# Patient Record
Sex: Female | Born: 1980 | Race: Black or African American | Hispanic: No | Marital: Single | Smoking: Never smoker
Health system: Southern US, Community
[De-identification: ages and names within clinical notes are randomized; demographics above are authoritative.]

## PROBLEM LIST (undated history)

## (undated) DIAGNOSIS — I82409 Acute embolism and thrombosis of unspecified deep veins of unspecified lower extremity: Secondary | ICD-10-CM

## (undated) DIAGNOSIS — E1110 Type 2 diabetes mellitus with ketoacidosis without coma: Secondary | ICD-10-CM

## (undated) DIAGNOSIS — N7092 Oophoritis, unspecified: Secondary | ICD-10-CM

## (undated) DIAGNOSIS — F99 Mental disorder, not otherwise specified: Secondary | ICD-10-CM

## (undated) DIAGNOSIS — K7689 Other specified diseases of liver: Secondary | ICD-10-CM

## (undated) DIAGNOSIS — IMO0002 Reserved for concepts with insufficient information to code with codable children: Secondary | ICD-10-CM

## (undated) DIAGNOSIS — N10 Acute tubulo-interstitial nephritis: Secondary | ICD-10-CM

## (undated) DIAGNOSIS — K5289 Other specified noninfective gastroenteritis and colitis: Secondary | ICD-10-CM

## (undated) DIAGNOSIS — I10 Essential (primary) hypertension: Secondary | ICD-10-CM

## (undated) DIAGNOSIS — N390 Urinary tract infection, site not specified: Secondary | ICD-10-CM

## (undated) DIAGNOSIS — J869 Pyothorax without fistula: Secondary | ICD-10-CM

## (undated) DIAGNOSIS — N739 Female pelvic inflammatory disease, unspecified: Secondary | ICD-10-CM

## (undated) HISTORY — PX: CHOLECYSTECTOMY: SHX55

---

## 2008-11-14 HISTORY — PX: OTHER SURGICAL HISTORY: SHX169

## 2009-03-01 ENCOUNTER — Inpatient Hospital Stay (HOSPITAL_COMMUNITY): Admission: EM | Admit: 2009-03-01 | Discharge: 2009-03-10 | Payer: Self-pay | Admitting: Emergency Medicine

## 2009-03-01 DIAGNOSIS — K7689 Other specified diseases of liver: Secondary | ICD-10-CM | POA: Insufficient documentation

## 2009-03-01 DIAGNOSIS — D509 Iron deficiency anemia, unspecified: Secondary | ICD-10-CM

## 2009-03-01 DIAGNOSIS — N739 Female pelvic inflammatory disease, unspecified: Secondary | ICD-10-CM

## 2009-03-01 DIAGNOSIS — E1165 Type 2 diabetes mellitus with hyperglycemia: Secondary | ICD-10-CM

## 2009-03-01 HISTORY — DX: Other specified diseases of liver: K76.89

## 2009-03-01 HISTORY — DX: Female pelvic inflammatory disease, unspecified: N73.9

## 2009-03-03 ENCOUNTER — Ambulatory Visit: Payer: Self-pay | Admitting: Vascular Surgery

## 2009-03-04 ENCOUNTER — Encounter (INDEPENDENT_AMBULATORY_CARE_PROVIDER_SITE_OTHER): Payer: Self-pay | Admitting: Internal Medicine

## 2009-03-16 DIAGNOSIS — J869 Pyothorax without fistula: Secondary | ICD-10-CM

## 2009-03-16 HISTORY — DX: Pyothorax without fistula: J86.9

## 2009-03-17 ENCOUNTER — Inpatient Hospital Stay (HOSPITAL_COMMUNITY): Admission: EM | Admit: 2009-03-17 | Discharge: 2009-03-18 | Payer: Self-pay | Admitting: Emergency Medicine

## 2009-03-18 ENCOUNTER — Encounter (INDEPENDENT_AMBULATORY_CARE_PROVIDER_SITE_OTHER): Payer: Self-pay | Admitting: Internal Medicine

## 2009-03-18 ENCOUNTER — Ambulatory Visit: Payer: Self-pay | Admitting: Infectious Diseases

## 2009-03-30 ENCOUNTER — Encounter (INDEPENDENT_AMBULATORY_CARE_PROVIDER_SITE_OTHER): Payer: Self-pay | Admitting: Internal Medicine

## 2009-03-30 ENCOUNTER — Telehealth (INDEPENDENT_AMBULATORY_CARE_PROVIDER_SITE_OTHER): Payer: Self-pay | Admitting: Internal Medicine

## 2009-03-30 ENCOUNTER — Ambulatory Visit: Payer: Self-pay | Admitting: Nurse Practitioner

## 2009-03-30 LAB — CONVERTED CEMR LAB
INR: 1.4 (ref 0.0–1.5)
Prothrombin Time: 17.4 s — ABNORMAL HIGH (ref 11.6–15.2)

## 2009-04-02 ENCOUNTER — Ambulatory Visit: Payer: Self-pay | Admitting: Internal Medicine

## 2009-04-02 ENCOUNTER — Ambulatory Visit: Payer: Self-pay | Admitting: *Deleted

## 2009-04-02 DIAGNOSIS — I82409 Acute embolism and thrombosis of unspecified deep veins of unspecified lower extremity: Secondary | ICD-10-CM

## 2009-04-02 HISTORY — DX: Acute embolism and thrombosis of unspecified deep veins of unspecified lower extremity: I82.409

## 2009-04-02 LAB — CONVERTED CEMR LAB: Blood Glucose, Fingerstick: 234

## 2009-04-06 ENCOUNTER — Ambulatory Visit: Payer: Self-pay | Admitting: Internal Medicine

## 2009-04-14 HISTORY — PX: OVARIAN CYST SURGERY: SHX726

## 2009-04-14 HISTORY — PX: SMALL INTESTINE SURGERY: SHX150

## 2009-04-16 ENCOUNTER — Encounter (INDEPENDENT_AMBULATORY_CARE_PROVIDER_SITE_OTHER): Payer: Self-pay | Admitting: Internal Medicine

## 2009-04-16 LAB — CONVERTED CEMR LAB
Eosinophils Absolute: 0.2 10*3/uL (ref 0.0–0.7)
Eosinophils Relative: 2 % (ref 0–5)
HCT: 31.4 % — ABNORMAL LOW (ref 36.0–46.0)
Lymphocytes Relative: 20 % (ref 12–46)
MCHC: 30.3 g/dL (ref 30.0–36.0)
MCV: 87 fL (ref 78.0–100.0)
Monocytes Relative: 8 % (ref 3–12)
Neutro Abs: 5.9 10*3/uL (ref 1.7–7.7)
Neutrophils Relative %: 70 % (ref 43–77)
RDW: 16.5 % — ABNORMAL HIGH (ref 11.5–15.5)

## 2009-04-30 ENCOUNTER — Encounter (INDEPENDENT_AMBULATORY_CARE_PROVIDER_SITE_OTHER): Payer: Self-pay | Admitting: Internal Medicine

## 2009-05-11 ENCOUNTER — Telehealth (INDEPENDENT_AMBULATORY_CARE_PROVIDER_SITE_OTHER): Payer: Self-pay | Admitting: Internal Medicine

## 2009-05-12 ENCOUNTER — Emergency Department (HOSPITAL_COMMUNITY): Admission: EM | Admit: 2009-05-12 | Discharge: 2009-05-12 | Payer: Self-pay | Admitting: Emergency Medicine

## 2009-05-12 ENCOUNTER — Encounter (INDEPENDENT_AMBULATORY_CARE_PROVIDER_SITE_OTHER): Payer: Self-pay | Admitting: Internal Medicine

## 2009-05-22 ENCOUNTER — Emergency Department (HOSPITAL_COMMUNITY): Admission: EM | Admit: 2009-05-22 | Discharge: 2009-05-23 | Payer: Self-pay | Admitting: Emergency Medicine

## 2009-06-01 ENCOUNTER — Encounter (INDEPENDENT_AMBULATORY_CARE_PROVIDER_SITE_OTHER): Payer: Self-pay | Admitting: Internal Medicine

## 2009-06-16 ENCOUNTER — Encounter (INDEPENDENT_AMBULATORY_CARE_PROVIDER_SITE_OTHER): Payer: Self-pay | Admitting: Internal Medicine

## 2009-06-16 ENCOUNTER — Telehealth (INDEPENDENT_AMBULATORY_CARE_PROVIDER_SITE_OTHER): Payer: Self-pay | Admitting: Internal Medicine

## 2009-06-18 ENCOUNTER — Encounter (INDEPENDENT_AMBULATORY_CARE_PROVIDER_SITE_OTHER): Payer: Self-pay | Admitting: Internal Medicine

## 2009-06-19 ENCOUNTER — Encounter (INDEPENDENT_AMBULATORY_CARE_PROVIDER_SITE_OTHER): Payer: Self-pay | Admitting: Internal Medicine

## 2009-06-26 ENCOUNTER — Encounter (INDEPENDENT_AMBULATORY_CARE_PROVIDER_SITE_OTHER): Payer: Self-pay | Admitting: Internal Medicine

## 2009-06-29 ENCOUNTER — Telehealth (INDEPENDENT_AMBULATORY_CARE_PROVIDER_SITE_OTHER): Payer: Self-pay | Admitting: Internal Medicine

## 2009-07-03 ENCOUNTER — Encounter (INDEPENDENT_AMBULATORY_CARE_PROVIDER_SITE_OTHER): Payer: Self-pay | Admitting: Internal Medicine

## 2009-07-04 LAB — CONVERTED CEMR LAB
ALT: 13 units/L (ref 0–35)
AST: 10 units/L (ref 0–37)
AST: 13 units/L (ref 0–37)
AST: 11 units/L (ref 0–37)
Albumin: 4.1 g/dL (ref 3.5–5.2)
Alkaline Phosphatase: 85 units/L (ref 39–117)
BUN: 5 mg/dL — ABNORMAL LOW (ref 6–23)
Basophils Absolute: 0 10*3/uL (ref 0.0–0.1)
Basophils Absolute: 0 10*3/uL (ref 0.0–0.1)
Basophils Relative: 0 % (ref 0–1)
Bilirubin, Direct: 0.1 mg/dL (ref 0.0–0.3)
Calcium: 9.4 mg/dL (ref 8.4–10.5)
Chloride: 100 meq/L (ref 96–112)
Chloride: 102 meq/L (ref 96–112)
Creatinine, Ser: 0.61 mg/dL (ref 0.40–1.20)
Creatinine, Ser: 0.6 mg/dL (ref 0.40–1.20)
Eosinophils Relative: 1 % (ref 0–5)
Eosinophils Relative: 1 % (ref 0–5)
Eosinophils Relative: 1 % (ref 0–5)
Glucose, Bld: 184 mg/dL — ABNORMAL HIGH (ref 70–99)
Hemoglobin: 10.7 g/dL — ABNORMAL LOW (ref 12.0–15.0)
Hemoglobin: 10.7 g/dL — ABNORMAL LOW (ref 12.0–15.0)
Hemoglobin: 11.4 g/dL — ABNORMAL LOW (ref 12.0–15.0)
Indirect Bilirubin: 0.2 mg/dL (ref 0.0–0.9)
Lymphocytes Relative: 20 % (ref 12–46)
Lymphs Abs: 1.7 10*3/uL (ref 0.7–4.0)
MCHC: 30.7 g/dL (ref 30.0–36.0)
MCHC: 31.4 g/dL (ref 30.0–36.0)
MCHC: 32.6 g/dL (ref 30.0–36.0)
MCV: 85.3 fL (ref 78.0–100.0)
MCV: 85.5 fL (ref 78.0–100.0)
MCV: 83.9 fL (ref 78.0–100.0)
Monocytes Absolute: 0.5 10*3/uL (ref 0.1–1.0)
Monocytes Absolute: 0.6 10*3/uL (ref 0.1–1.0)
Monocytes Relative: 6 % (ref 3–12)
Monocytes Relative: 7 % (ref 3–12)
Neutro Abs: 6.6 10*3/uL (ref 1.7–7.7)
Neutro Abs: 5.8 10*3/uL (ref 1.7–7.7)
Neutrophils Relative %: 71 % (ref 43–77)
Neutrophils Relative %: 73 % (ref 43–77)
Platelets: 438 10*3/uL — ABNORMAL HIGH (ref 150–400)
RBC: 3.99 M/uL (ref 3.87–5.11)
RBC: 4.17 M/uL (ref 3.87–5.11)
RDW: 17.4 % — ABNORMAL HIGH (ref 11.5–15.5)
RDW: 16.6 % — ABNORMAL HIGH (ref 11.5–15.5)
Sodium: 139 meq/L (ref 135–145)
Total Bilirubin: 0.5 mg/dL (ref 0.3–1.2)
Total Bilirubin: 0.3 mg/dL (ref 0.3–1.2)
Total CK: 11 units/L (ref 7–177)
Total CK: 17 units/L (ref 7–177)
Total Protein: 8 g/dL (ref 6.0–8.3)
WBC: 7.8 10*3/uL (ref 4.0–10.5)
WBC: 9 10*3/uL (ref 4.0–10.5)
WBC: 8.6 10*3/uL (ref 4.0–10.5)

## 2009-07-16 ENCOUNTER — Telehealth (INDEPENDENT_AMBULATORY_CARE_PROVIDER_SITE_OTHER): Payer: Self-pay | Admitting: Internal Medicine

## 2009-07-17 ENCOUNTER — Encounter (INDEPENDENT_AMBULATORY_CARE_PROVIDER_SITE_OTHER): Payer: Self-pay | Admitting: Internal Medicine

## 2009-07-17 LAB — CONVERTED CEMR LAB
AST: 12 units/L (ref 0–37)
Alkaline Phosphatase: 92 units/L (ref 39–117)
BUN: 9 mg/dL (ref 6–23)
Basophils Absolute: 0 10*3/uL (ref 0.0–0.1)
Basophils Relative: 0 % (ref 0–1)
Bilirubin, Direct: 0.1 mg/dL (ref 0.0–0.3)
CO2: 22 meq/L (ref 19–32)
Chloride: 101 meq/L (ref 96–112)
Eosinophils Absolute: 0.1 10*3/uL (ref 0.0–0.7)
Hemoglobin: 11.5 g/dL — ABNORMAL LOW (ref 12.0–15.0)
MCHC: 31.3 g/dL (ref 30.0–36.0)
MCV: 87 fL (ref 78.0–100.0)
Monocytes Absolute: 0.5 10*3/uL (ref 0.1–1.0)
Platelets: 510 10*3/uL — ABNORMAL HIGH (ref 150–400)
Sodium: 139 meq/L (ref 135–145)
Total CK: 30 units/L (ref 7–177)
WBC: 7.1 10*3/uL (ref 4.0–10.5)

## 2009-07-21 ENCOUNTER — Encounter (INDEPENDENT_AMBULATORY_CARE_PROVIDER_SITE_OTHER): Payer: Self-pay | Admitting: Internal Medicine

## 2009-07-28 ENCOUNTER — Encounter (INDEPENDENT_AMBULATORY_CARE_PROVIDER_SITE_OTHER): Payer: Self-pay | Admitting: Internal Medicine

## 2009-07-31 ENCOUNTER — Encounter (INDEPENDENT_AMBULATORY_CARE_PROVIDER_SITE_OTHER): Payer: Self-pay | Admitting: Internal Medicine

## 2009-08-08 ENCOUNTER — Encounter (INDEPENDENT_AMBULATORY_CARE_PROVIDER_SITE_OTHER): Payer: Self-pay | Admitting: Internal Medicine

## 2009-08-09 ENCOUNTER — Encounter (INDEPENDENT_AMBULATORY_CARE_PROVIDER_SITE_OTHER): Payer: Self-pay | Admitting: Internal Medicine

## 2009-08-09 DIAGNOSIS — IMO0002 Reserved for concepts with insufficient information to code with codable children: Secondary | ICD-10-CM

## 2009-08-09 HISTORY — DX: Reserved for concepts with insufficient information to code with codable children: IMO0002

## 2009-08-10 ENCOUNTER — Encounter (INDEPENDENT_AMBULATORY_CARE_PROVIDER_SITE_OTHER): Payer: Self-pay | Admitting: Internal Medicine

## 2009-10-02 ENCOUNTER — Ambulatory Visit: Payer: Self-pay | Admitting: Internal Medicine

## 2009-10-02 DIAGNOSIS — K5289 Other specified noninfective gastroenteritis and colitis: Secondary | ICD-10-CM

## 2009-10-02 HISTORY — DX: Other specified noninfective gastroenteritis and colitis: K52.89

## 2009-10-07 LAB — CONVERTED CEMR LAB
Albumin: 4.7 g/dL (ref 3.5–5.2)
Alkaline Phosphatase: 91 units/L (ref 39–117)
BUN: 14 mg/dL (ref 6–23)
Basophils Absolute: 0 10*3/uL (ref 0.0–0.1)
Basophils Relative: 0 % (ref 0–1)
Creatinine, Ser: 0.82 mg/dL (ref 0.40–1.20)
Eosinophils Absolute: 0 10*3/uL (ref 0.0–0.7)
Eosinophils Relative: 1 % (ref 0–5)
Glucose, Bld: 100 mg/dL — ABNORMAL HIGH (ref 70–99)
HCT: 38.2 % (ref 36.0–46.0)
Hemoglobin: 12.6 g/dL (ref 12.0–15.0)
Lymphocytes Relative: 26 % (ref 12–46)
MCHC: 33 g/dL (ref 30.0–36.0)
MCV: 86.2 fL (ref 78.0–100.0)
Monocytes Absolute: 0.5 10*3/uL (ref 0.1–1.0)
Platelets: 310 10*3/uL (ref 150–400)
Potassium: 4.9 meq/L (ref 3.5–5.3)
RDW: 14.1 % (ref 11.5–15.5)

## 2009-11-12 ENCOUNTER — Ambulatory Visit: Payer: Self-pay | Admitting: Internal Medicine

## 2009-11-14 DIAGNOSIS — N739 Female pelvic inflammatory disease, unspecified: Secondary | ICD-10-CM

## 2009-11-14 HISTORY — DX: Female pelvic inflammatory disease, unspecified: N73.9

## 2009-11-16 LAB — CONVERTED CEMR LAB
C-Peptide: 4.19 ng/mL — ABNORMAL HIGH (ref 0.80–3.90)
Hgb A1c MFr Bld: 7.5 % — ABNORMAL HIGH (ref 4.6–6.1)

## 2009-11-29 ENCOUNTER — Encounter (INDEPENDENT_AMBULATORY_CARE_PROVIDER_SITE_OTHER): Payer: Self-pay | Admitting: Internal Medicine

## 2009-12-01 ENCOUNTER — Encounter (INDEPENDENT_AMBULATORY_CARE_PROVIDER_SITE_OTHER): Payer: Self-pay | Admitting: Internal Medicine

## 2009-12-11 ENCOUNTER — Inpatient Hospital Stay (HOSPITAL_COMMUNITY): Admission: EM | Admit: 2009-12-11 | Discharge: 2009-12-12 | Payer: Self-pay | Admitting: Emergency Medicine

## 2009-12-14 ENCOUNTER — Encounter (INDEPENDENT_AMBULATORY_CARE_PROVIDER_SITE_OTHER): Payer: Self-pay | Admitting: Internal Medicine

## 2009-12-15 ENCOUNTER — Encounter (INDEPENDENT_AMBULATORY_CARE_PROVIDER_SITE_OTHER): Payer: Self-pay | Admitting: Internal Medicine

## 2009-12-24 ENCOUNTER — Telehealth (INDEPENDENT_AMBULATORY_CARE_PROVIDER_SITE_OTHER): Payer: Self-pay | Admitting: Internal Medicine

## 2010-02-16 ENCOUNTER — Inpatient Hospital Stay (HOSPITAL_COMMUNITY): Admission: EM | Admit: 2010-02-16 | Discharge: 2010-02-22 | Payer: Self-pay | Admitting: Emergency Medicine

## 2010-02-16 ENCOUNTER — Encounter (INDEPENDENT_AMBULATORY_CARE_PROVIDER_SITE_OTHER): Payer: Self-pay | Admitting: Internal Medicine

## 2010-02-16 DIAGNOSIS — N10 Acute tubulo-interstitial nephritis: Secondary | ICD-10-CM

## 2010-02-16 HISTORY — DX: Acute pyelonephritis: N10

## 2010-08-24 IMAGING — CR DG CHEST 2V
3 series · 3 of 3 positions shown · non-contrast
Comparison: None

CLINICAL DATA: Evaluate for pulmonary embolism

CHEST - 2 VIEW

[w chest lat * (1 of 2)]
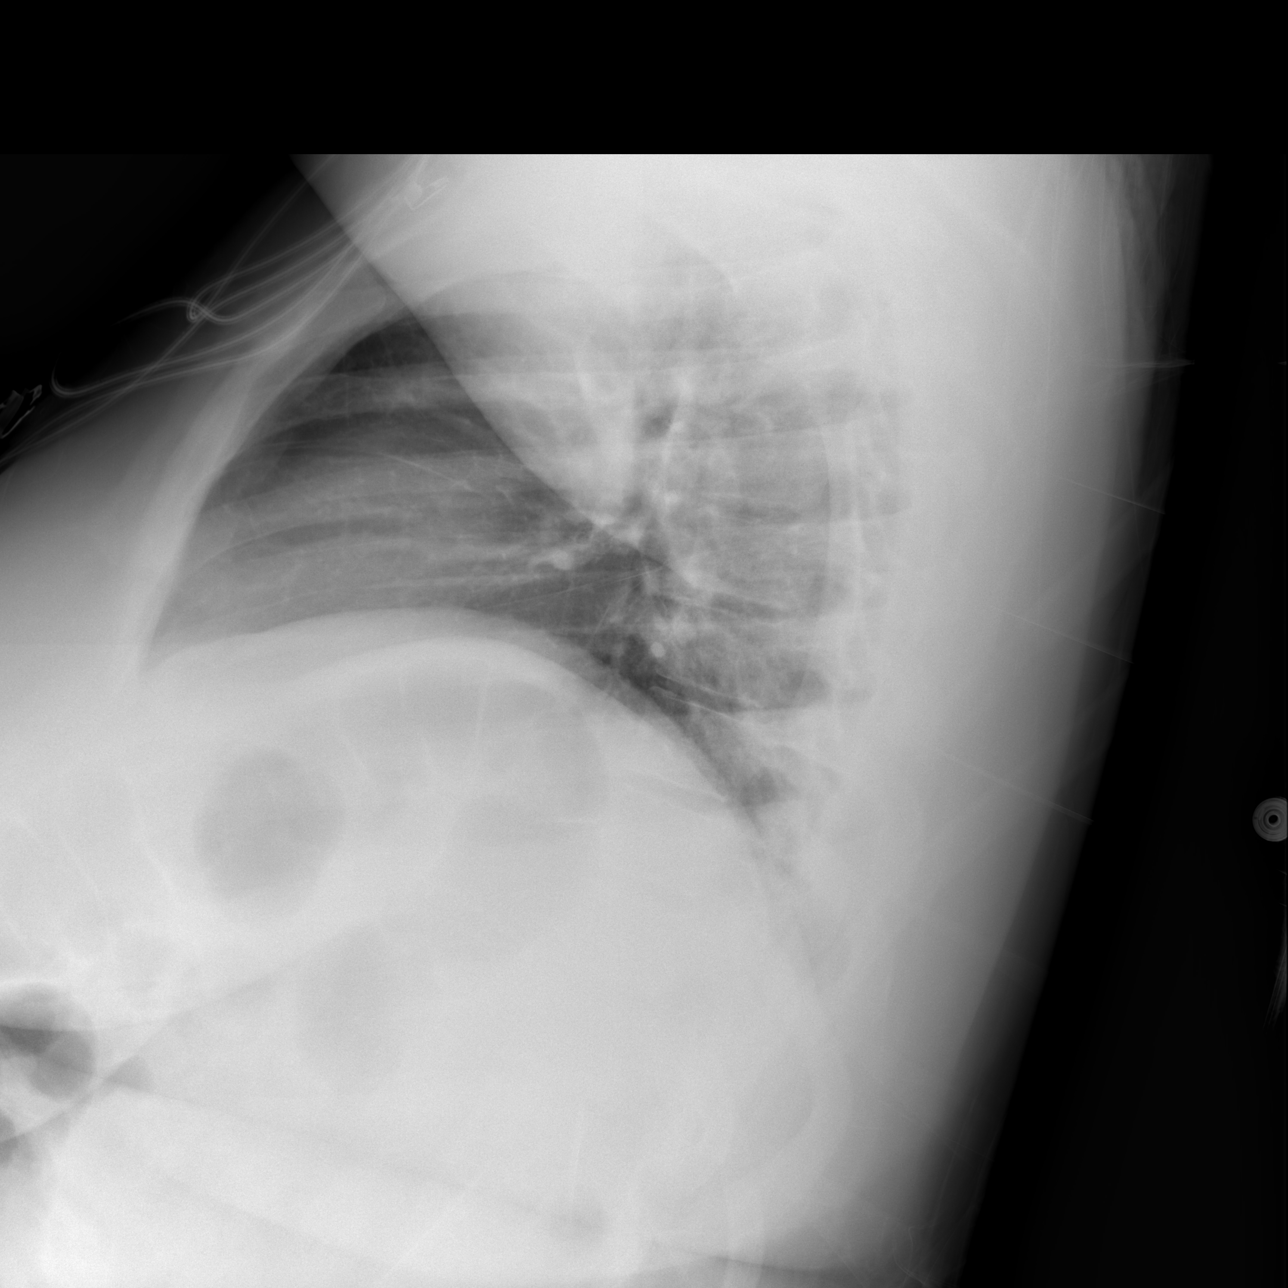

[w chest lat * (2 of 2)]
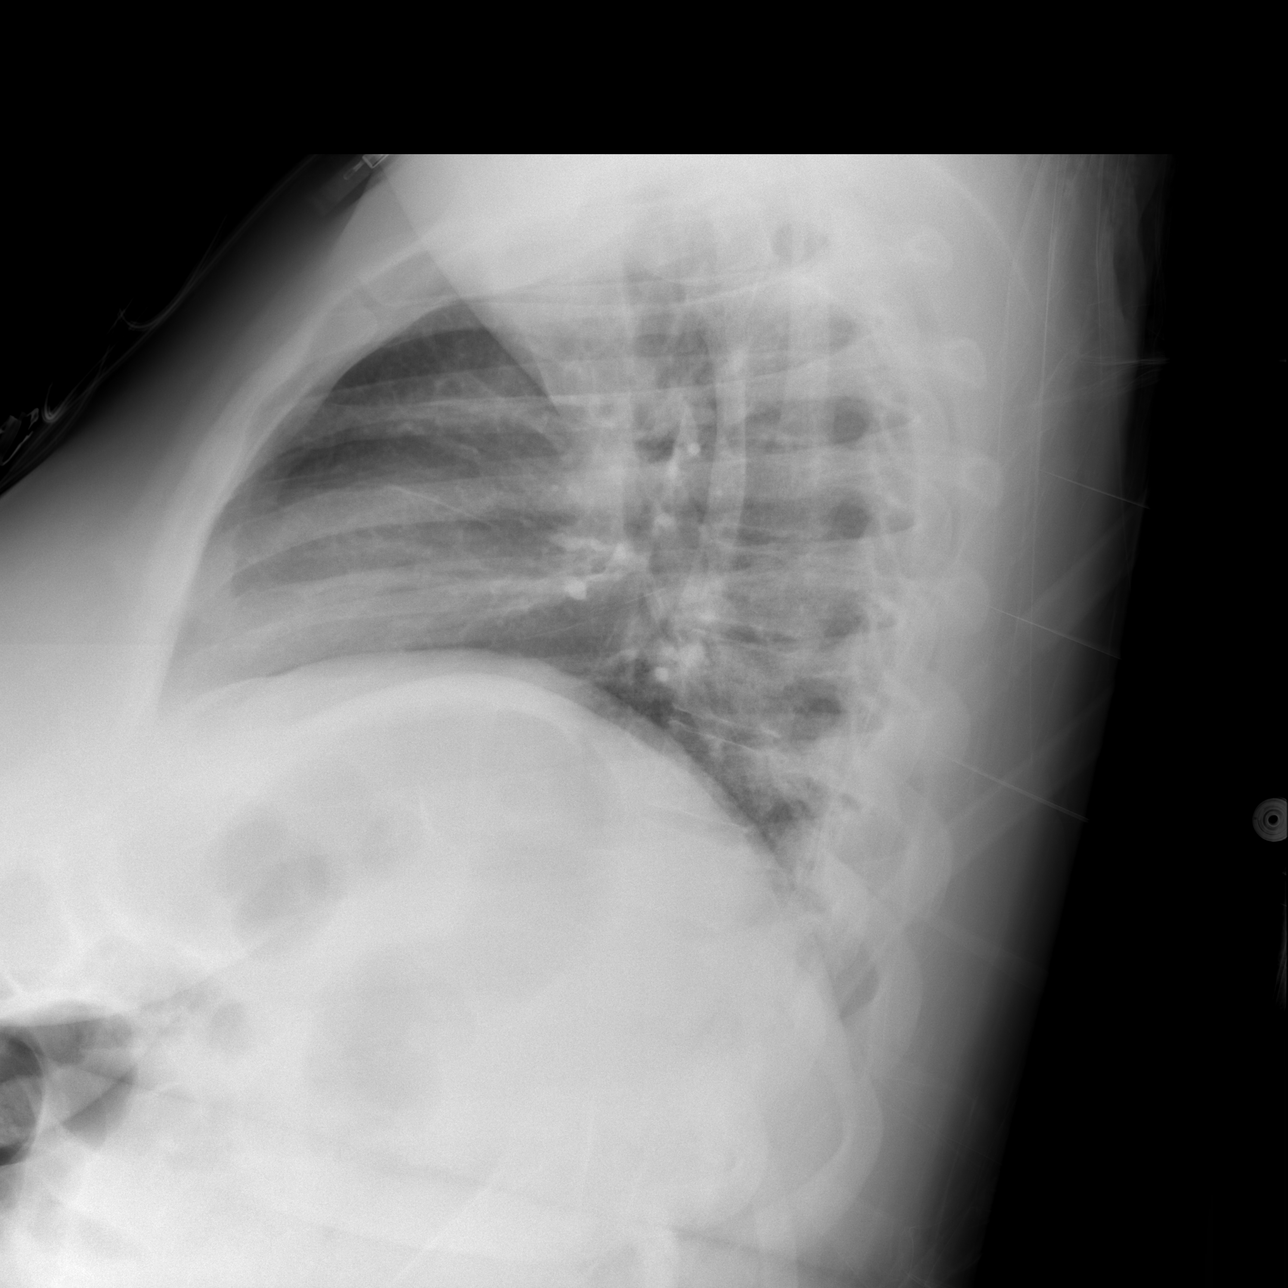

[view not recorded]
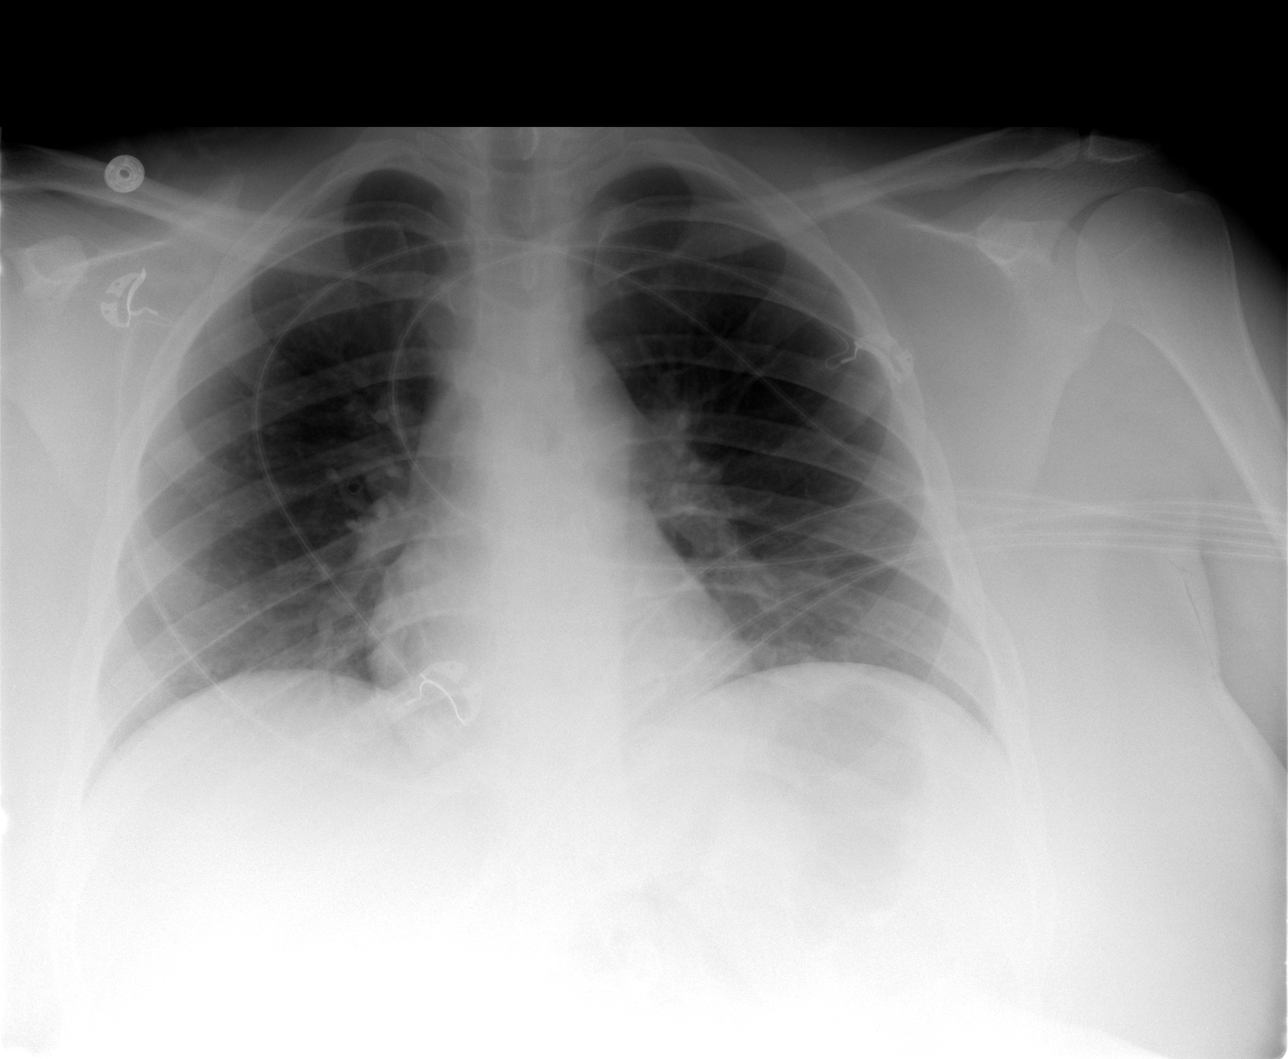

[3 of 3 positions shown; findings below may reference images not displayed]

FINDINGS: Heart size is normal.

There is no pleural effusion or pulmonary edema.

There is no airspace densities identified.
IMPRESSION: 1.  No acute cardiopulmonary abnormalities.

## 2010-08-27 IMAGING — XA IR [PERSON_NAME]/[PERSON_NAME]
1 series · 14 of 23 positions shown · non-contrast
Comparison: none

INDICATION: 27-year-old female with a right lower extremity DVT.
The patient has a pelvic mass and the DVT may be secondary to
compression on the right iliac veins.  The patient is anemic and
refuses blood transfusions.  The patient is no longer a candidate
for anticoagulation.

[Series 1000: run · 0.23mm/px · 14 of 23 slices shown]
[im 1/23]
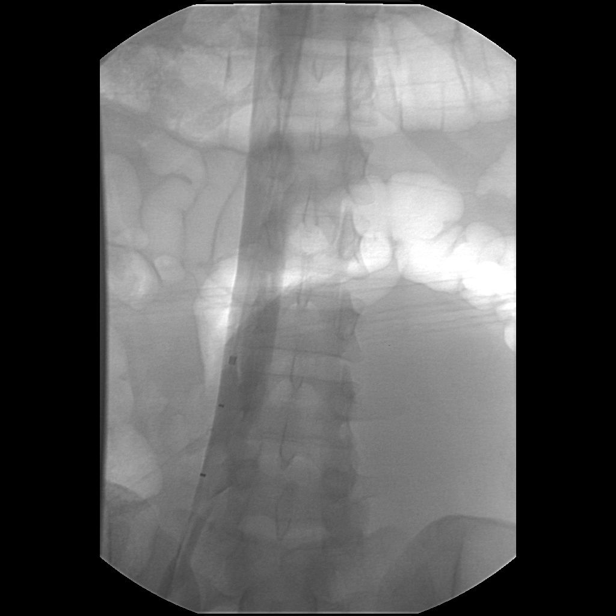
[im 3/23]
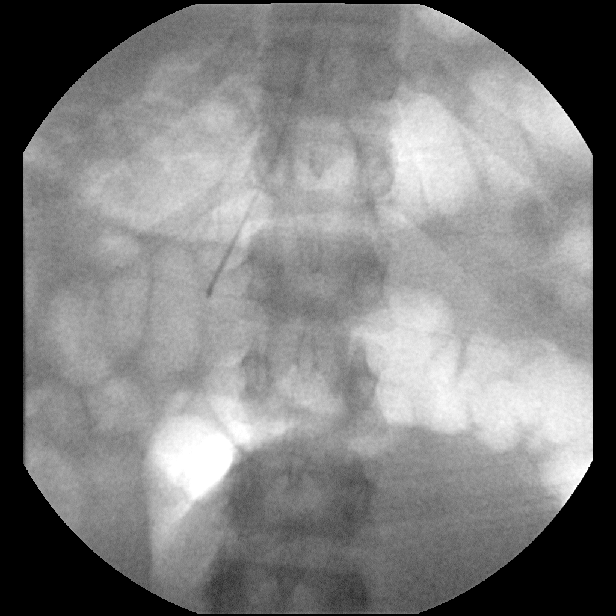
[im 5/23]
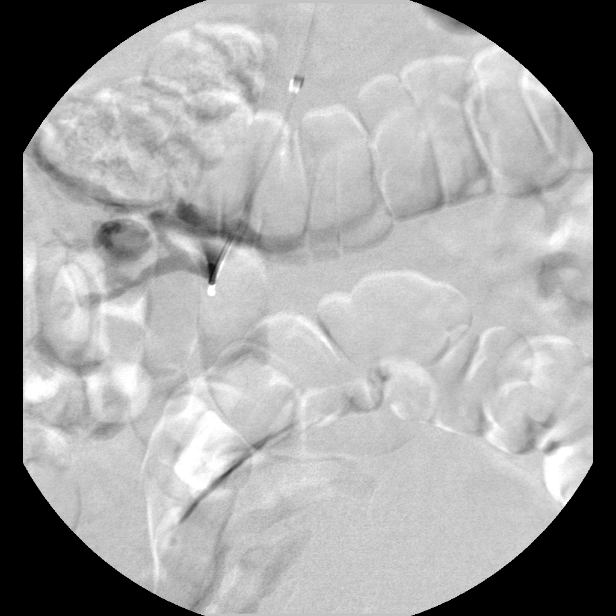
[im 6/23]
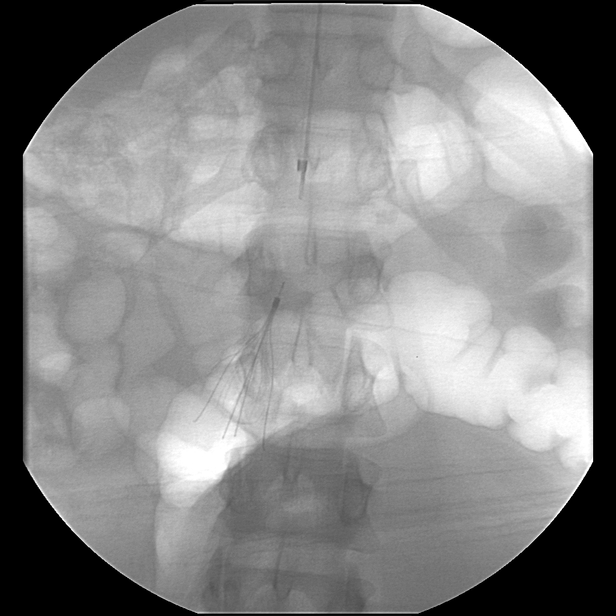
[im 8/23]
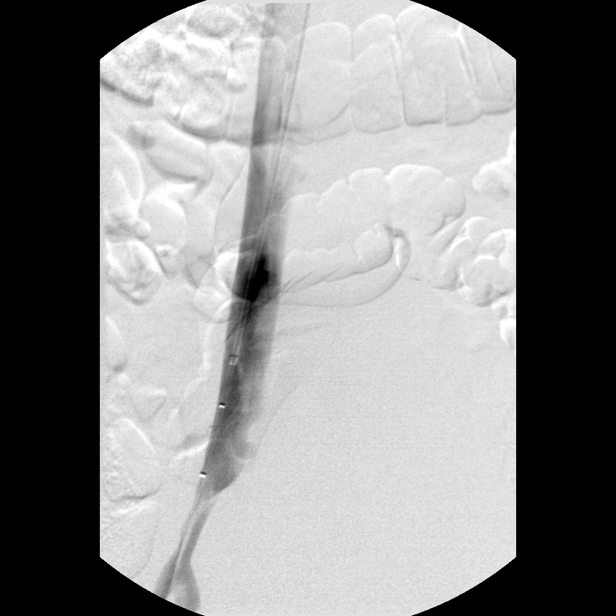
[im 10/23]
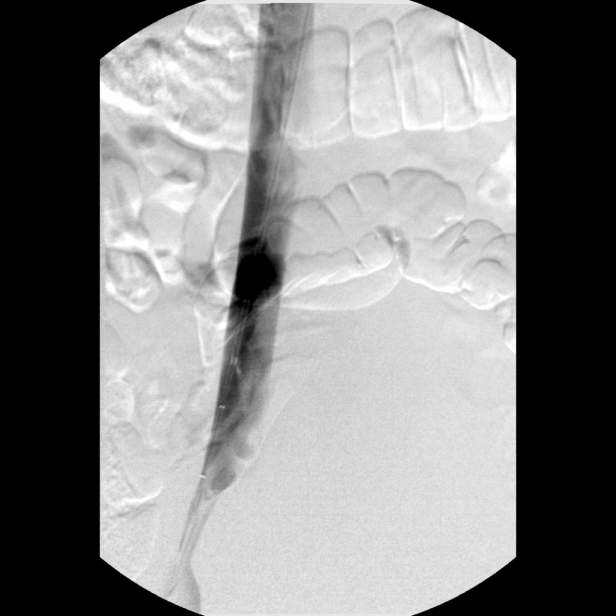
[im 11/23]
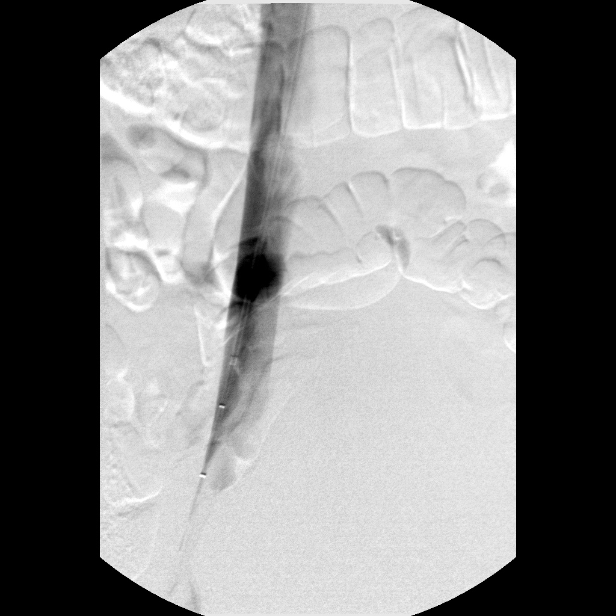
[im 13/23]
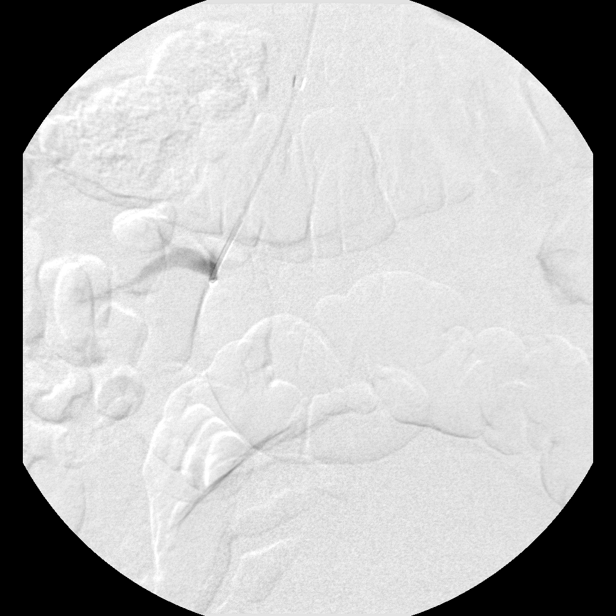
[im 14/23]
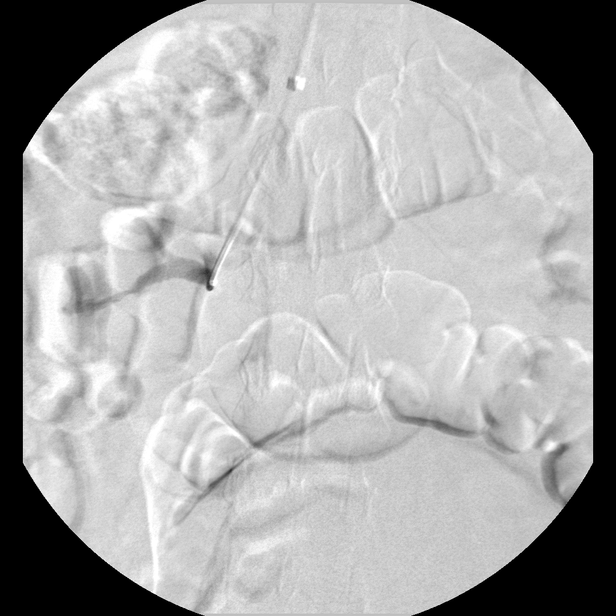
[im 16/23]
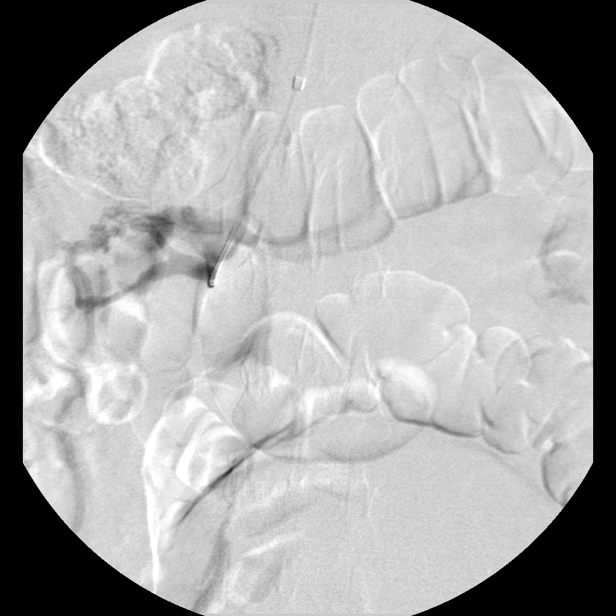
[im 18/23]
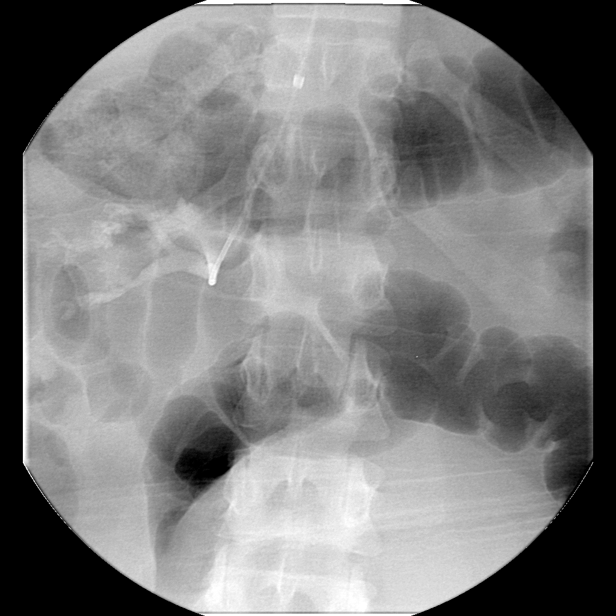
[im 19/23]
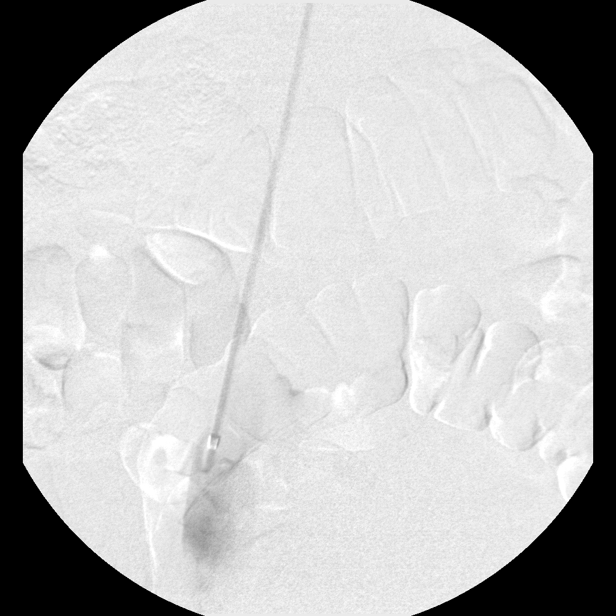
[im 21/23]
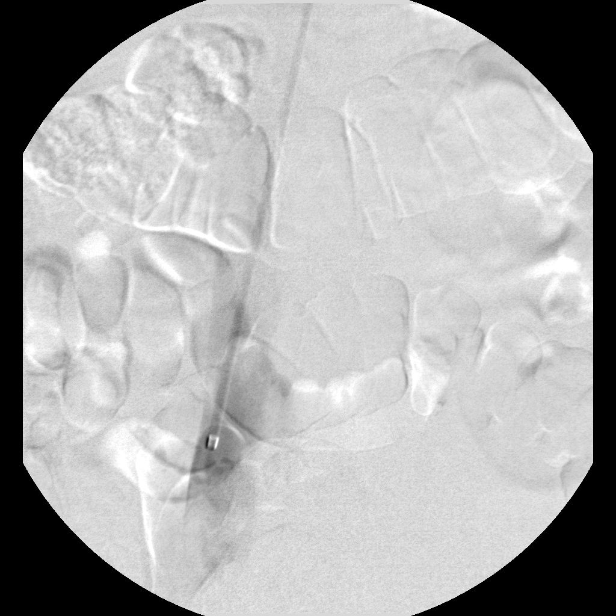
[im 23/23]
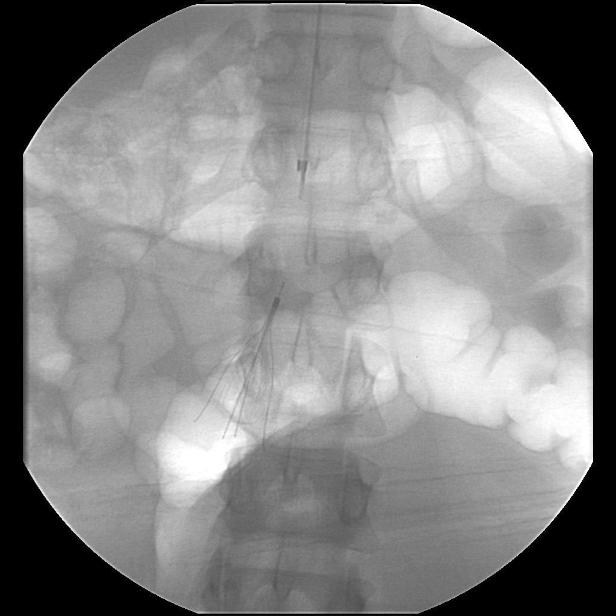

[14 of 23 positions shown; findings below may reference images not displayed]

PROCEDURE(S): IVC FILTER PLACEMENT; IVC VENOGRAM; ULTRASOUND FOR
VASCULAR ACCESS

Medications: Versed 4 mg, Fentanyl 200 mcg

Sedation time: 30 minutes

Fluoroscopy time: 6.6 minutes

Contrast:  50 ml

Procedure:Informed consent was obtained for an IVC venogram and
filter placement.  Ultrasound demonstrated a patent right internal
jugular vein.  Ultrasound images were obtained for documentation.
The  was prepped and draped in a sterile fashion.  The skin was
anesthetized with 1% lidocaine.  A 21 gauge needle was directed
into the vein with ultrasound guidance and a micropuncture dilator
set was placed.  A wire was advanced into the IVC.  The filter
sheath was advanced over the wire into the IVC.  An IVC venogram
was performed.  Fluoroscopic images were obtained for
documentation.  The renal veins were selected with Iranzi Pascasie
catheter and a right renal vein angiogram was performed to confirm
location.  A Celect filter was deployed below the lowest renal
vein. The vascular sheath was removed with manual compression.
FINDINGS: IVC is patent.  Renal veins were difficult to evaluate and
were selected with Iranzi Pascasie catheter.  The right renal vein was
injected with contrast.  IVC measured 23 mm below the renal veins.
This Celect filter was deployed below the renal veins.  The filter
was slightly tilted towards the left side.  The degree of tilt
measures 10-15%.
IMPRESSION: Successful placement of a retrievable IVC filter.

## 2010-11-02 ENCOUNTER — Emergency Department (HOSPITAL_COMMUNITY)
Admission: EM | Admit: 2010-11-02 | Discharge: 2010-11-02 | Payer: Self-pay | Source: Home / Self Care | Admitting: Emergency Medicine

## 2010-12-03 ENCOUNTER — Ambulatory Visit
Admission: RE | Admit: 2010-12-03 | Discharge: 2010-12-03 | Payer: Self-pay | Source: Home / Self Care | Attending: Internal Medicine | Admitting: Internal Medicine

## 2010-12-03 DIAGNOSIS — I10 Essential (primary) hypertension: Secondary | ICD-10-CM

## 2010-12-03 DIAGNOSIS — N390 Urinary tract infection, site not specified: Secondary | ICD-10-CM

## 2010-12-03 HISTORY — DX: Urinary tract infection, site not specified: N39.0

## 2010-12-03 HISTORY — DX: Essential (primary) hypertension: I10

## 2010-12-03 LAB — CONVERTED CEMR LAB
Blood Glucose, Fingerstick: 263
Glucose, Urine, Semiquant: NEGATIVE
Urobilinogen, UA: 0.2
pH: 5

## 2010-12-04 ENCOUNTER — Encounter (INDEPENDENT_AMBULATORY_CARE_PROVIDER_SITE_OTHER): Payer: Self-pay | Admitting: Internal Medicine

## 2010-12-06 ENCOUNTER — Encounter (INDEPENDENT_AMBULATORY_CARE_PROVIDER_SITE_OTHER): Payer: Self-pay | Admitting: Internal Medicine

## 2010-12-14 NOTE — Progress Notes (Signed)
Summary: Recurrence of pelvic abscesses--hospitatlized  Phone Note From Other Clinic   Summary of Call: Pt. 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 Initial call taken by: Mack Hook MD,  December 24, 2009 7:03 AM

## 2010-12-14 NOTE — Consult Note (Signed)
Summary: ENTEROCUTANEOUS FISTULA  ENTEROCUTANEOUS FISTULA   Imported By: Roland Earl 04/07/2010 10:29:35  _____________________________________________________________________  External Attachment:    Type:   Image     Comment:   External Document

## 2010-12-14 NOTE — Letter (Signed)
Summary: report  from [place]  report  from [place] By: Roland Earl 02/15/2010 16:49:57  _____________________________________________________________________  External Attachment:    Type:   Image     Comment:   External Document

## 2010-12-14 NOTE — Letter (Signed)
Summary: SOAP INPATIENT PROGRESS NOTE  SOAP INPATIENT PROGRESS NOTE   Imported By: Roland Earl 01/01/2010 15:35:27  _____________________________________________________________________  External Attachment:    Type:   Image     Comment:   External Document

## 2010-12-14 NOTE — Miscellaneous (Signed)
  Clinical Lists Changes  Medications: Removed medication of LOVENOX 40 MG/0.4ML SOLN (ENOXAPARIN SODIUM) inject 40 mg subcutaneously daily

## 2010-12-14 NOTE — Letter (Signed)
Summary: REPORT OF MEDICAL EXAM/[facility] [employer]  REPORT OF MEDICAL EXAM/[facility] [employer]   Imported By: Roland Earl 12/01/2009 11:31:37  _____________________________________________________________________  External Attachment:    Type:   Image     Comment:   External Document

## 2010-12-14 NOTE — Letter (Signed)
Summary: FOLLOWUP INPATIENT CONSULT NOTE  FOLLOWUP INPATIENT CONSULT NOTE   Imported By: Roland Earl 01/01/2010 15:31:15  _____________________________________________________________________  External Attachment:    Type:   Image     Comment:   External Document

## 2010-12-14 NOTE — Letter (Signed)
Summary: DEPARTMENT OF MEDICINE   DEPARTMENT OF MEDICINE   Imported By: Roland Earl 01/01/2010 15:29:28  _____________________________________________________________________  External Attachment:    Type:   Image     Comment:   External Document

## 2010-12-14 NOTE — Letter (Signed)
Summary: NUTRITION & DIABETES MANAGMENT  NUTRITION & DIABETES MANAGMENT   Imported By: Roland Earl 02/11/2010 15:32:24  _____________________________________________________________________  External Attachment:    Type:   Image     Comment:   External Document

## 2010-12-14 NOTE — Letter (Signed)
Summary: FOLLOWUP INPATIENT CONSULT NOTE  FOLLOWUP INPATIENT CONSULT NOTE   Imported By: Roland Earl 01/01/2010 15:34:23  _____________________________________________________________________  External Attachment:    Type:   Image     Comment:   External Document

## 2010-12-14 NOTE — Letter (Signed)
Summary: OBSTETRICS &GYNECOLOGY  OBSTETRICS &GYNECOLOGY   Imported By: Roland Earl 01/01/2010 15:27:59  _____________________________________________________________________  External Attachment:    Type:   Image     Comment:   External Document

## 2010-12-14 NOTE — Letter (Signed)
Summary: Discharge Summary//[facility]  Discharge Summary//[facility]   Imported By: Roland Earl 01/08/2010 12:58:47  _____________________________________________________________________  External Attachment:    Type:   Image     Comment:   External Document

## 2010-12-16 NOTE — Assessment & Plan Note (Signed)
Summary: FU FROM ER/DRAINIAGE FROM ABCESS/[facility]   Vital Signs:  Patient profile:   30 year old female Menstrual status:  regular LMP:     11/23/2010 Weight:      283.06 pounds BMI:     44.49 Temp:     98.9 degrees F oral Pulse rate:   96 / minute Pulse rhythm:   regular Resp:     14 per minute BP sitting:   120 / 92  (left arm) Cuff size:   large  Vitals Entered By: [place] (December 03, 2010 11:00 AM) CC: F/u from [facility] ER for abcess x2 months. There is occasional drainage. Pt. is going to [facility] to have a doctor look at it.  Is Patient Diabetic? Yes Pain Assessment Patient in pain? yes     Location: lower back Intensity: 5 Type: aching/jabbing Onset of pain  Constant CBG Result 263 CBG Device ID B non fasting  Does patient need assistance? Functional Status Self care Ambulation Normal LMP (date): 11/23/2010     Menstrual Status regular Enter LMP: 11/23/2010   CC:  F/u from [facility] ER for abcess x2 months. There is occasional drainage. Pt. is going to [facility] to have a doctor look at it. Marland Kitchen  History of Present Illness: 1.  Abdominal wound abscesses:  Has not been seen anywhere since ED visit on 12.20.11.  Did not get set up with home health care.  Has not been to [facility]--has an appt. next week.  She has been performing  own dressing changes.  Has had much less drainage recently.  Finished Cipro 10 day course on 11/12/10.  Took the entire course.  Anterior abdomnal abscesses were evacuated in the ED on 12.20 above.  No fevers recently.  Still with some pain.  Swelling of abdomen is much better.  Grew E. coli from both urine and abdominal wound--both sensitive to Cipro  2.  DM:  Sugars running in 200-300 range on 55 units of Lantus at bedtime.  Has actually been in 400s last 2 days.  Creatinine on 12/20 was 1.3.  Has started working on diet since new year.  Feels she knows what she needs to do.  Having some vaginal itching subsequent to abx, but no discharge.  3.  Lower back pain:  did  not go away with Cipro, though did improve mildly.  Was felt to have a UTI then.  Denies dysuria.  Is urinating frequently, but drinking a lot of water.  No hematuria.  Current Medications (verified): 1)  Famotidine 20 Mg Tabs (Famotidine) .Marland Kitchen.. 1 By Mouth Two Times A Day 2)  Lantus 100 Unit/ml Soln (Insulin Glargine) .Marland Kitchen.. 18 Units Injection At Bedtime 3)  Lisinopril 2.5 Mg Tabs (Lisinopril) .Marland Kitchen.. 1 By Mouth Once Daily 4)  Ferrous Sulfate 325 (65 Fe) Mg Tabs (Ferrous Sulfate) .Marland Kitchen.. 1 Tab By Mouth Two Times A Day 5)  Colace 100 Mg Caps (Docusate Sodium) .Marland Kitchen.. 1 Tab By Mouth Daily 6)  Oxycodone-Acetaminophen 5-325 Mg Tabs (Oxycodone-Acetaminophen) .Marland Kitchen.. 1-2 Tabs By Mouth Every 6 Hours As Needed For Chest Wall Pain 7)  Novolog 100 Unit/ml Soln (Insulin Aspart) .... <120-None; 121-150: 1 U, 151-200: 2 U, 201-250: 3 U; 251-300:  5 U; 301-350: 7 U; 351-400:  9 U 8)  Glimepiride 1 Mg Tabs (Glimepiride) .Marland Kitchen.. 1 Tab By Mouth Daily 9)  Trazodone Hcl 50 Mg Tabs (Trazodone Hcl) .... 2 Tabs By Mouth At Bedtime 10)  Promethazine Hcl 25 Mg Tabs (Promethazine Hcl) .Marland Kitchen.. 1 Tab By  Mouth Daily As Needed Nausea 11)  Metformin Hcl 500 Mg Tabs (Metformin Hcl) .Marland Kitchen.. 1 Tab By Mouth Two Times A Day With Meals 12)  Glucagen Hypokit 1 Mg Solr (Glucagon Hcl (Rdna)) .... Use As Directed For Low Blood Sugar and Unable To Eat or Drink  Allergies (verified): No Known Drug Allergies  Physical Exam  General:  Tearful at times. Lungs:  Normal respiratory effort, chest expands symmetrically. Lungs are clear to auscultation, no crackles or wheezes. Heart:  Normal rate and regular rhythm. S1 and S2 normal without gallop, murmur, click, rub or other extra sounds. Abdomen:  Bowel sounds positive,abdomen soft and non-tender without masses, organomegaly or hernias noted.  2 mm opening in infrumbilical area with little drainage.  NT over entire abdomen Mild left flank tenderness.   Impression & Recommendations:  Problem # 1:  DIABETES  MELLITUS, TYPE II, UNCONTROLLED (ICD-250.02) Stop Glimeperide. Start Glucotrol Pt. only taking Metformin once daily--to take two times a day  Would like to see if can get her on all oral meds as feel she would be more compliant--makes own insulin when checked previously. Long discussion regarding reasons for noncompliance today--pt. feels she is finally ready to address her health issues Her updated medication list for this problem includes:    Lantus 100 Unit/ml Soln (Insulin glargine) .Marland KitchenMarland KitchenMarland KitchenMarland Kitchen 55 units subcutaneously at bedtime    Lisinopril 2.5 Mg Tabs (Lisinopril) .Marland Kitchen... 1 by mouth once daily    Novolog 100 Unit/ml Soln (Insulin aspart) .Marland Kitchen... <120-none; 121-150: 1 u, 151-200: 2 u, 201-250: 3 u; 251-300:  5 u; 301-350: 7 u; 351-400:  9 u    Glucotrol Xl 10 Mg Xr24h-tab (Glipizide) .Marland Kitchen... 1 tab by mouth daily with breakfast.    Metformin Hcl 500 Mg Tabs (Metformin hcl) .Marland Kitchen... 1 tab by mouth two times a day with meals    Glucagen Hypokit 1 Mg Solr (Glucagon hcl (rdna)) ..... Use as directed for low blood sugar and unable to eat or drink  Orders: Hemoglobin A1C (83036)  Problem # 2:  OTHER POSTOPERATIVE INFECTION NEC--WOUND ABSCESS (ICD-998.59) Discussed need to control sugars to bring her infections to a close. As per [facility] gyn onc  Problem # 3:  UTI (ICD-599.0) UA looks better, but still with LE and with left flank tenderness.--check culture  Problem # 4:  Preventive Health Care (ICD-V70.0) Flu and Tdap today  Problem # 5:  ESSENTIAL HYPERTENSION (ICD-401.9) Restart Lisinopril Her updated medication list for this problem includes:    Lisinopril 2.5 Mg Tabs (Lisinopril) .Marland Kitchen... 1 by mouth once daily  Complete Medication List: 1)  Lantus 100 Unit/ml Soln (Insulin glargine) .... 55 units subcutaneously at bedtime 2)  Lisinopril 2.5 Mg Tabs (Lisinopril) .Marland Kitchen.. 1 by mouth once daily 3)  Ferrous Sulfate 325 (65 Fe) Mg Tabs (Ferrous sulfate) .Marland Kitchen.. 1 tab by mouth daily 4)  Novolog 100 Unit/ml Soln  (Insulin aspart) .... <120-none; 121-150: 1 u, 151-200: 2 u, 201-250: 3 u; 251-300:  5 u; 301-350: 7 u; 351-400:  9 u 5)  Glucotrol Xl 10 Mg Xr24h-tab (Glipizide) .Marland Kitchen.. 1 tab by mouth daily with breakfast. 6)  Trazodone Hcl 50 Mg Tabs (Trazodone hcl) .... 2 tabs by mouth at bedtime 7)  Metformin Hcl 500 Mg Tabs (Metformin hcl) .Marland Kitchen.. 1 tab by mouth two times a day with meals 8)  Glucagen Hypokit 1 Mg Solr (Glucagon hcl (rdna)) .... Use as directed for low blood sugar and unable to eat or drink  Other Orders: Capillary Blood Glucose/CBG [id number])  Flu Vaccine 7yrs + [id number]) Admin 1st Vaccine [id number]) UA Dipstick w/o Micro (automated)  (81003) T-Culture, Urine [id number]) Tdap => 33yrs IM [id number]) Admin of Any Addtl Vaccine [id number])  Patient Instructions: 1)  Nurse visit in 2 weeks for bp check and BMET--restarting Lisinopril 2)  Bring in sugars with follow up visit. 3)  Keep visit in February Prescriptions: TRAZODONE HCL 50 MG TABS (TRAZODONE HCL) 2 tabs by mouth at bedtime  #60 x 6   Entered and Authorized by:   Mack Hook MD   Signed by:   Mack Hook MD on 12/03/2010   Method used:   Electronically to        [facility]. [phone]* (retail)       [place], [place]  [address]       Ph: [phone]       Fax: [phone]   RxID:   [id number] FERROUS SULFATE 325 (65 FE) MG TABS (FERROUS SULFATE) 1 tab by mouth daily  #30 x 6   Entered and Authorized by:   Mack Hook MD   Signed by:   Mack Hook MD on 12/03/2010   Method used:   Electronically to        [facility]. [phone]* (retail)       [place], [place]  [address]       Ph: [phone]       Fax: [phone]   [id number] LISINOPRIL 2.5 MG TABS (LISINOPRIL) 1 by mouth once daily  #30 x 6   Entered and Authorized by:   Mack Hook MD   Signed by:   Mack Hook MD on 12/03/2010   Method used:    Electronically to        [facility]. [phone]* (retail)       [place], [place]  [address]       Ph: [phone]       Fax: [phone]   RxID:   [id number] LANTUS 100 UNIT/ML SOLN (INSULIN GLARGINE) 55 units subcutaneously at bedtime  #1 month x 6   Entered and Authorized by:   Mack Hook MD   Signed by:   Mack Hook MD on 12/03/2010   Method used:   Electronically to        [facility]. [phone]* (retail)       [place], [place]  [address]       Ph: [phone]       Fax: [phone]   RxID:   [id number] GLUCOTROL XL 10 MG XR24H-TAB (GLIPIZIDE) 1 tab by mouth daily with breakfast.  #30 x 6   Entered and Authorized by:   Mack Hook MD   Signed by:   Mack Hook MD on 12/03/2010   Method used:   Electronically to        [facility]. [phone]* (retail)       [place], [place]  [address]       Ph: [phone]       Fax: [phone]   RxID:   [id number] METFORMIN HCL 500 MG TABS (METFORMIN HCL) 1 tab by mouth two times a day with meals  #60 x 6   Entered and Authorized by:   Benjamine Mola  Jaiyon Wander MD   Signed by:   Mack Hook MD on 12/03/2010   Method used:   Electronically to        [facility]. [phone]* (retail)       [address] [place], [place]  [address]       Ph: [phone]       Fax: [phone]   RxID:   [id number]    Orders Added: 1)  Capillary Blood Glucose/CBG [82948] 2)  Flu Vaccine 51yrs + [90658] 3)  Admin 1st Vaccine [90471] 4)  UA Dipstick w/o Micro (automated)  [81003] 5)  Hemoglobin A1C [83036] 6)  T-Culture, Urine [id number] 7)  Tdap => 32yrs IM [id number] 8)  Admin of Any Addtl Vaccine [90472] 9)  Est. Patient Level IV [id number]   Immunizations Administered:  Influenza Vaccine # 1:    Vaccine Type: Fluvax 3+    Site: left deltoid    Mfr: GlaxoSmithKline    Dose: 0.5  ml    Route: IM    Given by: [place] Shannon    Exp. Date: 05/14/2011    Lot #: [id number]    VIS given: 06/08/10 version given December 03, 2010.  Tetanus Vaccine:    Vaccine Type: Tdap    Site: right deltoid    Mfr: Curran    Dose: 0.5 ml    Route: IM    Given by: [place] Shannon    Exp. Date: 03/23/2013    Lot #: [id number]    VIS given: 10/01/08 version given December 03, 2010.  Flu Vaccine Consent Questions:    Do you have a history of severe allergic reactions to this vaccine? no    Any prior history of allergic reactions to egg and/or gelatin? no    Do you have a sensitivity to the preservative Thimersol? no    Do you have a past history of Guillan-Barre Syndrome? no    Do you currently have an acute febrile illness? no    Have you ever had a severe reaction to latex? no    Vaccine information given and explained to patient? yes    Are you currently pregnant? no   Immunizations Administered:  Influenza Vaccine # 1:    Vaccine Type: Fluvax 3+    Site: left deltoid    Mfr: GlaxoSmithKline    Dose: 0.5 ml    Route: IM    Given by: [place] Shannon    Exp. Date: 05/14/2011    Lot #: [id number]    VIS given: 06/08/10 version given December 03, 2010.  Tetanus Vaccine:    Vaccine Type: Tdap    Site: right deltoid    Mfr: [place]    Dose: 0.5 ml    Route: IM    Given by: [place] Shannon    Exp. Date: 03/23/2013    Lot #: [id number]    VIS given: 10/01/08 version given December 03, 2010.  Laboratory Results   Urine Tests  Date/Time Received: December 03, 2010 11:59 AM   Routine Urinalysis   Color: lt. yellow Appearance: Clear Glucose: negative   (Normal Range: Negative) Bilirubin: negative   (Normal Range: Negative) Ketone: trace (5)   (Normal Range: Negative) Spec. Gravity: 1.020   (Normal Range: 1.003-1.035) Blood: negative   (Normal Range: Negative) pH: 5.0   (Normal Range: 5.0-8.0) Protein: trace   (Normal Range: Negative) Urobilinogen: 0.2   (Normal  Range: 0-1) Nitrite: negative   (Normal Range: Negative) Leukocyte  Esterace: small   (Normal Range: Negative)     Blood Tests   Date/Time Received: December 03, 2010 11:58 AM   HGBA1C: 10.7%   (Normal Range: Non-Diabetic - 3-6%   Control Diabetic - 6-8%) CBG Random:: 263mg /dL

## 2010-12-16 NOTE — Letter (Signed)
Summary: Discharge Summary/[facility]  Discharge Summary/[facility]   Imported By: Roland Earl 12/01/2010 12:31:07  _____________________________________________________________________  External Attachment:    Type:   Image     Comment:   External Document

## 2010-12-16 NOTE — Letter (Signed)
Summary: *HSN Results Follow up  [facility]  [address] [place], [place] [address]   Phone: [phone]  Fax: [phone]      12/06/2010   [address] [place], [place]  [address]   Dear  Ms. Kylin Waggoner,                            ____S.Drinkard,FNP   ____D. Gore,FNP       ____B. McPherson,MD   ____V. Rankins,MD    ___X_E. Fabion Gatson,MD    ____N. Hassell Done, FNP  ____D. Jobe Igo, MD    ____K. Tomma Lightning, MD    ____Other     This letter is to inform you that your recent test(s):  _______Pap Smear    ___X____Lab Test     _______X-ray    __X_____ is within acceptable limits  _______ requires a medication change  _______ requires a follow-up lab visit  _______ requires a follow-up visit with your provider   Comments:  Urine culture did not show a urine infection at this time       _________________________________________________________ If you have any questions, please contact our office                     Sincerely,  Mack Hook MD [facility]

## 2011-01-24 LAB — CBC
HCT: 37.7 % (ref 36.0–46.0)
MCH: 29.8 pg (ref 26.0–34.0)
MCV: 85.9 fL (ref 78.0–100.0)
RDW: 12.8 % (ref 11.5–15.5)
WBC: 7.5 10*3/uL (ref 4.0–10.5)

## 2011-01-24 LAB — URINE CULTURE: Culture  Setup Time: 201112201934

## 2011-01-24 LAB — URINALYSIS, ROUTINE W REFLEX MICROSCOPIC
Nitrite: POSITIVE — AB
Protein, ur: NEGATIVE mg/dL
Specific Gravity, Urine: 1.017 (ref 1.005–1.030)
Urobilinogen, UA: 0.2 mg/dL (ref 0.0–1.0)

## 2011-01-24 LAB — DIFFERENTIAL
Basophils Absolute: 0 10*3/uL (ref 0.0–0.1)
Eosinophils Relative: 1 % (ref 0–5)
Lymphocytes Relative: 34 % (ref 12–46)
Lymphs Abs: 2.6 10*3/uL (ref 0.7–4.0)
Monocytes Absolute: 0.5 10*3/uL (ref 0.1–1.0)

## 2011-01-24 LAB — BASIC METABOLIC PANEL
BUN: 8 mg/dL (ref 6–23)
CO2: 21 mEq/L (ref 19–32)
Chloride: 103 mEq/L (ref 96–112)
Potassium: 4.2 mEq/L (ref 3.5–5.1)

## 2011-01-24 LAB — GLUCOSE, CAPILLARY
Glucose-Capillary: 347 mg/dL — ABNORMAL HIGH (ref 70–99)
Glucose-Capillary: 74 mg/dL (ref 70–99)

## 2011-01-24 LAB — WOUND CULTURE

## 2011-01-24 LAB — URINE MICROSCOPIC-ADD ON

## 2011-01-30 LAB — GLUCOSE, CAPILLARY
Glucose-Capillary: 105 mg/dL — ABNORMAL HIGH (ref 70–99)
Glucose-Capillary: 111 mg/dL — ABNORMAL HIGH (ref 70–99)
Glucose-Capillary: 112 mg/dL — ABNORMAL HIGH (ref 70–99)
Glucose-Capillary: 130 mg/dL — ABNORMAL HIGH (ref 70–99)
Glucose-Capillary: 132 mg/dL — ABNORMAL HIGH (ref 70–99)

## 2011-01-30 LAB — COMPREHENSIVE METABOLIC PANEL
Albumin: 4.1 g/dL (ref 3.5–5.2)
Alkaline Phosphatase: 87 U/L (ref 39–117)
BUN: 11 mg/dL (ref 6–23)
Chloride: 102 mEq/L (ref 96–112)
Glucose, Bld: 206 mg/dL — ABNORMAL HIGH (ref 70–99)
Potassium: 5.5 mEq/L — ABNORMAL HIGH (ref 3.5–5.1)
Total Bilirubin: 1.3 mg/dL — ABNORMAL HIGH (ref 0.3–1.2)

## 2011-01-30 LAB — URINALYSIS, ROUTINE W REFLEX MICROSCOPIC
Nitrite: NEGATIVE
Specific Gravity, Urine: 1.013 (ref 1.005–1.030)
Urobilinogen, UA: 1 mg/dL (ref 0.0–1.0)

## 2011-01-30 LAB — DIFFERENTIAL
Eosinophils Relative: 1 % (ref 0–5)
Lymphs Abs: 2 10*3/uL (ref 0.7–4.0)
Monocytes Absolute: 0.5 10*3/uL (ref 0.1–1.0)
Neutro Abs: 6.3 10*3/uL (ref 1.7–7.7)

## 2011-01-30 LAB — POTASSIUM: Potassium: 4 mEq/L (ref 3.5–5.1)

## 2011-01-30 LAB — CULTURE, ROUTINE-ABSCESS

## 2011-01-30 LAB — CBC
HCT: 39.5 % (ref 36.0–46.0)
Hemoglobin: 13.3 g/dL (ref 12.0–15.0)
Platelets: 321 10*3/uL (ref 150–400)
WBC: 8.9 10*3/uL (ref 4.0–10.5)

## 2011-01-30 LAB — URINE MICROSCOPIC-ADD ON

## 2011-01-30 LAB — URINE CULTURE

## 2011-02-02 LAB — CARDIAC PANEL(CRET KIN+CKTOT+MB+TROPI)
CK, MB: 0.7 ng/mL (ref 0.3–4.0)
Relative Index: INVALID (ref 0.0–2.5)
Total CK: 7 U/L (ref 7–177)
Troponin I: 0.04 ng/mL (ref 0.00–0.06)
Troponin I: 0.2 ng/mL — ABNORMAL HIGH (ref 0.00–0.06)

## 2011-02-02 LAB — COMPREHENSIVE METABOLIC PANEL
ALT: 14 U/L (ref 0–35)
Albumin: 1.8 g/dL — ABNORMAL LOW (ref 3.5–5.2)
Alkaline Phosphatase: 107 U/L (ref 39–117)
BUN: 12 mg/dL (ref 6–23)
BUN: 19 mg/dL (ref 6–23)
BUN: 22 mg/dL (ref 6–23)
BUN: 5 mg/dL — ABNORMAL LOW (ref 6–23)
CO2: 23 mEq/L (ref 19–32)
Calcium: 8.4 mg/dL (ref 8.4–10.5)
Calcium: 8.4 mg/dL (ref 8.4–10.5)
Calcium: 8.4 mg/dL (ref 8.4–10.5)
Chloride: 105 mEq/L (ref 96–112)
Chloride: 107 mEq/L (ref 96–112)
Creatinine, Ser: 1.35 mg/dL — ABNORMAL HIGH (ref 0.4–1.2)
Creatinine, Ser: 1.78 mg/dL — ABNORMAL HIGH (ref 0.4–1.2)
Creatinine, Ser: 2.55 mg/dL — ABNORMAL HIGH (ref 0.4–1.2)
GFR calc non Af Amer: 34 mL/min — ABNORMAL LOW (ref >60)
GFR calc non Af Amer: 47 mL/min — ABNORMAL LOW (ref >60)
Glucose, Bld: 214 mg/dL — ABNORMAL HIGH (ref 70–99)
Glucose, Bld: 284 mg/dL — ABNORMAL HIGH (ref 70–99)
Glucose, Bld: 387 mg/dL — ABNORMAL HIGH (ref 70–99)
Glucose, Bld: 555 mg/dL (ref 70–99)
Sodium: 134 mEq/L — ABNORMAL LOW (ref 135–145)
Total Bilirubin: 0.9 mg/dL (ref 0.3–1.2)
Total Bilirubin: 1.5 mg/dL — ABNORMAL HIGH (ref 0.3–1.2)
Total Protein: 7.1 g/dL (ref 6.0–8.3)
Total Protein: 7.4 g/dL (ref 6.0–8.3)

## 2011-02-02 LAB — GLUCOSE, CAPILLARY
Glucose-Capillary: 120 mg/dL — ABNORMAL HIGH (ref 70–99)
Glucose-Capillary: 139 mg/dL — ABNORMAL HIGH (ref 70–99)
Glucose-Capillary: 144 mg/dL — ABNORMAL HIGH (ref 70–99)
Glucose-Capillary: 152 mg/dL — ABNORMAL HIGH (ref 70–99)
Glucose-Capillary: 154 mg/dL — ABNORMAL HIGH (ref 70–99)
Glucose-Capillary: 167 mg/dL — ABNORMAL HIGH (ref 70–99)
Glucose-Capillary: 192 mg/dL — ABNORMAL HIGH (ref 70–99)
Glucose-Capillary: 235 mg/dL — ABNORMAL HIGH (ref 70–99)
Glucose-Capillary: 242 mg/dL — ABNORMAL HIGH (ref 70–99)
Glucose-Capillary: 248 mg/dL — ABNORMAL HIGH (ref 70–99)
Glucose-Capillary: 279 mg/dL — ABNORMAL HIGH (ref 70–99)
Glucose-Capillary: 360 mg/dL — ABNORMAL HIGH (ref 70–99)
Glucose-Capillary: 373 mg/dL — ABNORMAL HIGH (ref 70–99)
Glucose-Capillary: 375 mg/dL — ABNORMAL HIGH (ref 70–99)
Glucose-Capillary: 375 mg/dL — ABNORMAL HIGH (ref 70–99)
Glucose-Capillary: 435 mg/dL — ABNORMAL HIGH (ref 70–99)
Glucose-Capillary: 473 mg/dL — ABNORMAL HIGH (ref 70–99)
Glucose-Capillary: 587 mg/dL (ref 70–99)
Glucose-Capillary: 99 mg/dL (ref 70–99)

## 2011-02-02 LAB — CBC
HCT: 20.6 % — ABNORMAL LOW (ref 36.0–46.0)
HCT: 22.5 % — ABNORMAL LOW (ref 36.0–46.0)
HCT: 24.2 % — ABNORMAL LOW (ref 36.0–46.0)
HCT: 26.7 % — ABNORMAL LOW (ref 36.0–46.0)
Hemoglobin: 7.1 g/dL — ABNORMAL LOW (ref 12.0–15.0)
Hemoglobin: 7.7 g/dL — ABNORMAL LOW (ref 12.0–15.0)
Hemoglobin: 9.1 g/dL — ABNORMAL LOW (ref 12.0–15.0)
MCHC: 33.2 g/dL (ref 30.0–36.0)
MCHC: 33.6 g/dL (ref 30.0–36.0)
MCHC: 34.7 g/dL (ref 30.0–36.0)
MCV: 87.1 fL (ref 78.0–100.0)
MCV: 87.6 fL (ref 78.0–100.0)
MCV: 88.3 fL (ref 78.0–100.0)
MCV: 89.3 fL (ref 78.0–100.0)
MCV: 89.7 fL (ref 78.0–100.0)
Platelets: 281 10*3/uL (ref 150–400)
Platelets: 310 10*3/uL (ref 150–400)
Platelets: 393 10*3/uL (ref 150–400)
Platelets: 551 10*3/uL — ABNORMAL HIGH (ref 150–400)
RBC: 2.36 MIL/uL — ABNORMAL LOW (ref 3.87–5.11)
RBC: 2.63 MIL/uL — ABNORMAL LOW (ref 3.87–5.11)
RBC: 2.76 MIL/uL — ABNORMAL LOW (ref 3.87–5.11)
RDW: 13.8 % (ref 11.5–15.5)
RDW: 14.8 % (ref 11.5–15.5)
WBC: 11.4 10*3/uL — ABNORMAL HIGH (ref 4.0–10.5)
WBC: 15.3 10*3/uL — ABNORMAL HIGH (ref 4.0–10.5)
WBC: 16.6 10*3/uL — ABNORMAL HIGH (ref 4.0–10.5)
WBC: 9.9 10*3/uL (ref 4.0–10.5)
WBC: 9.9 10*3/uL (ref 4.0–10.5)

## 2011-02-02 LAB — FERRITIN: Ferritin: 776 ng/mL — ABNORMAL HIGH (ref 10–291)

## 2011-02-02 LAB — PROTIME-INR: INR: 1.48 (ref 0.00–1.49)

## 2011-02-02 LAB — DIC (DISSEMINATED INTRAVASCULAR COAGULATION)PANEL
D-Dimer, Quant: 2.87 ug/mL-FEU — ABNORMAL HIGH (ref 0.00–0.48)
INR: 1.15 (ref 0.00–1.49)
Smear Review: NONE SEEN
aPTT: 25 seconds (ref 24–37)

## 2011-02-02 LAB — APTT: aPTT: 45 seconds — ABNORMAL HIGH (ref 24–37)

## 2011-02-02 LAB — URINE CULTURE: Colony Count: >=100000

## 2011-02-02 LAB — RETICULOCYTES: RBC.: 2.84 MIL/uL — ABNORMAL LOW (ref 3.87–5.11)

## 2011-02-02 LAB — DIFFERENTIAL
Basophils Absolute: 0 10*3/uL (ref 0.0–0.1)
Basophils Relative: 0 % (ref 0–1)
Eosinophils Absolute: 0 10*3/uL (ref 0.0–0.7)
Eosinophils Absolute: 0.1 10*3/uL (ref 0.0–0.7)
Eosinophils Relative: 0 % (ref 0–5)
Eosinophils Relative: 1 % (ref 0–5)
Eosinophils Relative: 1 % (ref 0–5)
Lymphocytes Relative: 18 % (ref 12–46)
Lymphocytes Relative: 9 % — ABNORMAL LOW (ref 12–46)
Lymphs Abs: 1.1 10*3/uL (ref 0.7–4.0)
Lymphs Abs: 1.7 10*3/uL (ref 0.7–4.0)
Monocytes Absolute: 1 10*3/uL (ref 0.1–1.0)
Monocytes Relative: 6 % (ref 3–12)
Monocytes Relative: 9 % (ref 3–12)
Neutro Abs: 10.6 10*3/uL — ABNORMAL HIGH (ref 1.7–7.7)
Neutrophils Relative %: 73 % (ref 43–77)
Neutrophils Relative %: 74 % (ref 43–77)
Neutrophils Relative %: 85 % — ABNORMAL HIGH (ref 43–77)
Neutrophils Relative %: 91 % — ABNORMAL HIGH (ref 43–77)
WBC Morphology: INCREASED

## 2011-02-02 LAB — BASIC METABOLIC PANEL
BUN: 16 mg/dL (ref 6–23)
BUN: 2 mg/dL — ABNORMAL LOW (ref 6–23)
Calcium: 8 mg/dL — ABNORMAL LOW (ref 8.4–10.5)
Calcium: 8.4 mg/dL (ref 8.4–10.5)
Calcium: 9 mg/dL (ref 8.4–10.5)
Chloride: 90 mEq/L — ABNORMAL LOW (ref 96–112)
Creatinine, Ser: 1.15 mg/dL (ref 0.4–1.2)
Creatinine, Ser: 2.18 mg/dL — ABNORMAL HIGH (ref 0.4–1.2)
Creatinine, Ser: 2.53 mg/dL — ABNORMAL HIGH (ref 0.4–1.2)
GFR calc Af Amer: 27 mL/min — ABNORMAL LOW (ref >60)
GFR calc non Af Amer: 56 mL/min — ABNORMAL LOW (ref >60)
Glucose, Bld: 135 mg/dL — ABNORMAL HIGH (ref 70–99)
Potassium: 3.3 mEq/L — ABNORMAL LOW (ref 3.5–5.1)

## 2011-02-02 LAB — CK TOTAL AND CKMB (NOT AT ARMC): Relative Index: INVALID (ref 0.0–2.5)

## 2011-02-02 LAB — WOUND CULTURE

## 2011-02-02 LAB — IRON AND TIBC
Iron: <10 ug/dL — ABNORMAL LOW (ref 42–135)
UIBC: 149 ug/dL

## 2011-02-02 LAB — URINALYSIS, ROUTINE W REFLEX MICROSCOPIC
Ketones, ur: 15 mg/dL — AB
Nitrite: NEGATIVE
Protein, ur: 100 mg/dL — AB
pH: 5 (ref 5.0–8.0)

## 2011-02-02 LAB — LACTIC ACID, PLASMA: Lactic Acid, Venous: 1.5 mmol/L (ref 0.5–2.2)

## 2011-02-02 LAB — CULTURE, BLOOD (ROUTINE X 2)

## 2011-02-02 LAB — MAGNESIUM: Magnesium: 1.8 mg/dL (ref 1.5–2.5)

## 2011-02-02 LAB — CORTISOL: Cortisol, Plasma: 26.8 ug/dL

## 2011-02-02 LAB — CREATININE, URINE, RANDOM: Creatinine, Urine: 98.8 mg/dL

## 2011-02-02 LAB — PHOSPHORUS: Phosphorus: 1.2 mg/dL — ABNORMAL LOW (ref 2.3–4.6)

## 2011-02-02 LAB — TSH: TSH: 0.637 u[IU]/mL (ref 0.350–4.500)

## 2011-02-02 LAB — HEMOCCULT GUIAC POC 1CARD (OFFICE): Fecal Occult Bld: NEGATIVE

## 2011-02-02 LAB — TROPONIN I: Troponin I: 0.09 ng/mL — ABNORMAL HIGH (ref 0.00–0.06)

## 2011-02-20 LAB — COMPREHENSIVE METABOLIC PANEL
BUN: 4 mg/dL — ABNORMAL LOW (ref 6–23)
CO2: 23 mEq/L (ref 19–32)
Chloride: 99 mEq/L (ref 96–112)
Creatinine, Ser: 0.89 mg/dL (ref 0.4–1.2)
GFR calc non Af Amer: >60 mL/min (ref >60)
Total Bilirubin: 0.6 mg/dL (ref 0.3–1.2)

## 2011-02-20 LAB — CBC
HCT: 16.3 % — ABNORMAL LOW (ref 36.0–46.0)
MCV: 81.8 fL (ref 78.0–100.0)
Platelets: 846 10*3/uL — ABNORMAL HIGH (ref 150–400)
RBC: 1.99 MIL/uL — ABNORMAL LOW (ref 3.87–5.11)
WBC: 9.3 10*3/uL (ref 4.0–10.5)

## 2011-02-20 LAB — GLUCOSE, CAPILLARY: Glucose-Capillary: 123 mg/dL — ABNORMAL HIGH (ref 70–99)

## 2011-02-20 LAB — URINE CULTURE: Colony Count: NO GROWTH

## 2011-02-20 LAB — DIFFERENTIAL
Basophils Absolute: 0 10*3/uL (ref 0.0–0.1)
Lymphocytes Relative: 23 % (ref 12–46)
Neutro Abs: 6.7 10*3/uL (ref 1.7–7.7)
Neutrophils Relative %: 72 % (ref 43–77)

## 2011-02-20 LAB — URINALYSIS, ROUTINE W REFLEX MICROSCOPIC
Ketones, ur: 40 mg/dL — AB
Nitrite: NEGATIVE
Specific Gravity, Urine: 1.016 (ref 1.005–1.030)
pH: 6 (ref 5.0–8.0)

## 2011-02-21 LAB — BASIC METABOLIC PANEL
CO2: 28 mEq/L (ref 19–32)
Calcium: 8.9 mg/dL (ref 8.4–10.5)
Glucose, Bld: 150 mg/dL — ABNORMAL HIGH (ref 70–99)
Sodium: 139 mEq/L (ref 135–145)

## 2011-02-21 LAB — CULTURE, BLOOD (ROUTINE X 2): Culture: NO GROWTH

## 2011-02-21 LAB — DIFFERENTIAL
Basophils Absolute: 0 10*3/uL (ref 0.0–0.1)
Lymphocytes Relative: 10 % — ABNORMAL LOW (ref 12–46)
Lymphs Abs: 1.3 10*3/uL (ref 0.7–4.0)
Monocytes Absolute: 1.5 10*3/uL — ABNORMAL HIGH (ref 0.1–1.0)
Monocytes Relative: 11 % (ref 3–12)
Neutro Abs: 10.9 10*3/uL — ABNORMAL HIGH (ref 1.7–7.7)

## 2011-02-21 LAB — CBC
HCT: 23.6 % — ABNORMAL LOW (ref 36.0–46.0)
Hemoglobin: 7.8 g/dL — CL (ref 12.0–15.0)
MCHC: 33 g/dL (ref 30.0–36.0)
MCV: 82.3 fL (ref 78.0–100.0)
RDW: 17.7 % — ABNORMAL HIGH (ref 11.5–15.5)

## 2011-02-21 LAB — GLUCOSE, CAPILLARY

## 2011-02-22 LAB — HEMOGLOBINOPATHY EVALUATION
Hemoglobin Other: 0 % (ref 0.0–0.0)
Hgb A2 Quant: 2.8 % (ref 2.2–3.2)
Hgb A: 56.6 % — ABNORMAL LOW (ref 96.8–97.8)
Hgb S Quant: 40.6 % — ABNORMAL HIGH (ref 0.0–0.0)

## 2011-02-22 LAB — GLUCOSE, CAPILLARY
Glucose-Capillary: 181 mg/dL — ABNORMAL HIGH (ref 70–99)
Glucose-Capillary: 192 mg/dL — ABNORMAL HIGH (ref 70–99)
Glucose-Capillary: 232 mg/dL — ABNORMAL HIGH (ref 70–99)
Glucose-Capillary: 234 mg/dL — ABNORMAL HIGH (ref 70–99)

## 2011-02-22 LAB — CBC
Hemoglobin: 7.2 g/dL — CL (ref 12.0–15.0)
Hemoglobin: 7.2 g/dL — CL (ref 12.0–15.0)
MCHC: 33.6 g/dL (ref 30.0–36.0)
MCHC: 33.9 g/dL (ref 30.0–36.0)
RBC: 2.43 MIL/uL — ABNORMAL LOW (ref 3.87–5.11)
RBC: 2.46 MIL/uL — ABNORMAL LOW (ref 3.87–5.11)
RBC: 2.94 MIL/uL — ABNORMAL LOW (ref 3.87–5.11)
RDW: 16.6 % — ABNORMAL HIGH (ref 11.5–15.5)
WBC: 11.2 10*3/uL — ABNORMAL HIGH (ref 4.0–10.5)

## 2011-02-22 LAB — CULTURE, BLOOD (ROUTINE X 2)

## 2011-02-22 LAB — BASIC METABOLIC PANEL
CO2: 24 mEq/L (ref 19–32)
CO2: 25 mEq/L (ref 19–32)
Calcium: 8.3 mg/dL — ABNORMAL LOW (ref 8.4–10.5)
Calcium: 8.6 mg/dL (ref 8.4–10.5)
Calcium: 8.9 mg/dL (ref 8.4–10.5)
Creatinine, Ser: 0.89 mg/dL (ref 0.4–1.2)
GFR calc Af Amer: >60 mL/min (ref >60)
GFR calc Af Amer: >60 mL/min (ref >60)
GFR calc Af Amer: >60 mL/min (ref >60)
GFR calc non Af Amer: >60 mL/min (ref >60)
Sodium: 138 mEq/L (ref 135–145)
Sodium: 138 mEq/L (ref 135–145)

## 2011-02-22 LAB — RETICULOCYTES
RBC.: 2.48 MIL/uL — ABNORMAL LOW (ref 3.87–5.11)
Retic Count, Absolute: 64.5 10*3/uL (ref 19.0–186.0)
Retic Ct Pct: 2.6 % (ref 0.4–3.1)

## 2011-02-22 LAB — DIFFERENTIAL
Basophils Absolute: 0 10*3/uL (ref 0.0–0.1)
Basophils Relative: 0 % (ref 0–1)
Neutro Abs: 11.3 10*3/uL — ABNORMAL HIGH (ref 1.7–7.7)
Neutrophils Relative %: 82 % — ABNORMAL HIGH (ref 43–77)

## 2011-02-22 LAB — URINALYSIS, ROUTINE W REFLEX MICROSCOPIC
Hgb urine dipstick: NEGATIVE
Protein, ur: NEGATIVE mg/dL
Urobilinogen, UA: 0.2 mg/dL (ref 0.0–1.0)

## 2011-02-22 LAB — APTT: aPTT: 42 seconds — ABNORMAL HIGH (ref 24–37)

## 2011-02-22 LAB — TRANSFERRIN: Transferrin: 96 mg/dL — ABNORMAL LOW (ref 212–360)

## 2011-02-23 LAB — CBC
HCT: 21.4 % — ABNORMAL LOW (ref 36.0–46.0)
HCT: 23.5 % — ABNORMAL LOW (ref 36.0–46.0)
HCT: 24.5 % — ABNORMAL LOW (ref 36.0–46.0)
HCT: 26.6 % — ABNORMAL LOW (ref 36.0–46.0)
HCT: 31.3 % — ABNORMAL LOW (ref 36.0–46.0)
Hemoglobin: 10.7 g/dL — ABNORMAL LOW (ref 12.0–15.0)
Hemoglobin: 7.5 g/dL — CL (ref 12.0–15.0)
Hemoglobin: 8.1 g/dL — ABNORMAL LOW (ref 12.0–15.0)
Hemoglobin: 8.3 g/dL — ABNORMAL LOW (ref 12.0–15.0)
Hemoglobin: 8.6 g/dL — ABNORMAL LOW (ref 12.0–15.0)
Hemoglobin: 9.1 g/dL — ABNORMAL LOW (ref 12.0–15.0)
MCHC: 34 g/dL (ref 30.0–36.0)
MCHC: 34.5 g/dL (ref 30.0–36.0)
MCHC: 34.9 g/dL (ref 30.0–36.0)
MCHC: 35 g/dL (ref 30.0–36.0)
MCHC: 35.2 g/dL (ref 30.0–36.0)
MCV: 83.7 fL (ref 78.0–100.0)
MCV: 84.2 fL (ref 78.0–100.0)
MCV: 84.3 fL (ref 78.0–100.0)
MCV: 84.4 fL (ref 78.0–100.0)
MCV: 84.6 fL (ref 78.0–100.0)
MCV: 85.4 fL (ref 78.0–100.0)
Platelets: 215 10*3/uL (ref 150–400)
Platelets: 249 10*3/uL (ref 150–400)
Platelets: 348 10*3/uL (ref 150–400)
Platelets: 464 10*3/uL — ABNORMAL HIGH (ref 150–400)
RBC: 2.51 MIL/uL — ABNORMAL LOW (ref 3.87–5.11)
RBC: 2.55 MIL/uL — ABNORMAL LOW (ref 3.87–5.11)
RBC: 2.61 MIL/uL — ABNORMAL LOW (ref 3.87–5.11)
RBC: 2.68 MIL/uL — ABNORMAL LOW (ref 3.87–5.11)
RBC: 2.78 MIL/uL — ABNORMAL LOW (ref 3.87–5.11)
RBC: 2.82 MIL/uL — ABNORMAL LOW (ref 3.87–5.11)
RBC: 3.7 MIL/uL — ABNORMAL LOW (ref 3.87–5.11)
RDW: 14.8 % (ref 11.5–15.5)
RDW: 14.9 % (ref 11.5–15.5)
RDW: 15.1 % (ref 11.5–15.5)
RDW: 15.1 % (ref 11.5–15.5)
WBC: 10.4 10*3/uL (ref 4.0–10.5)
WBC: 11 10*3/uL — ABNORMAL HIGH (ref 4.0–10.5)
WBC: 17 10*3/uL — ABNORMAL HIGH (ref 4.0–10.5)
WBC: 22.3 K/uL — ABNORMAL HIGH (ref 4.0–10.5)
WBC: 8.8 10*3/uL (ref 4.0–10.5)
WBC: 9.8 10*3/uL (ref 4.0–10.5)

## 2011-02-23 LAB — BASIC METABOLIC PANEL
BUN: 10 mg/dL (ref 6–23)
BUN: 10 mg/dL (ref 6–23)
BUN: 2 mg/dL — ABNORMAL LOW (ref 6–23)
BUN: 4 mg/dL — ABNORMAL LOW (ref 6–23)
BUN: 8 mg/dL (ref 6–23)
BUN: 8 mg/dL (ref 6–23)
BUN: 8 mg/dL (ref 6–23)
BUN: 9 mg/dL (ref 6–23)
CO2: 18 mEq/L — ABNORMAL LOW (ref 19–32)
CO2: 20 mEq/L (ref 19–32)
CO2: 21 mEq/L (ref 19–32)
CO2: 30 mEq/L (ref 19–32)
Calcium: 8.6 mg/dL (ref 8.4–10.5)
Calcium: 9 mg/dL (ref 8.4–10.5)
Calcium: 9.2 mg/dL (ref 8.4–10.5)
Chloride: 104 mEq/L (ref 96–112)
Chloride: 106 mEq/L (ref 96–112)
Chloride: 108 mEq/L (ref 96–112)
Chloride: 108 mEq/L (ref 96–112)
Chloride: 108 mEq/L (ref 96–112)
Chloride: 109 mEq/L (ref 96–112)
Chloride: 110 mEq/L (ref 96–112)
Chloride: 110 mEq/L (ref 96–112)
Chloride: 110 mEq/L (ref 96–112)
Chloride: 110 mEq/L (ref 96–112)
Creatinine, Ser: 0.84 mg/dL (ref 0.4–1.2)
Creatinine, Ser: 0.93 mg/dL (ref 0.4–1.2)
Creatinine, Ser: 1.16 mg/dL (ref 0.4–1.2)
Creatinine, Ser: 1.23 mg/dL — ABNORMAL HIGH (ref 0.4–1.2)
Creatinine, Ser: 1.35 mg/dL — ABNORMAL HIGH (ref 0.4–1.2)
GFR calc Af Amer: >60 mL/min (ref >60)
GFR calc Af Amer: >60 mL/min (ref >60)
GFR calc Af Amer: >60 mL/min (ref >60)
GFR calc Af Amer: >60 mL/min (ref >60)
GFR calc Af Amer: >60 mL/min (ref >60)
GFR calc Af Amer: >60 mL/min (ref >60)
GFR calc non Af Amer: 46 mL/min — ABNORMAL LOW (ref >60)
GFR calc non Af Amer: 47 mL/min — ABNORMAL LOW (ref >60)
GFR calc non Af Amer: 49 mL/min — ABNORMAL LOW (ref >60)
GFR calc non Af Amer: 56 mL/min — ABNORMAL LOW (ref >60)
GFR calc non Af Amer: >60 mL/min (ref >60)
Glucose, Bld: 104 mg/dL — ABNORMAL HIGH (ref 70–99)
Glucose, Bld: 171 mg/dL — ABNORMAL HIGH (ref 70–99)
Glucose, Bld: 176 mg/dL — ABNORMAL HIGH (ref 70–99)
Glucose, Bld: 222 mg/dL — ABNORMAL HIGH (ref 70–99)
Glucose, Bld: 325 mg/dL — ABNORMAL HIGH (ref 70–99)
Glucose, Bld: 407 mg/dL — ABNORMAL HIGH (ref 70–99)
Potassium: 3 mEq/L — ABNORMAL LOW (ref 3.5–5.1)
Potassium: 3.2 mEq/L — ABNORMAL LOW (ref 3.5–5.1)
Potassium: 3.5 mEq/L (ref 3.5–5.1)
Potassium: 3.7 mEq/L (ref 3.5–5.1)
Potassium: 3.8 mEq/L (ref 3.5–5.1)
Potassium: 3.8 mEq/L (ref 3.5–5.1)
Potassium: 4.1 mEq/L (ref 3.5–5.1)
Sodium: 135 mEq/L (ref 135–145)
Sodium: 135 mEq/L (ref 135–145)
Sodium: 137 mEq/L (ref 135–145)

## 2011-02-23 LAB — GLUCOSE, CAPILLARY
Glucose-Capillary: 100 mg/dL — ABNORMAL HIGH (ref 70–99)
Glucose-Capillary: 108 mg/dL — ABNORMAL HIGH (ref 70–99)
Glucose-Capillary: 111 mg/dL — ABNORMAL HIGH (ref 70–99)
Glucose-Capillary: 112 mg/dL — ABNORMAL HIGH (ref 70–99)
Glucose-Capillary: 114 mg/dL — ABNORMAL HIGH (ref 70–99)
Glucose-Capillary: 115 mg/dL — ABNORMAL HIGH (ref 70–99)
Glucose-Capillary: 120 mg/dL — ABNORMAL HIGH (ref 70–99)
Glucose-Capillary: 124 mg/dL — ABNORMAL HIGH (ref 70–99)
Glucose-Capillary: 130 mg/dL — ABNORMAL HIGH (ref 70–99)
Glucose-Capillary: 140 mg/dL — ABNORMAL HIGH (ref 70–99)
Glucose-Capillary: 140 mg/dL — ABNORMAL HIGH (ref 70–99)
Glucose-Capillary: 147 mg/dL — ABNORMAL HIGH (ref 70–99)
Glucose-Capillary: 147 mg/dL — ABNORMAL HIGH (ref 70–99)
Glucose-Capillary: 152 mg/dL — ABNORMAL HIGH (ref 70–99)
Glucose-Capillary: 152 mg/dL — ABNORMAL HIGH (ref 70–99)
Glucose-Capillary: 155 mg/dL — ABNORMAL HIGH (ref 70–99)
Glucose-Capillary: 156 mg/dL — ABNORMAL HIGH (ref 70–99)
Glucose-Capillary: 159 mg/dL — ABNORMAL HIGH (ref 70–99)
Glucose-Capillary: 159 mg/dL — ABNORMAL HIGH (ref 70–99)
Glucose-Capillary: 160 mg/dL — ABNORMAL HIGH (ref 70–99)
Glucose-Capillary: 162 mg/dL — ABNORMAL HIGH (ref 70–99)
Glucose-Capillary: 163 mg/dL — ABNORMAL HIGH (ref 70–99)
Glucose-Capillary: 163 mg/dL — ABNORMAL HIGH (ref 70–99)
Glucose-Capillary: 163 mg/dL — ABNORMAL HIGH (ref 70–99)
Glucose-Capillary: 168 mg/dL — ABNORMAL HIGH (ref 70–99)
Glucose-Capillary: 168 mg/dL — ABNORMAL HIGH (ref 70–99)
Glucose-Capillary: 178 mg/dL — ABNORMAL HIGH (ref 70–99)
Glucose-Capillary: 179 mg/dL — ABNORMAL HIGH (ref 70–99)
Glucose-Capillary: 181 mg/dL — ABNORMAL HIGH (ref 70–99)
Glucose-Capillary: 200 mg/dL — ABNORMAL HIGH (ref 70–99)
Glucose-Capillary: 200 mg/dL — ABNORMAL HIGH (ref 70–99)
Glucose-Capillary: 204 mg/dL — ABNORMAL HIGH (ref 70–99)
Glucose-Capillary: 210 mg/dL — ABNORMAL HIGH (ref 70–99)
Glucose-Capillary: 211 mg/dL — ABNORMAL HIGH (ref 70–99)
Glucose-Capillary: 252 mg/dL — ABNORMAL HIGH (ref 70–99)
Glucose-Capillary: 260 mg/dL — ABNORMAL HIGH (ref 70–99)
Glucose-Capillary: 290 mg/dL — ABNORMAL HIGH (ref 70–99)
Glucose-Capillary: 294 mg/dL — ABNORMAL HIGH (ref 70–99)
Glucose-Capillary: 297 mg/dL — ABNORMAL HIGH (ref 70–99)
Glucose-Capillary: 298 mg/dL — ABNORMAL HIGH (ref 70–99)
Glucose-Capillary: 310 mg/dL — ABNORMAL HIGH (ref 70–99)
Glucose-Capillary: 316 mg/dL — ABNORMAL HIGH (ref 70–99)
Glucose-Capillary: 345 mg/dL — ABNORMAL HIGH (ref 70–99)
Glucose-Capillary: 345 mg/dL — ABNORMAL HIGH (ref 70–99)
Glucose-Capillary: 347 mg/dL — ABNORMAL HIGH (ref 70–99)
Glucose-Capillary: 351 mg/dL — ABNORMAL HIGH (ref 70–99)
Glucose-Capillary: 355 mg/dL — ABNORMAL HIGH (ref 70–99)
Glucose-Capillary: 357 mg/dL — ABNORMAL HIGH (ref 70–99)
Glucose-Capillary: 361 mg/dL — ABNORMAL HIGH (ref 70–99)
Glucose-Capillary: 369 mg/dL — ABNORMAL HIGH (ref 70–99)
Glucose-Capillary: 389 mg/dL — ABNORMAL HIGH (ref 70–99)
Glucose-Capillary: 395 mg/dL — ABNORMAL HIGH (ref 70–99)
Glucose-Capillary: 503 mg/dL (ref 70–99)
Glucose-Capillary: 515 mg/dL (ref 70–99)
Glucose-Capillary: 54 mg/dL — ABNORMAL LOW (ref 70–99)
Glucose-Capillary: 74 mg/dL (ref 70–99)
Glucose-Capillary: 81 mg/dL (ref 70–99)
Glucose-Capillary: 86 mg/dL (ref 70–99)
Glucose-Capillary: 90 mg/dL (ref 70–99)
Glucose-Capillary: 92 mg/dL (ref 70–99)

## 2011-02-23 LAB — BLOOD GAS, ARTERIAL
Acid-base deficit: 10.4 mmol/L — ABNORMAL HIGH (ref 0.0–2.0)
Drawn by: 308601
FIO2: 0.21 %
O2 Saturation: 95.4 %

## 2011-02-23 LAB — CARDIOLIPIN ANTIBODIES, IGG, IGM, IGA
Anticardiolipin IgG: <7 [GPL'U] — ABNORMAL LOW (ref <11)
Anticardiolipin IgM: <7 [MPL'U] — ABNORMAL LOW (ref <10)

## 2011-02-23 LAB — BASIC METABOLIC PANEL WITH GFR
CO2: 10 meq/L — ABNORMAL LOW (ref 19–32)
GFR calc Af Amer: 57 mL/min — ABNORMAL LOW (ref >60)
Glucose, Bld: 344 mg/dL — ABNORMAL HIGH (ref 70–99)
Potassium: 3.1 meq/L — ABNORMAL LOW (ref 3.5–5.1)
Sodium: 136 meq/L (ref 135–145)

## 2011-02-23 LAB — URINALYSIS, ROUTINE W REFLEX MICROSCOPIC
Bilirubin Urine: NEGATIVE
Glucose, UA: >1000 mg/dL — AB
Ketones, ur: >80 mg/dL — AB
Ketones, ur: NEGATIVE mg/dL
Nitrite: NEGATIVE
Nitrite: NEGATIVE
Protein, ur: 30 mg/dL — AB
Protein, ur: 30 mg/dL — AB
Specific Gravity, Urine: 1.019 (ref 1.005–1.030)
Urobilinogen, UA: 1 mg/dL (ref 0.0–1.0)
pH: 5.5 (ref 5.0–8.0)

## 2011-02-23 LAB — BETA-2-GLYCOPROTEIN I ABS, IGG/M/A: Beta-2 Glyco I IgG: <4 U/mL (ref <20)

## 2011-02-23 LAB — LUPUS ANTICOAGULANT PANEL
DRVVT: 72.5 secs — ABNORMAL HIGH (ref 36.1–47.0)
PTTLA 4:1 Mix: 72.5 secs — ABNORMAL HIGH (ref 36.3–48.8)
dRVVT Incubated 1:1 Mix: 46.9 secs (ref 36.1–47.0)

## 2011-02-23 LAB — BLOOD GAS, VENOUS
Acid-base deficit: 23.8 mmol/L — ABNORMAL HIGH (ref 0.0–2.0)
Bicarbonate: 6.9 meq/L — ABNORMAL LOW (ref 20.0–24.0)
FIO2: 0.21 %
O2 Saturation: 66.1 %
Patient temperature: 98.6
TCO2: 7.4 mmol/L (ref 0–100)
pCO2, Ven: 33.4 mmHg — ABNORMAL LOW (ref 45.0–50.0)
pH, Ven: 6.945 — CL (ref 7.250–7.300)
pO2, Ven: 48.5 mmHg — ABNORMAL HIGH (ref 30.0–45.0)

## 2011-02-23 LAB — COMPREHENSIVE METABOLIC PANEL
Albumin: 1.9 g/dL — ABNORMAL LOW (ref 3.5–5.2)
Alkaline Phosphatase: 131 U/L — ABNORMAL HIGH (ref 39–117)
BUN: 12 mg/dL (ref 6–23)
Creatinine, Ser: 1.68 mg/dL — ABNORMAL HIGH (ref 0.4–1.2)
Glucose, Bld: 546 mg/dL (ref 70–99)
Total Protein: 9 g/dL — ABNORMAL HIGH (ref 6.0–8.3)

## 2011-02-23 LAB — URINE CULTURE
Colony Count: NO GROWTH
Colony Count: NO GROWTH
Culture: NO GROWTH
Culture: NO GROWTH
Special Requests: NEGATIVE
Special Requests: NEGATIVE

## 2011-02-23 LAB — HEMOGLOBIN A1C: Mean Plasma Glucose: 346 mg/dL

## 2011-02-23 LAB — CULTURE, BLOOD (ROUTINE X 2)
Culture: NO GROWTH
Culture: NO GROWTH

## 2011-02-23 LAB — DIFFERENTIAL
Basophils Absolute: 0 10*3/uL (ref 0.0–0.1)
Basophils Relative: 0 % (ref 0–1)
Eosinophils Absolute: 0 K/uL (ref 0.0–0.7)
Eosinophils Relative: 0 % (ref 0–5)
Lymphocytes Relative: 5 % — ABNORMAL LOW (ref 12–46)
Lymphs Abs: 1 K/uL (ref 0.7–4.0)
Monocytes Absolute: 2.6 K/uL — ABNORMAL HIGH (ref 0.1–1.0)
Monocytes Relative: 12 % (ref 3–12)
Neutro Abs: 18.7 10*3/uL — ABNORMAL HIGH (ref 1.7–7.7)
Neutrophils Relative %: 84 % — ABNORMAL HIGH (ref 43–77)

## 2011-02-23 LAB — CULTURE, BLOOD (SINGLE)

## 2011-02-23 LAB — COMPREHENSIVE METABOLIC PANEL WITH GFR
ALT: 17 U/L (ref 0–35)
AST: 18 U/L (ref 0–37)
CO2: 9 meq/L — CL (ref 19–32)
Calcium: 9.4 mg/dL (ref 8.4–10.5)
Chloride: 94 meq/L — ABNORMAL LOW (ref 96–112)
GFR calc Af Amer: 44 mL/min — ABNORMAL LOW (ref >60)
GFR calc non Af Amer: 37 mL/min — ABNORMAL LOW (ref >60)
Potassium: 4.5 meq/L (ref 3.5–5.1)
Sodium: 128 meq/L — ABNORMAL LOW (ref 135–145)
Total Bilirubin: 3.1 mg/dL — ABNORMAL HIGH (ref 0.3–1.2)

## 2011-02-23 LAB — IRON AND TIBC
Saturation Ratios: 14 % — ABNORMAL LOW (ref 20–55)
UIBC: 130 ug/dL

## 2011-02-23 LAB — LIPID PANEL: VLDL: 41 mg/dL — ABNORMAL HIGH (ref 0–40)

## 2011-02-23 LAB — KETONES, QUALITATIVE

## 2011-02-23 LAB — FOLATE: Folate: 7.5 ng/mL

## 2011-02-23 LAB — BRAIN NATRIURETIC PEPTIDE: Pro B Natriuretic peptide (BNP): <30 pg/mL (ref 0.0–100.0)

## 2011-02-23 LAB — PROTEIN S, TOTAL: Protein S Ag, Total: 143 % — ABNORMAL HIGH (ref 70–140)

## 2011-02-23 LAB — PREGNANCY, URINE: Preg Test, Ur: NEGATIVE

## 2011-02-23 LAB — AMYLASE: Amylase: 45 U/L (ref 27–131)

## 2011-02-23 LAB — PROTEIN C, TOTAL: Protein C, Total: 59 % — ABNORMAL LOW (ref 70–140)

## 2011-02-23 LAB — URINE MICROSCOPIC-ADD ON

## 2011-02-23 LAB — APTT: aPTT: 40 seconds — ABNORMAL HIGH (ref 24–37)

## 2011-02-23 LAB — LIPASE, BLOOD: Lipase: 14 U/L (ref 11–59)

## 2011-02-23 LAB — HOMOCYSTEINE: Homocysteine: 3.1 umol/L — ABNORMAL LOW (ref 4.0–15.4)

## 2011-02-23 LAB — PROTIME-INR: Prothrombin Time: 18.9 seconds — ABNORMAL HIGH (ref 11.6–15.2)

## 2011-02-23 LAB — LACTATE DEHYDROGENASE: LDH: 127 U/L (ref 94–250)

## 2011-03-29 NOTE — Discharge Summary (Signed)
Cabrera, [name] NO.:  [id number]   MEDICAL RECORD NO.:  [id number]          PATIENT TYPE:  INP   LOCATION:  [id number]                         FACILITY[facility]  [facility]   PHYSICIAN:  Sherryl Manges, M.D.  DATE OF BIRTH:  [birth year]   DATE OF ADMISSION:  03/01/2009  DATE OF DISCHARGE:  03/10/2009                               DISCHARGE SUMMARY   ADDENDUM:   PRIMARY MEDICAL DOCTOR:  Unassigned.   DISCHARGE DIAGNOSIS:  Refer to interim summary dictated April 25, 201.   Refer to above summary, for details of admission history, procedures,  consultations and detailed clinical course.   As of March 09, 2009, the patient appeared clinically stable, was  asymptomatic apart from mild nausea likely occasioned by antibiotics,  i.e. Doxycycline and Flagyl.  She was ambulating and had shown no  evidence of urinary retention following discontinuation of Foley  catheter.  Physical examination revealed decreased swelling and  tenseness of right lower extremity.  CBGs ranged between 86-92  necessitating scaling down the patient's Lantus insulin.  Hemoglobin was  found more reasonable at 8.3.  We did contact Caswell Corwin, R.N. in  the office of GYN Oncology on March 09, 2009 as originally planned, and  fortunately, after discussion with Dr. Fermin Schwab, she was able to  arrange a follow-up appointment for this patient with Dr. Janie Morning  at [facility] on March 13, 2009 at 1:30 p.m., telephone number [phone].   DISPOSITION:  Provided no acute problems arise overnight, the patient  will be discharged on March 10, 2009.   DIET:  Carbohydrate-modified.   ACTIVITY:  As tolerated.   FOLLOW-UP INSTRUCTIONS:  The patient is to follow up with Dr. Janie Morning, [facility] on March 13, 2009 at 1:30 p.m., telephone number [phone], fax number [phone]-  [phone].  Appointment has already been scheduled and confirmed.  In  addition, arrangements have been made for follow-up at [facility] for  continued management of the patient's medical problems.  Home health RN  has been arranged, as well as outpatient diabetic clinic followup.   DISCHARGE MEDICATIONS:  1. Lantus insulin 10 units subcutaneously q.h.s.  2. Nu-Iron 150 mg p.o. b.i.d.  3. Flagyl 500 mg p.o. b.i.d. to be completed on Mar 18, 2009.  4. Doxycycline 100 mg p.o. b.i.d. to be completed on Mar 18, 2009.  5. Darvocet N-100 one p.o. p.r.n. q. 6 hourly.  A total of 28 pills      have been dispensed.  6. Sliding scale insulin coverage with NovoLog as follows:  CBG 70-120      no insulin, CBG 121-150 one unit, CBG 151-200 two units, CBG 201-      250 three units, CBG 251-300 five units, CBG 301-350 seven units,      CBG 351-400 nine units.      Sherryl Manges, M.D.  Electronically Signed     CO/MEDQ  D:  03/09/2009  T:  03/09/2009  Job:  [id number]

## 2011-03-29 NOTE — Discharge Summary (Signed)
NAMECHARMINE, [name] NO.:  [id number]   MEDICAL RECORD NO.:  [id number]          PATIENT TYPE:  INP   LOCATION:  [id number]                         FACILITY:  [facility]   PHYSICIAN:  Vernell Leep, MD     DATE OF BIRTH:  1981/09/17   DATE OF ADMISSION:  03/16/2009  DATE OF DISCHARGE:  03/18/2009                               DISCHARGE SUMMARY   PRIMARY MEDICAL [name]:  [employer].   DISCHARGE DIAGNOSES:  1. Sepsis.  2. Hospital Acquired Pneumonia.  3. Pelvic mass.  Rule out tubo-ovarian abscess.  4. Hospital-acquired pneumonia.  5. Uncontrolled type 1 diabetes mellitus.  6. Hematology:  Chronic anemia, mild leukocytosis and reactive      thrombocytosis.  7. Recent right lower extremity deep venous thrombosis, status post      inferior vena cava filter.  8. Hydroureter and hydronephrosis, right greater than left.  9. Lupus anticoagulant positive.   DISCHARGE MEDICATIONS:  1. Nu-Iron 150 mg p.o. b.i.d.  2. Lantus increased to 15 units subcutaneously at bedtime.  3. Protonix 40 mg p.o. daily.  4. Albuterol 2.5 mg per 3 mL nebulizing solution 1 dose inhaled q.6 h.  5. Senokot-S 1 tablet p.o. at bedtime.  6. Vancomycin IV per pharmacy.  7. NovoLog sliding scale insulin t.i.d. with meals as below.  For CBG less than 70, implement hypoglycemia protocol; 70-120 mg/dL no  insulin; 121-150 mg/dL 3 units; 151-200 mg/dL 4 units; 201-250 mg/dL 7  units; 251-300 mg/dL 11 units; 301-350 mg/dL 15 units; 351-400 mg/dL 20  units; greater than 400 mg/dL, call MD.  1. NovoLog at bedtime sliding scale insulin.  For CBG between 70-200 mg/dL no insulin; less than 70 mg/dL implement  hypoglycemia protocol; 201-250 mg/dL 2 units; 251-300 mg/dL 3 units; 301-  350 mg/dL 4 units; 351-400 mg/dL 5 units; greater than 400 mg/dL call  MD.  1. Normal saline IV at 100 mL per hour.  2. Zosyn 3.375 grams IV q.8 h.  3. Zofran 4 mg IV q.6 h. p.r.n. for nausea.  4. Albuterol 2.5 mg  per 3 mL nebulizing solution inhaled q.2 h. p.r.n.  5. Robitussin DM 5 mL p.o. q.4 h. p.r.n.  6. Tylenol 650 mg p.o. q.4 h. p.r.n. for mild to moderate pain and      fever.  7. Dilaudid 0.5 to 1 mg IV q.4 h. p.r.n. for severe pain.  8. Dulcolax 10 mg per rectal p.r.n.  9. Fleet enemas p.r.n.   PROCEDURES:  1. Renal ultrasound done today.  Impression:  A.  Mild to moderate right hydronephrosis.  B.  No left hydronephrosis.  C.  No parenchymal masses or visible calculi.  1. Pelvic ultrasound on Mar 17, 2009.  Impression:  Complex cystic mass      in the pelvis.  Top differential consideration is an endometrioma.      Benign or malignant cystic ovarian neoplasm cannot be completely      excluded.  Tubo-ovarian abscess is also a consideration but felt      less likely by the radiologist.  2. CT angiogram of the chest with contrast.  Impression:  A.  Suboptimal contrast bolus without definite pulmonary embolus.  Distal segmental vessels are difficult to evaluate.  B.  Right lower lobe airspace disease concerning for infection.  This is  larger than would be expected for infarction.  C.  Left pleural effusion and associated airspace disease.  The majority  of this likely reflects atelectasis.  D.  More ill-defined airspace disease in the posterior right middle  lobe.   PERTINENT LABORATORIES:  Transferrin is 96, absolute reticulocyte count  64.5.  Blood cultures from Mar 17, 2009, are no growth to date.  Basic  metabolic panel today unremarkable except for glucose of 266.  BUN was  6, creatinine 0.85, potassium 4.  CBC with hemoglobin 7.2, hematocrit  21.1, MCV 86.8, white blood cell 11.2, platelets 605.  PTT 42.  Urine  analysis negative for nitrites, leukocytes, blood, protein.   CONSULTATIONS:  1. Infectious disease, Dr. Bobby Rumpf  2. Phone consultation with GYN, Dr. Vara Guardian COURSE AND PATIENT DISPOSITION:  Please refer to the history  and physical note done on  Mar 17, 2009, for initial admission details.  Please also refer to the discharge summary from March 09, 2009, interim  discharge summary from March 08, 2009, and history and physical from  previous admission to [facility] on March 01, 2009.  In summary, Ms.  Cabrera is a very pleasant 30 year old African American female  patient who was recently admitted to the [facility].  She was  then diagnosed to have new type 1 diabetes mellitus and diabetic  ketoacidosis.  She was found to have multicystic pelvic mass complicated  with bilateral hydroureter and hydronephrosis, pelvic inflammatory  disease, presumed urinary tract infection.  Gynecology was consulted at  that point.  They recommended transfer of the patient to [facility] for possible surgery.  Her case was discussed by the [facility]  Hospitalist MD with GYN oncologist, Dr. Fermin Schwab, who apparently  did not see need for acute inpatient transfer and arranged for an early  outpatient followup for the patient.  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  With these measures,  patient improved and was discharged home on March 09, 2009, on  doxycycline and Flagyl.  She had an appointment to see Dr. Jeannie Done) on March 13, 2009.  However, patient was unable to keep  that appointment and had it rescheduled for Mar 19, 2009.   In the interim, patient started having left-sided chest pain and back  pain and presented to the emergency room.  She also complained of  dyspnea and difficulty taking deep breaths or cough.  She felt weak and  had low-grade temperatures.  Evaluation in the emergency room initially  revealed her to be afebrile but tachycardic at 116 per minute.  She had  hemoglobin of 8.6 and a white cell count of 13.8.   Her CT angiogram of  the chest was suggestive of pneumonia.  It was a suboptimal study to  rule out pulmonary embolism.  She was thereby admitted for further  evaluation and management of possible hospital-acquired pneumonia.  She  was empirically placed on doxycycline, vancomycin and Zosyn.  She was  also placed on Lantus and sliding scale insulin.  She was initially  placed on full dose Lovenox.   1. Sepsis:  The differential diagnosis of this includes possible      hospital-acquired pneumonia given recent hospitalization and      respiratory symptoms and positive CT findings.  The other      differential diagnosis obviously includes her pelvic mass which      could be a potential tubo-ovarian abscess.  Although pyonephrosis      or pyelonephritis are possibilities, her urine analysis is clear,      and her hydronephrosis is actually better than on previous      admission.  Despite being on these antibiotics, patient spiked      temperatures of 102.2 degrees F. overnight.  Infectious disease      consultation was obtained.  Dr. Bobby Rumpf kindly saw the      patient a short while ago.  He has discontinued doxycycline and      recommended CT and surgical evaluation, either by the gynecologist      or general surgeons.  I spoke with Dr. Helane Rima multiple times this      afternoon.  She indicated that we do not have the capabilities to      perform surgery in this young patient with complicated medical      history.  She recommended transfer of this patient to the [facility] Gynecology Service.  Her office has called [facility] and arranged for them to get in touch with me.  I spoke with      Dr. Skeet Latch a short while ago.  Her case was discussed in detail.      She has accepted the patient in transfer on Dr. Andrew Au'      service.  She indicated that patient will probably not have      emergent surgery but might consider needle aspiration of her       possible tubo-ovarian abscess and continued antibiotic therapy.      Once her acute infection and inflammation have settled, then she      may be for potential surgery.  I also advised that      multidisciplinary involvement from infectious disease, urology,      hematology and internal medicine may be warranted for her problems      of hydronephrosis, hydroureter and type 1 diabetes mellitus.  All      of this was discussed in detail with patient, and patient was      advised about her transfer to [facility].  Her questions were      answered, and patient is agreeable to such plan.   Time taken in coordinating this patient's care and discharge was 1 hour  15 minutes.      Vernell Leep, MD  Electronically Signed     AH/MEDQ  D:  03/18/2009  T:  03/18/2009  Job:  [id number]   cc:   Janie Morning, MD   Erik Obey. Helane Rima, M.D.  Fax: [phone]   Doroteo Bradford. Johnnye Sima, M.D.  Fax: [place]  Fax: [phone]   Andrew Au, MD

## 2011-03-29 NOTE — H&P (Signed)
NAMEFIAMMA, DELAFIELD NO.:  [id number]   MEDICAL RECORD NO.:  [id number]          PATIENT TYPE:  EMS   LOCATION:  ED                           FACILITY:  [facility]   PHYSICIAN:  Dena Billet, MD     DATE OF BIRTH:  02-24-1981   DATE OF ADMISSION:  03/16/2009  DATE OF DISCHARGE:                              HISTORY & PHYSICAL   PRIMARY CARE PHYSICIAN:  [facility].   CHIEF COMPLAINT:  Left-sided chest pain and back pain.   HISTORY OF PRESENT ILLNESS:  This is a 30 year old African-American  female patient who was recently diagnosed with diabetes, DVT, status  post IVC filter, and pelvic inflammatory disease was presented to the  [facility] with a chief complaint of pleuritic chest pain.  According to the patient, when she was discharged approximately a week  ago, the patient was still having some shortness of breath, although it  kept on getting worse to the extent that she is not able to cough, not  able to take a deep breath without hurting, and not able to sneeze.  The  patient had been having severe pain and muscle spasms in the left side  of her chest, also left side of her abdomen, and in her back.  The  patient also is feeling very weak and short of breath and had been  having some low-grade temperature at home as well.   REVIEW OF SYSTEMS:  As above.  Rest of the review of systems is  negative.  Please see the recent H and P dictated by [facility]  Hospitalist Team for past medical history, past surgical history, social  history, allergies, medication, and family history.   CURRENT MEDICATIONS:  1. Darvocet 1 tablet p.r.n.  2. Doxycycline 100 mg p.o. b.i.d.  3. Flagyl 500 mg p.o. b.i.d.  4. Iron sulfate 150 mg b.i.d.,  5. Lantus 10 units subcu at q.h.s.  6. NovoLog subcutaneous sliding scale coverage.   PHYSICAL EXAMINATION:  VITAL SIGNS:  Temperature is 98.8, respirations  16 to 18, pulse 68 to 116, blood pressure 111/60 to 136/72, pulse ox  96%  on 2 liters nasal cannula.  GENERAL:  The patient is awake, alert, and oriented x3.  Does appear to  be in some respiratory distress.  HEENT:  Pupils equal, round, and reactive to light.  No icterus.  Mild  pallor.  Extraocular movements are intact.  Oral mucosa is dry.  NECK:  Supple.  No JVD.  No lymphadenopathy.  CVS:  S1 and S2.  Sinus tach.  CHEST:  Bibasilar crackles are present.  ABDOMEN:  Soft and obese.  There is a tenderness on deep palpation on  the lateral aspect of the abdomen.  No rebound.  Bowel sounds present.  No hepatosplenomegaly.  EXTREMITIES:  Peripheral pulses are present.  No clubbing, cyanosis, or  edema.  CNS:  Sensory and motor grossly intact.  Cranial nerves II through XII  are intact.  SKIN:  No rashes.  MUSCULOSKELETAL:  Unremarkable.   LABORATORY DATA:  Basic metabolic panel fairly unremarkable except for  blood sugar of 215.  CBC revealed white count  13.8 with a left shift.  H  and H of 8.6 and 25.6.   The patient's CT angiogram was done, which was suboptimal, although did  reveal airspace disease and pleural effusion in the left lung.   IMPRESSION:  1. Hospital-acquired pneumonia.  2. Possible pulmonary embolus.  The patient is status post inferior      vena cava filter placement.  3. Diabetes, insulin dependent.  4. Anemia.  5. Pleural effusion.  6. Pelvic inflammatory disease.   PLAN:  Admit to telemetry.  Start Zosyn and vancomycin for hospital-  acquired pneumonia.  Start Lovenox at this point 1 mg/kg subcutaneous  b.i.d. for possible PE since the CT angiogram was suboptimal.  Start  Lantus with insulin sliding scale coverage.  Continue doxycycline 100 mg  intravenous b.i.d. for the patient's pelvic inflammatory disease.  Continue nebs and pain control.      Dena Billet, MD  Electronically Signed     NS/MEDQ  D:  03/17/2009  T:  03/17/2009  Job:  [id number]   cc:   Myriam Jacobson  Fax: [phone]

## 2011-03-29 NOTE — Group Therapy Note (Signed)
[name], [name] NO.:  [id number]   MEDICAL RECORD NO.:  [id number]          PATIENT TYPE:  INP   LOCATION:  46                         FACILITY:  [facility]   PHYSICIAN:  Sherryl Manges, M.D.  DATE OF BIRTH:  1981-08-05                                 PROGRESS NOTE   Date of discharge to be determined.   PMD:  Unassigned.   DISCHARGE DIAGNOSES:  1. Diabetes mellitus, newly diagnosed  2. Diabetic ketoacidosis, secondary to diabetes mellitus.  3. Dehydration/acute renal failure, secccondary to # 2.  4. Vulvo-vaginal candidiasis.  5. Presumed urinary tract infection.  6. Pelvic inflammatory disease.  7. Normocytic anemia/iron deficiency.  8. Extensive right lower extremity deep vein thrombosis, status post      inferior vena caval filter, March 05, 2009.  Not considered a      candidate for anticoagulation, secondary to progressive anemia.  9. Low back pain, secondary to degenerative joint disease.  10.Morbid obesity.  11.Fatty liver  12.Multicystic pelvic mass, complicated by bilateral hydronephrosis.   DISCHARGE MEDICATIONS:  These will be listed in addendum at the appropriate time, by discharging  MD.   PROCEDURES:  1. CT abdomen dated March 01, 2009.  This showed a 16x15x25-cm      multicystic pelvic mass causing bilateral renal obstruction.  2. Chest x-ray dated March 02, 2009. This showed no acute      cardiopulmonary abnormalities.  3. Ventilation-perfusion lung scan dated March 02, 2009.  This was low      probability for pulmonary embolus. There was mild obstructive      pulmonary disease.  4. Lower extremity venous Doppler done March 04, 2009.  This showed      findings consistent with extensive acute deep vein thrombosis      involving the right lower extremity.  No evidence of Baker's cyst.  5. Ultrasound-guided IVC filter placed March 05, 2009 by Dr. Markus Daft.  This was an uncomplicated procedure with successful      deployment of  retrieval IVC filter.   CONSULTATIONS:  1. Dr. Dian Cabrera, GYN.  2. Dr. Carylon Perches, interventional radiologist.   ADMISSION HISTORY:  As in H and P notes of March 01, 2009, dictated by Dr. Delice Lesch.  However, in brief, this is a 30 year old female, with known history of  anxiety, bronchial asthma in childhood, morbid obesity, otherwise no  significant past medical history, presenting with a 2-week history of  progressive fatigue, weakness, preceded by upper respiratory tract  symptoms, now associated with abdominal pain, nausea and several  episodes of vomiting. On initial evaluation, she was found to have an  elevated white cell count of 22,000, hemoglobin was 10.7 with hematocrit  of 31.3.  Sodium 128, potassium 3.1, bicarbonate 10, blood glucose 344,  BUN 10, creatinine 1.35. Urine pregnancy test was negative.  Acetone was  positive.  She had a positive urinary sediment with moderate leukocytes  and many bacteria.  These constellation of findings were consistent with  diabetic ketoacidosis complicated by dehydration and acute renal  failure.  She was, therefore, admitted for further evaluation,  investigation and management.   CLINICAL COURSE:  1. Diabetic ketoacidosis.  For details of presentation, refer to      admission history above.  This was a new diagnosis, as the patient      had no previous history of diabetes mellitus.  She was managed with      aggressive intravenous fluid hydration.  Electrolytes were      monitored and abnormalities corrected. Continuous intravenous      infusion of insulin was commenced, per glucostabilizer protocol,      and clinical response was satisfactory. The patient's ketoacidosis      resolved, anion gap closed. We were subsequently able to transition      the patient to scheduled Lantus insulin, as well as sliding scale      insulin coverage and appropriate carbohydrate-modified diet.  As of      March 08, 2009, diabetes  mellitus was controlled with CBG ranging      between 100 and 120.  The patient has undergone diabetic teaching.      At this time it is not clear whether the patient has type 1      diabetes mellitus or type 2.  The fact that she presented in DKA is      suggestive of type 1 diabetes mellitus.  However, her body habitus      suggests type 2. Be that as it may she now appears to be insulin-      requiring. HBA1C was 13.7.   1. Urinary tract infection.  As mentioned above, the patient did have      a positive urinary sediment with moderate leukocytes and many      bacteria. It was felt that this may have been the precipitant for      #1 above.  She was, therefore, commenced on intravenous Rocephin.      Urinalysis did demonstrate yeast, and the patient did have a      whitish vaginal discharge, necessitating treating her with a 5-day      course of Diflucan.  She appeared to respond initially clinically      to the above-mentioned management and measures. However, the      patient continued to spike pyrexia through above-mentioned      antibiotic, particularly on March 04, 2009 and March 05, 2009, when      she had recorded temperatures as high as 101.  On suspicion of      pelvic inflammatory disease, she was on March 05, 2009, switched      over to a combination of Doxycycline and Flagyl, while Rocephin was      discontinued.  The patient has remained afebrile ever since and      improved clinically in well-being. Of note, urine cultures remained      persistently negative.   1. Pelvic inflammatory disease. For details, refer to #2 above. As of      March 08, 2009, the patient was on day #4 of a planned 14-day      course of Flagyl and Doxycycline.   1. Pelvic mass/bilateral hydronephrosis. Because of abdominal pain      noted at the time of presentation, which was likely secondary to      diabetic ketoacidosis, the patient underwent abdominal CT scan      which demonstrated a large  16x15x25 cm multicystic pelvic mass,      causing bilateral renal obstruction. CA-125 tumor marker was done  and was within normal limits at 20.4. GYN consultation was called,      which was kindly provided by Dr. Dian Cabrera, who was initially      felt that the patient may require exploratory laparotomy, possibly      at a tertiary institute, and as a matter of fact, assured [place] that      she will arrange to have the patient transfer to [facility]      for same. It is not clear thereafter what happened, however it      appears that a discussion was had with Dr. Fermin Schwab, GYN      oncologist, who felt that this was not likely to be a malignant      neoplasm and has indicated that the patient could follow up with      him for an elective procedure. My attempts to reach Dr. Helane Rima      proved unsuccessful, and I will reattempt to contact her on March 09, 2009, and also touch base with Dr. Mora Bellman office,      with a view to establishing some kind of consensus for definitive      management and a possible time frame for this.  It appears that the      patient's pelvic mass is causing obstructive features, including      bilateral hydronephrosis. As a matter of fact an attempt to      discontinue patient's Foley`s catheter resulted in acute urinary      retention.   1. Normocytic/iron deficiency anemia.  The patient at the time of      presentation, had a hemoglobin of 10.7, against a background of      dehydration. During the course of the patient's hospitalization and      with euvolemia, there has been a steady downward drift in the      patient's hemoglobin levels, although she had no overt evidence of      bleeding. Stool guaiacs so far have proven negative, and the iron      studies showed the following findings:  Iron 21, TIBC 151,      percentage saturation 14.  Vitamin B12 was elevated at 1483.  Serum      folate level was 7.5.  Ferritin was 1344.   Unfortunately, it did      not prove feasible to transfuse the patient, as she is a [employer]      [employer].  When hemoglobin dropped down to an all time low of 7.5 on      March 05, 2009, we had no choice but to transfuse the patient with      an INFeD infusion, as a compromise measure. This was carried out on      March 06, 2009.  The patient's hemoglobin has remained stable ever      since, although significantly low and as of March 08, 2009      hemoglobin was 7.7.  She will require continued iron      supplementation at the time of discharge.   1. Right lower extremity deep vein thrombosis.  The patient was noted      during this hospitalization, to have right lower extremity edema      and tenseness. Ventilation-perfusion lung scan was negative for      pulmonary embolism. It was reported as low probability.  However,      lower extremity venous Doppler demonstrated an  extensive DVT      affecting the right lower extremity.  Because of the patient's      significant anemia and lack of feasibility of utilizing a blood      transfusion should hemoglobin drop further, it was felt that this      was a relative contraindication to anticoagulation therapy.  With      the patient's agreement, an IVC filter was placed on March 05, 2009      by Dr. Anselm Pancoast, interventional radiologist in an uncomplicated      procedure. A thrombophilic screen was carried out, and this showed      antithrombin III level of 130 which was actually somewhat elevated.      Protein C total was low at 59 against a normal range of 70-140.      Protein C functional was normal at 95 against a normal range of 75-      133. Protein S functional was normal at 110 against a normal level      of 69-129. Lupus anticoagulant was detected   1. Fatty liver.  This is likely secondary to diabetes mellitus and      obesity and was an incidental finding noted on abdominal CT scan      which was done on March 01, 2009. Of note, the  patient total      cholesterol was 131 and the triglyceride was 203.  The patient will      benefit from diet and exercise.   1. Low back pain.  This was the patient's complaint during the course      of her hospitalization.  Initially an MRI was planned, but      following discussion with radiologist reading CT scan findings,      radiologist has confirmed that a good view of the patient's      lumbosacral spine was obtained and this demonstrated DJD with disk      bulge but no other acute findings.  The patient has been reassured      accordingly.  She was managed with analgesics during the course of      this hospitalization.   DISPOSITION:  This will be elucidated in detail at the appropriate time by discharging  MD, and will in large part depend on the gynecology recommendations as  well as plan for definitive management of the patient's pelvic mass.      Sherryl Manges, M.D.  Electronically Signed     CO/MEDQ  D:  03/08/2009  T:  03/08/2009  Job:  [id number]

## 2011-03-29 NOTE — H&P (Signed)
NAMEINDIGO, SIEVERDING NO.:  [id number]   MEDICAL RECORD NO.:  [id number]          PATIENT TYPE:  EMS   LOCATION:  ED                           FACILITY:  [facility]   PHYSICIAN:  Delice Lesch, MD        DATE OF BIRTH:  1981-10-21   DATE OF ADMISSION:  03/01/2009  DATE OF DISCHARGE:                              HISTORY & PHYSICAL   PRIMARY CARE PHYSICIAN:  Unassigned.   CHIEF COMPLAINT:  Vomiting.   HISTORY OF PRESENT ILLNESS:  Ms. [name] is a 30 year old very  pleasant but extremely unfortunate African American female with no past  medical history except for a history of anxiety and asthma as a child  who presents to the ED today with 2 weeks of symptoms of not feeling  well.  The patient stated roughly 2 weeks ago started feeling fatigue  and upper respiratory infection symptoms, including cold, cough,  congestion.  She originally thought it was flu which it transiently got  better, but then it got worse on a daily basis where her appetite was  affected.  She was chronically getting weaker and for the last several  days, was also complaining of abdominal pain with nausea and several  episodes of nonbilious vomiting.  She also complained of some subjective  fevers and chills.  She denies any urinary frequency or urgency.  She  denies any sick contacts.  She denies any camping trips.  She otherwise  was doing well until 2 weeks ago.  She denies any leg swelling,  shortness of breath.  For the last day or 2 has not been able to get out  of the bed because of the condition.   PAST MEDICAL HISTORY:  Remarkable for anxiety and asthma as a child.   MEDICATIONS:  None.   PAST SURGICAL HISTORY:  None.   SOCIAL HISTORY:  The patient is a current Arboriculturist at [employer].  Denies any tobacco, alcohol, or drug abuse.  The patient is  single.  Lives with her mother, brother, and sister.   FAMILY MEDICAL HISTORY:  Significant for diabetes and liver  complications leading to death in her father.  Her mother has diabetes,  end-stage renal disease, and multiple episodes of dialysis with  infections.  Her other siblings are in good health.   REVIEW OF SYSTEMS:  All pertinent positives as in HPI.  Otherwise,  negative complete 12-point review of systems performed.   PHYSICAL EXAMINATION:  CONSTITUTIONAL:  Reveals a 30 year old [place]  American female who is morbidly obese, alert and oriented to time place  and person.  No acute apparent distress.  Looks chronically ill.  VITAL SIGNS:  Temperature on presentation 98.1, blood pressure 115/96,  pulse 120-140 per minute, respirations 20 per minute, and saturations  100% on room air.  HEENT:  Conjunctivae, sclerae clear.  Pupils equal, round, and reactive  to light.  Extraocular movements intact.  No JVD.  No lymphadenopathy.  CARDIOVASCULAR:  Regular rate and rhythm.  Tachycardic.  No murmur, rub,  or gallop.  CHEST:  Clear to auscultation bilaterally.  ABDOMEN:  Nondistended with tenderness  to palpation in the right upper  quadrant with guarding and rigidity.  Bowel sounds are hypoactive.  EXTREMITIES:  Peripheral pulses bilaterally.  NEUROLOGIC:  Strength and sensorium intact.  Speech normal.  PSYCH:  Normal.   LABORATORY AND DIAGNOSTIC DATA:  VBG drawn reveals a pH of 6.905 with a  bicarb of 6.9 and a base deficit 22.8 with a PO2 of 48.5 and a PCO2  33.4.  CBC reveals a white count of 22,000 with a hemoglobin of 10.7,  hematocrit 31.3, platelets 464,000.  BMP shows sodium 128, potassium  3.1, chloride 108, bicarb 10, glucose 344, BUN 10, creatinine 1.35, GFR  47, bilirubin 2.1, alkaline phosphatase 131, AST 18, ALT 17.  Calcium  9.0.  Acetone positive.  Urine pregnancy test negative.  Urinalysis  reveals cloudy urine with 4+ glucose, positive ketones, positive  protein, moderate leukocytes, many bacteria.  A 12-lead EKG reveals  sinus tachycardia at a rate of 144 with no other ST-T  wave changes and  axis was normal.   ASSESSMENT AND PLAN:  91. A 30 year old [place] American female with severe diabetic      ketoacidosis.  2. New-onset insulin dependent diabetes mellitus.  3. Acute renal failure.  4. Vaginal candidiasis.  5. Hypokalemia.  6. Abdominal pain of unclear etiology, probably pancreatitis with      gastritis.  7. Anemia of unclear etiology.  8. Leukocytosis, probably secondary to diabetic ketoacidosis.  9. Urinary tract infection.  10.Elevated bilirubin of unclear etiology.  11.Obesity.   RECOMMENDATIONS:  1. Will admit the patient to ICU and place the patient on DKA protocol      per the hospital.  2. Give the patient IV Diflucan 200 mg 1 dose.  3. IV Rocephin for UTI.  4. P.r.n. medications.  5. Will check hemoglobin A1c fasting lipid profile, alkaline      phosphatase, and anemia panel.  6. Will check a CT abdomen and pelvis for abdominal pain.  7. Will also check urinalysis and urine culture.  8. The patient currently is in serious/critical condition and she is      made aware of that.      Delice Lesch, MD  Electronically Signed     AP/MEDQ  D:  03/01/2009  T:  03/01/2009  Job:  [id number]

## 2011-03-29 NOTE — Consult Note (Signed)
NAMELILY, MACZKA NO.:  [id number]   MEDICAL RECORD NO.:  [id number]          PATIENT TYPE:  EMS   LOCATION:                               FACILITY:  [facility]   PHYSICIAN:  Karlyn Agee, M.D. DATE OF BIRTH:  05-12-1981   DATE OF CONSULTATION:  DATE OF DISCHARGE:                                 CONSULTATION   PRIMARY CARE PHYSICIAN:  Holiday representative.   CHIEF COMPLAINT:  Nausea, vomiting, and abdominal pain since yesterday.   HISTORY OF PRESENT ILLNESS:  This is an obese, young, African American  lady who is also a Jehovah's Witness over the past few months has  developed a rather complicated medical history.  She was newly diagnosed  with type 1 diabetes in April of this year at the time when she  presented with a complicated infected pelvic mass and at that time also  developed deep venous thrombosis.  Because the patient is a Sales promotion account executive  Witness and the risk of bleed, she had an IVC filter placed.  In any  event, the patient eventually had surgery for infected ovarian cyst on  June 14.  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  On June 6, she had  drainage of an abdominal wall abscess, complication of June surgery.  She still has in a pelvic drain status post the June surgery, and she  now has __________ abdominal wall drain status post her recent surgery.  On the evening of June 8, the patient started having colicky abdominal  pain and then vomiting and has also been having diarrhea.  Diarrhea has  now ceased, and since morning of 06-Jun-2023, she has passed no stool and has  passed no gas.  She is unable to keep down anything she eats.  She came  to the emergency room, had a CT scan done which showed partial small-  bowel obstruction with a transition point in the left pelvis.  The  hospitalist service was called for admission for partial small-bowel  obstruction.  Additionally, the  patient was noted to have a hemoglobin  of 5.   The patient denies any blood in the vomitus.  She denies any fever,  cough or cold.  Other than abdominal pain, she denies any other pains.   PAST MEDICAL HISTORY:  1. Diabetes type 1.  2. Hypertension.  3. Anxiety.  4. Hypercoagulable state due to lupus anticoagulant and chronic      Lovenox therapy.  5. Status post recent excision of ovarian cyst with bowel resection.  6. Status post recent drainage of abdominal wall abscess.   MEDICATIONS:  1. Lisinopril 2.5 mg daily.  2. Morphine 20 mg daily.  3. Lovenox 100 mg subcutaneous twice daily.  4. Lantus 80 units subcutaneous at bedtime.  5. Percocet 5/325 every 4-8 hours as needed.  6. Motrin 600 mg as needed.  7. Levaquin 750 mg daily.  8. Diflucan 200 mg daily.  9. Zyvox 600 mg twice daily.  10.NovoLog by sliding scale.   ALLERGIES:  No known drug allergies.   SOCIAL HISTORY:  Denies tobacco, alcohol or illicit drug use.  Denies  sexual activity.  She is a Sales promotion account executive Witness and does not accept blood  products.   FAMILY HISTORY:  Is significant for coronary artery disease and  hypertension.   REVIEW OF SYSTEMS:  Significant for progressive weight loss due to her  chronic ill health.  She has irregular menstruation because of her  ovarian problems.  Her last menstrual period was March 3.  Her periods  are not usually heavy.  Other than this, a 10-point review of systems is  unremarkable.   PHYSICAL EXAMINATION:  Obese, African American, young lady lying in bed,  does not appear acutely distressed at this time.  She has only vomited  once since being in the emergency room.  VITALS:  Her temperature is 98.1, pulse is 103, respirations 18, blood  pressure 103/67.  She is saturating 100% on 2 liters.  Pupils are round and equal.  Mucous membranes pale.  Anicteric.  No cervical lymphadenopathy or thyromegaly.  No jugular venous  distention.  Her chest is clear to auscultation  bilaterally.  CARDIOVASCULAR SYSTEM:  Regular rhythm.  No murmur heard.  Her abdomen is obese and soft.  She had subumbilical midline bandage  status post her surgery.  She has 2 pigtail drains coming out of the  right lower quadrant.  Her extremities are without edema.  She has 1+ dorsalis pedis pulses  bilaterally.  Her skin is warm and dry, and apart from the surgical wounds, no  ulcerations are noted.  CENTRAL NERVOUS SYSTEM:  Cranial nerves II-XII are grossly intact.  She  has no focal neurologic deficit.   LABORATORY DATA:  Her white count is 9.3, hemoglobin 5.5, hematocrit  16.3, platelets 846.  She has 72% neutrophils.  Absolute granulocyte  count is 6.7.  Her sodium is 136, potassium 4.4, chloride 99, CO2 23,  glucose 167, BUN 4, creatinine 0.89.  Her albumin is 2.7, total protein  8.0.  Liver functions are otherwise unremarkable.  Urinalysis shows  small amount of bilirubin, ketones 40, nitrites and leukocyte esterase  negative, glucose negative.  CT scan of her abdomen and pelvis reveals a  minimal residual right hydronephrosis, mild left lower lobe atelectasis,  mild sludge in the gallbladder, partial small-bowel obstruction with  transition point in left lower abdomen laterally, small amount of  adjacent free peritoneal fluid.  There is the suggestion of an  anteflexed uterus with adjacent matted small-bowel loops, a small amount  of fluid and gas in the anterior aspect of the rectus sheath on the  right.   ASSESSMENT:  1. Partial small-bowel obstruction status post recent surgery.  2. Severe anemia in a lady who does not accept blood products.  3. History of intra-abdominal infection on multiple antibiotics.  4. Hypercoagulable state on chronic blood thinners in the setting of      severe anemia.  5. Diabetes type 1 fair control.  6. History of hypertension.   PLAN:  Will call [facility] to request transfer to her primary  surgeons, otherwise will admit this  lady for management of her partial  small-bowel obstruction.  For the time being, will not pass an NG tube  since she has only vomited once within at least the last 12 hours, and  she is fully anticoagulated.  Will manage her with IV fluids and keep  her n.p.o.  Will consult the hematologist service regarding the wisdom  of continuing Lovenox in the presence of a  hemoglobin of 5.5.  Will  check her serum ferritin and will administer Procrit with serum iron as  indicated.  We will continue her antibiotics intravenously and will  consider transferring her to [facility] for continued management.  Will continue her lantus and sliding scale insulin as indicated.    Addendum: Dr. Skeet Latch agreed to accept this patient in trasnfer to  [facility]. Patient referred back to the ED physician for transfer.      Karlyn Agee, M.D.  Electronically Signed     LC/MEDQ  D:  05/23/2009  T:  05/23/2009  Job:  [id number]   cc:   Lacretia Leigh, M.D.  Fax: [phone]   [place]  Fax: [phone]   Janie Morning, MD

## 2012-08-19 ENCOUNTER — Inpatient Hospital Stay (HOSPITAL_COMMUNITY): Payer: Medicaid Other

## 2012-08-19 ENCOUNTER — Emergency Department (HOSPITAL_COMMUNITY): Payer: Medicaid Other

## 2012-08-19 ENCOUNTER — Inpatient Hospital Stay (HOSPITAL_COMMUNITY)
Admission: EM | Admit: 2012-08-19 | Discharge: 2012-09-04 | DRG: 372 | Disposition: A | Discharge status: Home / Self Care | Payer: Medicaid Other | Attending: Surgery | Admitting: Surgery

## 2012-08-19 ENCOUNTER — Encounter (HOSPITAL_COMMUNITY): Payer: Self-pay | Admitting: Emergency Medicine

## 2012-08-19 DIAGNOSIS — I82509 Chronic embolism and thrombosis of unspecified deep veins of unspecified lower extremity: Secondary | ICD-10-CM | POA: Diagnosis present

## 2012-08-19 DIAGNOSIS — B961 Klebsiella pneumoniae [K. pneumoniae] as the cause of diseases classified elsewhere: Secondary | ICD-10-CM | POA: Diagnosis present

## 2012-08-19 DIAGNOSIS — D509 Iron deficiency anemia, unspecified: Secondary | ICD-10-CM

## 2012-08-19 DIAGNOSIS — F329 Major depressive disorder, single episode, unspecified: Secondary | ICD-10-CM | POA: Diagnosis present

## 2012-08-19 DIAGNOSIS — N731 Chronic parametritis and pelvic cellulitis: Secondary | ICD-10-CM | POA: Diagnosis present

## 2012-08-19 DIAGNOSIS — F411 Generalized anxiety disorder: Secondary | ICD-10-CM | POA: Diagnosis present

## 2012-08-19 DIAGNOSIS — F3289 Other specified depressive episodes: Secondary | ICD-10-CM | POA: Diagnosis present

## 2012-08-19 DIAGNOSIS — I129 Hypertensive chronic kidney disease with stage 1 through stage 4 chronic kidney disease, or unspecified chronic kidney disease: Secondary | ICD-10-CM | POA: Diagnosis present

## 2012-08-19 DIAGNOSIS — Z9049 Acquired absence of other specified parts of digestive tract: Secondary | ICD-10-CM

## 2012-08-19 DIAGNOSIS — IMO0001 Reserved for inherently not codable concepts without codable children: Secondary | ICD-10-CM | POA: Diagnosis present

## 2012-08-19 DIAGNOSIS — E119 Type 2 diabetes mellitus without complications: Secondary | ICD-10-CM

## 2012-08-19 DIAGNOSIS — Z6839 Body mass index (BMI) 39.0-39.9, adult: Secondary | ICD-10-CM

## 2012-08-19 DIAGNOSIS — Z7901 Long term (current) use of anticoagulants: Secondary | ICD-10-CM

## 2012-08-19 DIAGNOSIS — K3533 Acute appendicitis with perforation and localized peritonitis, with abscess: Secondary | ICD-10-CM

## 2012-08-19 DIAGNOSIS — I10 Essential (primary) hypertension: Secondary | ICD-10-CM

## 2012-08-19 DIAGNOSIS — N390 Urinary tract infection, site not specified: Secondary | ICD-10-CM | POA: Diagnosis present

## 2012-08-19 DIAGNOSIS — K650 Generalized (acute) peritonitis: Secondary | ICD-10-CM

## 2012-08-19 DIAGNOSIS — K651 Peritoneal abscess: Principal | ICD-10-CM | POA: Diagnosis present

## 2012-08-19 DIAGNOSIS — D649 Anemia, unspecified: Secondary | ICD-10-CM

## 2012-08-19 DIAGNOSIS — A419 Sepsis, unspecified organism: Secondary | ICD-10-CM

## 2012-08-19 DIAGNOSIS — F419 Anxiety disorder, unspecified: Secondary | ICD-10-CM

## 2012-08-19 DIAGNOSIS — E669 Obesity, unspecified: Secondary | ICD-10-CM | POA: Diagnosis present

## 2012-08-19 DIAGNOSIS — N189 Chronic kidney disease, unspecified: Secondary | ICD-10-CM

## 2012-08-19 DIAGNOSIS — Z9119 Patient's noncompliance with other medical treatment and regimen: Secondary | ICD-10-CM

## 2012-08-19 DIAGNOSIS — Z91199 Patient's noncompliance with other medical treatment and regimen due to unspecified reason: Secondary | ICD-10-CM

## 2012-08-19 HISTORY — DX: Essential (primary) hypertension: I10

## 2012-08-19 HISTORY — DX: Type 2 diabetes mellitus with ketoacidosis without coma: E11.10

## 2012-08-19 HISTORY — DX: Mental disorder, not otherwise specified: F99

## 2012-08-19 HISTORY — DX: Pyothorax without fistula: J86.9

## 2012-08-19 HISTORY — DX: Other specified diseases of liver: K76.89

## 2012-08-19 HISTORY — DX: Oophoritis, unspecified: N70.92

## 2012-08-19 HISTORY — DX: Urinary tract infection, site not specified: N39.0

## 2012-08-19 HISTORY — DX: Acute pyelonephritis: N10

## 2012-08-19 HISTORY — DX: Acute embolism and thrombosis of unspecified deep veins of unspecified lower extremity: I82.409

## 2012-08-19 HISTORY — DX: Female pelvic inflammatory disease, unspecified: N73.9

## 2012-08-19 HISTORY — DX: Reserved for concepts with insufficient information to code with codable children: IMO0002

## 2012-08-19 HISTORY — DX: Other specified noninfective gastroenteritis and colitis: K52.89

## 2012-08-19 LAB — CBC WITH DIFFERENTIAL/PLATELET
Basophils Absolute: 0 10*3/uL (ref 0.0–0.1)
Basophils Relative: 0 % (ref 0–1)
MCHC: 34.2 g/dL (ref 30.0–36.0)
Neutro Abs: 16.2 10*3/uL — ABNORMAL HIGH (ref 1.7–7.7)
Neutrophils Relative %: 85 % — ABNORMAL HIGH (ref 43–77)
Platelets: 563 10*3/uL — ABNORMAL HIGH (ref 150–400)
RDW: 12.9 % (ref 11.5–15.5)

## 2012-08-19 LAB — COMPREHENSIVE METABOLIC PANEL
AST: 9 U/L (ref 0–37)
Albumin: 2.8 g/dL — ABNORMAL LOW (ref 3.5–5.2)
Chloride: 91 mEq/L — ABNORMAL LOW (ref 96–112)
Creatinine, Ser: 1.23 mg/dL — ABNORMAL HIGH (ref 0.50–1.10)
Potassium: 4 mEq/L (ref 3.5–5.1)
Total Bilirubin: 0.3 mg/dL (ref 0.3–1.2)

## 2012-08-19 LAB — URINE MICROSCOPIC-ADD ON

## 2012-08-19 LAB — GLUCOSE, CAPILLARY: Glucose-Capillary: 287 mg/dL — ABNORMAL HIGH (ref 70–99)

## 2012-08-19 LAB — URINALYSIS, ROUTINE W REFLEX MICROSCOPIC
Glucose, UA: 250 mg/dL — AB
pH: 5 (ref 5.0–8.0)

## 2012-08-19 LAB — BLOOD GAS, VENOUS
Acid-base deficit: 0 mmol/L (ref 0.0–2.0)
pCO2, Ven: 37.5 mmHg — ABNORMAL LOW (ref 45.0–50.0)
pH, Ven: 7.418 — ABNORMAL HIGH (ref 7.250–7.300)
pO2, Ven: 16.6 mmHg — CL (ref 30.0–45.0)

## 2012-08-19 LAB — PREGNANCY, URINE: Preg Test, Ur: NEGATIVE

## 2012-08-19 LAB — LACTIC ACID, PLASMA: Lactic Acid, Venous: 1.7 mmol/L (ref 0.5–2.2)

## 2012-08-19 MED ORDER — IOHEXOL 300 MG/ML  SOLN
100.0000 mL | Freq: Once | INTRAMUSCULAR | Status: AC | PRN
  Administered 2012-08-19: 100 mL via INTRAVENOUS

## 2012-08-19 MED ORDER — HYDROMORPHONE HCL PF 1 MG/ML IJ SOLN
1.0000 mg | Freq: Once | INTRAMUSCULAR | Status: AC
  Administered 2012-08-19: 1 mg via INTRAVENOUS

## 2012-08-19 MED ORDER — ACETAMINOPHEN 325 MG PO TABS
650.0000 mg | ORAL_TABLET | Freq: Once | ORAL | Status: AC
  Administered 2012-08-19: 650 mg via ORAL
  Filled 2012-08-19: qty 2

## 2012-08-19 MED ORDER — DEXTROSE 5 % IV SOLN
2.0000 g | INTRAVENOUS | Status: AC
  Administered 2012-08-19: 2 g via INTRAVENOUS
  Filled 2012-08-19: qty 2

## 2012-08-19 MED ORDER — SODIUM CHLORIDE 0.9 % IV SOLN
INTRAVENOUS | Status: DC
  Administered 2012-08-19: 2.8 [IU]/h via INTRAVENOUS
  Filled 2012-08-19: qty 1

## 2012-08-19 MED ORDER — INSULIN ASPART 100 UNIT/ML ~~LOC~~ SOLN
0.0000 [IU] | SUBCUTANEOUS | Status: DC
  Administered 2012-08-19: 11 [IU] via SUBCUTANEOUS
  Administered 2012-08-20 (×3): 4 [IU] via SUBCUTANEOUS
  Administered 2012-08-20: 7 [IU] via SUBCUTANEOUS
  Administered 2012-08-21 (×2): 3 [IU] via SUBCUTANEOUS
  Administered 2012-08-21 (×4): 4 [IU] via SUBCUTANEOUS
  Administered 2012-08-22: 3 [IU] via SUBCUTANEOUS
  Administered 2012-08-22: 4 [IU] via SUBCUTANEOUS
  Administered 2012-08-23 – 2012-08-26 (×8): 3 [IU] via SUBCUTANEOUS

## 2012-08-19 MED ORDER — CITALOPRAM HYDROBROMIDE 20 MG PO TABS
20.0000 mg | ORAL_TABLET | Freq: Every day | ORAL | Status: DC
  Administered 2012-08-20 – 2012-09-04 (×16): 20 mg via ORAL
  Filled 2012-08-19 (×16): qty 1

## 2012-08-19 MED ORDER — ACETAMINOPHEN 650 MG RE SUPP: 650.0000 mg | Freq: Four times a day (QID) | RECTAL | Status: DC | PRN

## 2012-08-19 MED ORDER — INSULIN ASPART 100 UNIT/ML ~~LOC~~ SOLN
0.0000 [IU] | Freq: Every day | SUBCUTANEOUS | Status: DC
  Filled 2012-08-19: qty 1

## 2012-08-19 MED ORDER — PIPERACILLIN-TAZOBACTAM 3.375 G IVPB
3.3750 g | Freq: Three times a day (TID) | INTRAVENOUS | Status: DC
  Administered 2012-08-20 – 2012-09-03 (×43): 3.375 g via INTRAVENOUS
  Filled 2012-08-19 (×44): qty 50

## 2012-08-19 MED ORDER — HYDROMORPHONE HCL PF 1 MG/ML IJ SOLN
1.0000 mg | Freq: Once | INTRAMUSCULAR | Status: AC
  Administered 2012-08-19: 1 mg via INTRAVENOUS
  Filled 2012-08-19: qty 1

## 2012-08-19 MED ORDER — ONDANSETRON HCL 4 MG/2ML IJ SOLN
4.0000 mg | Freq: Once | INTRAMUSCULAR | Status: AC
  Administered 2012-08-19: 4 mg via INTRAVENOUS
  Filled 2012-08-19: qty 2

## 2012-08-19 MED ORDER — LIP MEDEX EX OINT
1.0000 "application " | TOPICAL_OINTMENT | Freq: Two times a day (BID) | CUTANEOUS | Status: DC
  Administered 2012-08-19 – 2012-09-04 (×31): 1 via TOPICAL
  Filled 2012-08-19 (×2): qty 7

## 2012-08-19 MED ORDER — ALBUMIN HUMAN 5 % IV SOLN
25.0000 g | Freq: Once | INTRAVENOUS | Status: AC
  Administered 2012-08-20: 25 g via INTRAVENOUS
  Filled 2012-08-19: qty 500

## 2012-08-19 MED ORDER — LACTATED RINGERS IV SOLN: INTRAVENOUS | Status: DC

## 2012-08-19 MED ORDER — SODIUM CHLORIDE 0.9 % IV SOLN: 1000.0000 mL | INTRAVENOUS | Status: DC

## 2012-08-19 MED ORDER — ACETAMINOPHEN 325 MG PO TABS
650.0000 mg | ORAL_TABLET | Freq: Four times a day (QID) | ORAL | Status: DC | PRN
  Administered 2012-08-20 (×3): 650 mg via ORAL
  Filled 2012-08-19 (×3): qty 2

## 2012-08-19 MED ORDER — DEXTROSE-NACL 5-0.45 % IV SOLN: INTRAVENOUS | Status: DC

## 2012-08-19 MED ORDER — LORAZEPAM BOLUS VIA INFUSION
0.5000 mg | Freq: Three times a day (TID) | INTRAVENOUS | Status: DC | PRN
  Filled 2012-08-19: qty 1

## 2012-08-19 MED ORDER — ONDANSETRON HCL 4 MG/2ML IJ SOLN
4.0000 mg | Freq: Four times a day (QID) | INTRAMUSCULAR | Status: DC | PRN
  Filled 2012-08-19 (×3): qty 2

## 2012-08-19 MED ORDER — MAGIC MOUTHWASH
15.0000 mL | Freq: Four times a day (QID) | ORAL | Status: DC | PRN
  Administered 2012-08-23: 15 mL via ORAL
  Filled 2012-08-19: qty 15

## 2012-08-19 MED ORDER — LACTATED RINGERS IV BOLUS (SEPSIS): 1000.0000 mL | Freq: Three times a day (TID) | INTRAVENOUS | Status: AC | PRN

## 2012-08-19 MED ORDER — DIPHENHYDRAMINE HCL 50 MG/ML IJ SOLN: 12.5000 mg | Freq: Four times a day (QID) | INTRAMUSCULAR | Status: DC | PRN

## 2012-08-19 MED ORDER — PROMETHAZINE HCL 25 MG/ML IJ SOLN
12.5000 mg | Freq: Four times a day (QID) | INTRAMUSCULAR | Status: DC | PRN
  Filled 2012-08-19: qty 1

## 2012-08-19 MED ORDER — INSULIN ASPART 100 UNIT/ML ~~LOC~~ SOLN: 0.0000 [IU] | SUBCUTANEOUS | Status: DC

## 2012-08-19 MED ORDER — INSULIN GLARGINE 100 UNIT/ML ~~LOC~~ SOLN
10.0000 [IU] | Freq: Every day | SUBCUTANEOUS | Status: DC
  Administered 2012-08-19: 10 [IU] via SUBCUTANEOUS
  Filled 2012-08-19: qty 1

## 2012-08-19 MED ORDER — SODIUM CHLORIDE 0.9 % IV BOLUS (SEPSIS)
1000.0000 mL | Freq: Once | INTRAVENOUS | Status: AC
  Administered 2012-08-19: 1000 mL via INTRAVENOUS

## 2012-08-19 MED ORDER — DIPHENHYDRAMINE HCL 12.5 MG/5ML PO ELIX: 12.5000 mg | ORAL_SOLUTION | Freq: Four times a day (QID) | ORAL | Status: DC | PRN

## 2012-08-19 MED ORDER — ONDANSETRON HCL 4 MG/2ML IJ SOLN
4.0000 mg | Freq: Four times a day (QID) | INTRAMUSCULAR | Status: DC | PRN
  Administered 2012-08-24 – 2012-09-03 (×8): 4 mg via INTRAVENOUS
  Filled 2012-08-19 (×6): qty 2

## 2012-08-19 MED ORDER — SODIUM CHLORIDE 0.9 % IV SOLN
1000.0000 mL | Freq: Once | INTRAVENOUS | Status: AC
  Administered 2012-08-19: 1000 mL via INTRAVENOUS

## 2012-08-19 MED ORDER — METOPROLOL TARTRATE 1 MG/ML IV SOLN: 5.0000 mg | Freq: Four times a day (QID) | INTRAVENOUS | Status: DC | PRN

## 2012-08-19 MED ORDER — ONDANSETRON HCL 4 MG/2ML IJ SOLN: 4.0000 mg | Freq: Four times a day (QID) | INTRAMUSCULAR | Status: DC | PRN

## 2012-08-19 MED ORDER — INSULIN REGULAR BOLUS VIA INFUSION
0.0000 [IU] | Freq: Three times a day (TID) | INTRAVENOUS | Status: DC
  Filled 2012-08-19: qty 10

## 2012-08-19 MED ORDER — BISACODYL 10 MG RE SUPP: 10.0000 mg | Freq: Two times a day (BID) | RECTAL | Status: DC | PRN

## 2012-08-19 MED ORDER — INSULIN ASPART 100 UNIT/ML ~~LOC~~ SOLN: 0.0000 [IU] | Freq: Three times a day (TID) | SUBCUTANEOUS | Status: DC

## 2012-08-19 MED ORDER — INSULIN ASPART 100 UNIT/ML ~~LOC~~ SOLN: 0.0000 [IU] | Freq: Every day | SUBCUTANEOUS | Status: DC

## 2012-08-19 MED ORDER — HYDROMORPHONE HCL PF 1 MG/ML IJ SOLN
0.5000 mg | INTRAMUSCULAR | Status: DC | PRN
  Administered 2012-08-20 (×3): 1 mg via INTRAVENOUS
  Administered 2012-08-20: 0.5 mg via INTRAVENOUS
  Administered 2012-08-20 (×2): 1 mg via INTRAVENOUS
  Administered 2012-08-21: 0.5 mg via INTRAVENOUS
  Administered 2012-08-21: 1 mg via INTRAVENOUS
  Administered 2012-08-21 (×2): 0.5 mg via INTRAVENOUS
  Administered 2012-08-21: 1 mg via INTRAVENOUS
  Administered 2012-08-22: 0.5 mg via INTRAVENOUS
  Administered 2012-08-22: 1 mg via INTRAVENOUS
  Administered 2012-08-22 (×2): 0.5 mg via INTRAVENOUS
  Administered 2012-08-22: 1 mg via INTRAVENOUS
  Administered 2012-08-22: 0.5 mg via INTRAVENOUS
  Administered 2012-08-22 – 2012-08-26 (×32): 1 mg via INTRAVENOUS
  Administered 2012-08-27: 2 mg via INTRAVENOUS
  Administered 2012-08-27: 1 mg via INTRAVENOUS
  Administered 2012-08-27 (×2): 2 mg via INTRAVENOUS
  Administered 2012-08-27: 1 mg via INTRAVENOUS
  Administered 2012-08-27: 2 mg via INTRAVENOUS
  Administered 2012-08-27 (×2): 1 mg via INTRAVENOUS
  Administered 2012-08-28: 2 mg via INTRAVENOUS
  Administered 2012-08-28: 1 mg via INTRAVENOUS
  Administered 2012-08-28: 2 mg via INTRAVENOUS
  Administered 2012-08-28 (×2): 1 mg via INTRAVENOUS
  Administered 2012-08-28 – 2012-08-29 (×9): 2 mg via INTRAVENOUS
  Administered 2012-08-30: 1 mg via INTRAVENOUS
  Administered 2012-08-30 (×3): 2 mg via INTRAVENOUS
  Administered 2012-08-30 – 2012-08-31 (×4): 1 mg via INTRAVENOUS
  Administered 2012-08-31 (×2): 2 mg via INTRAVENOUS
  Administered 2012-08-31: 1 mg via INTRAVENOUS
  Administered 2012-09-01 – 2012-09-03 (×19): 2 mg via INTRAVENOUS
  Administered 2012-09-03 (×3): 1 mg via INTRAVENOUS
  Administered 2012-09-03 – 2012-09-04 (×2): 2 mg via INTRAVENOUS
  Administered 2012-09-04 (×2): 1 mg via INTRAVENOUS
  Filled 2012-08-19 (×4): qty 1
  Filled 2012-08-19: qty 2
  Filled 2012-08-19 (×5): qty 1
  Filled 2012-08-19: qty 2
  Filled 2012-08-19 (×3): qty 1
  Filled 2012-08-19: qty 2
  Filled 2012-08-19 (×2): qty 1
  Filled 2012-08-19 (×4): qty 2
  Filled 2012-08-19: qty 1
  Filled 2012-08-19: qty 2
  Filled 2012-08-19 (×3): qty 1
  Filled 2012-08-19 (×3): qty 2
  Filled 2012-08-19 (×3): qty 1
  Filled 2012-08-19: qty 2
  Filled 2012-08-19: qty 1
  Filled 2012-08-19: qty 2
  Filled 2012-08-19: qty 1
  Filled 2012-08-19 (×2): qty 2
  Filled 2012-08-19 (×3): qty 1
  Filled 2012-08-19 (×2): qty 2
  Filled 2012-08-19 (×2): qty 1
  Filled 2012-08-19: qty 2
  Filled 2012-08-19: qty 1
  Filled 2012-08-19: qty 2
  Filled 2012-08-19 (×5): qty 1
  Filled 2012-08-19: qty 2
  Filled 2012-08-19 (×3): qty 1
  Filled 2012-08-19: qty 2
  Filled 2012-08-19: qty 1
  Filled 2012-08-19 (×4): qty 2
  Filled 2012-08-19 (×2): qty 1
  Filled 2012-08-19 (×2): qty 2
  Filled 2012-08-19 (×3): qty 1
  Filled 2012-08-19: qty 2
  Filled 2012-08-19 (×2): qty 1
  Filled 2012-08-19: qty 2
  Filled 2012-08-19 (×3): qty 1
  Filled 2012-08-19: qty 2
  Filled 2012-08-19 (×3): qty 1
  Filled 2012-08-19 (×3): qty 2
  Filled 2012-08-19 (×4): qty 1
  Filled 2012-08-19 (×3): qty 2
  Filled 2012-08-19 (×2): qty 1
  Filled 2012-08-19: qty 2
  Filled 2012-08-19 (×2): qty 1
  Filled 2012-08-19: qty 2
  Filled 2012-08-19 (×4): qty 1
  Filled 2012-08-19: qty 2
  Filled 2012-08-19 (×2): qty 1
  Filled 2012-08-19 (×2): qty 2
  Filled 2012-08-19 (×2): qty 1

## 2012-08-19 MED ORDER — LACTATED RINGERS IV BOLUS (SEPSIS)
1000.0000 mL | Freq: Once | INTRAVENOUS | Status: AC
  Administered 2012-08-19: 1000 mL via INTRAVENOUS

## 2012-08-19 MED ORDER — FLUCONAZOLE IN SODIUM CHLORIDE 400-0.9 MG/200ML-% IV SOLN
400.0000 mg | INTRAVENOUS | Status: DC
  Administered 2012-08-19 – 2012-08-28 (×10): 400 mg via INTRAVENOUS
  Filled 2012-08-19 (×11): qty 200

## 2012-08-19 MED ORDER — PIPERACILLIN-TAZOBACTAM 3.375 G IVPB
3.3750 g | Freq: Once | INTRAVENOUS | Status: AC
  Administered 2012-08-19: 3.375 g via INTRAVENOUS
  Filled 2012-08-19: qty 50

## 2012-08-19 MED ORDER — DEXTROSE 50 % IV SOLN
25.0000 mL | INTRAVENOUS | Status: DC | PRN
  Filled 2012-08-19: qty 50

## 2012-08-19 MED ORDER — ALUM & MAG HYDROXIDE-SIMETH 200-200-20 MG/5ML PO SUSP: 30.0000 mL | Freq: Four times a day (QID) | ORAL | Status: DC | PRN

## 2012-08-19 MED ORDER — SODIUM CHLORIDE 0.9 % IV SOLN
INTRAVENOUS | Status: DC
  Administered 2012-08-19: 21:00:00 via INTRAVENOUS

## 2012-08-19 NOTE — ED Notes (Signed)
Started having flank pain 5 days ago, with fever and chills, nausea/vomiting, thought she had the flu, but pain got worse in flank area. Nausea,

## 2012-08-19 NOTE — ED Provider Notes (Addendum)
History     CSN: [id number]  Arrival date & time 08/19/12  1615   First MD Initiated Contact with Patient 08/19/12 1724      Chief Complaint  Patient presents with  . Flank Pain  . Fever  . Hyperglycemia  . Diabetic Ketoacidosis    (Consider location/radiation/quality/duration/timing/severity/associated sxs/prior treatment) HPI  Presents today complaining of having flank pain for 5 days with fever chills nausea and vomiting. She has continued to have pain in the flank area with dysuria but no frequency of urination. She is a diabetic. She states her blood sugars have been high. The pain has been constant in nature radiating around the side on the right. She has vomited multiple times today. She has had difficulty keeping fluids down today. Pain is worsening in nature. She's not had similar events in the past.  History reviewed. No pertinent past medical history.  History reviewed. No pertinent past surgical history.  No family history on file.  History  Substance Use Topics  . Smoking status: Never Smoker   . Smokeless tobacco: Not on file  . Alcohol Use: No    OB History    Grav Para Term Preterm Abortions TAB SAB Ect Mult Living                  Review of Systems  Constitutional: Negative for fever, chills, activity change, appetite change and unexpected weight change.  HENT: Negative for sore throat, rhinorrhea, neck pain, neck stiffness and sinus pressure.   Eyes: Negative for visual disturbance.  Respiratory: Negative for cough and shortness of breath.   Cardiovascular: Negative for chest pain and leg swelling.  Gastrointestinal: Negative for vomiting, abdominal pain, diarrhea and blood in stool.  Genitourinary: Negative for dysuria, urgency, frequency, vaginal discharge and difficulty urinating.  Musculoskeletal: Negative for myalgias, arthralgias and gait problem.  Skin: Negative for color change and rash.  Neurological: Negative for weakness,  light-headedness and headaches.  Hematological: Does not bruise/bleed easily.  Psychiatric/Behavioral: Negative for dysphoric mood.    Allergies  Review of patient's allergies indicates no known allergies.  Home Medications   Current Outpatient Rx  Name Route Sig Dispense Refill  . ACETAMINOPHEN 500 MG PO TABS Oral Take 1,000 mg by mouth every 6 (six) hours as needed.    Marland Kitchen CITALOPRAM HYDROBROMIDE 20 MG PO TABS Oral Take 20 mg by mouth daily.    Marland Kitchen GLIPIZIDE ER 10 MG PO TB24 Oral Take 10 mg by mouth daily.    Marland Kitchen LISINOPRIL 2.5 MG PO TABS Oral Take 2.5 mg by mouth daily.    Marland Kitchen METFORMIN HCL 500 MG PO TABS Oral Take 500 mg by mouth 2 (two) times daily with a meal.      BP 103/64  Pulse 130  Temp 100.5 F (38.1 C) (Oral)  Resp 30  SpO2 100%  LMP 08/13/2012  Physical Exam  Nursing note and vitals reviewed. Constitutional: She appears well-developed and well-nourished.       Morbidly obese  HENT:  Head: Normocephalic and atraumatic.       Mucous membranes are dry  Eyes: Conjunctivae normal and EOM are normal. Pupils are equal, round, and reactive to light.  Neck: Normal range of motion. Neck supple.  Cardiovascular: Tachycardia present.   Pulmonary/Chest: Effort normal and breath sounds normal.  Abdominal: Soft. There is tenderness.       Diffuse tenderness on right side of abdomen  Genitourinary:       Right CVA tenderness.  Musculoskeletal:  Normal range of motion.  Neurological: She is alert.  Skin: Skin is warm.  Psychiatric: She has a normal mood and affect.    ED Course  Procedures (including critical care time)  Labs Reviewed  CBC WITH DIFFERENTIAL - Abnormal; Notable for the following:    WBC 19.0 (*)     Hemoglobin 11.4 (*)     HCT 33.3 (*)     Platelets 563 (*)     Neutrophils Relative 85 (*)     Neutro Abs 16.2 (*)     Lymphocytes Relative 8 (*)     Monocytes Absolute 1.2 (*)     All other components within normal limits  COMPREHENSIVE METABOLIC PANEL -  Abnormal; Notable for the following:    Sodium 131 (*)     Chloride 91 (*)     Glucose, Bld 374 (*)     Creatinine, Ser 1.23 (*)     Total Protein 8.6 (*)     Albumin 2.8 (*)     Alkaline Phosphatase 168 (*)     GFR calc non Af Amer 58 (*)     GFR calc Af Amer 67 (*)     All other components within normal limits  URINALYSIS, ROUTINE W REFLEX MICROSCOPIC - Abnormal; Notable for the following:    Color, Urine AMBER (*)  BIOCHEMICALS MAY BE AFFECTED BY COLOR   APPearance CLOUDY (*)     Glucose, UA 250 (*)     Bilirubin Urine LARGE (*)     Ketones, ur TRACE (*)     Protein, ur 100 (*)     Leukocytes, UA MODERATE (*)     All other components within normal limits  BLOOD GAS, VENOUS - Abnormal; Notable for the following:    pH, Ven 7.418 (*)     pCO2, Ven 37.5 (*)     pO2, Ven 16.6 (*)     All other components within normal limits  URINE MICROSCOPIC-ADD ON - Abnormal; Notable for the following:    Squamous Epithelial / LPF FEW (*)     Bacteria, UA MANY (*)     All other components within normal limits  LIPASE, BLOOD  PREGNANCY, URINE  LACTIC ACID, PLASMA   No results found.   No diagnosis found.  Results for orders placed during the hospital encounter of 08/19/12  CBC WITH DIFFERENTIAL      Component Value Range   WBC 19.0 (*) 4.0 - 10.5 K/uL   RBC 4.02  3.87 - 5.11 MIL/uL   Hemoglobin 11.4 (*) 12.0 - 15.0 g/dL   HCT 33.3 (*) 36.0 - 46.0 %   MCV 82.8  78.0 - 100.0 fL   MCH 28.4  26.0 - 34.0 pg   MCHC 34.2  30.0 - 36.0 g/dL   RDW 12.9  11.5 - 15.5 %   Platelets 563 (*) 150 - 400 K/uL   Neutrophils Relative 85 (*) 43 - 77 %   Neutro Abs 16.2 (*) 1.7 - 7.7 K/uL   Lymphocytes Relative 8 (*) 12 - 46 %   Lymphs Abs 1.6  0.7 - 4.0 K/uL   Monocytes Relative 6  3 - 12 %   Monocytes Absolute 1.2 (*) 0.1 - 1.0 K/uL   Eosinophils Relative 0  0 - 5 %   Eosinophils Absolute 0.0  0.0 - 0.7 K/uL   Basophils Relative 0  0 - 1 %   Basophils Absolute 0.0  0.0 - 0.1 K/uL    COMPREHENSIVE METABOLIC  PANEL      Component Value Range   Sodium 131 (*) 135 - 145 mEq/L   Potassium 4.0  3.5 - 5.1 mEq/L   Chloride 91 (*) 96 - 112 mEq/L   CO2 22  19 - 32 mEq/L   Glucose, Bld 374 (*) 70 - 99 mg/dL   BUN 11  6 - 23 mg/dL   Creatinine, Ser 1.23 (*) 0.50 - 1.10 mg/dL   Calcium 9.4  8.4 - 10.5 mg/dL   Total Protein 8.6 (*) 6.0 - 8.3 g/dL   Albumin 2.8 (*) 3.5 - 5.2 g/dL   AST 9  0 - 37 U/L   ALT 14  0 - 35 U/L   Alkaline Phosphatase 168 (*) 39 - 117 U/L   Total Bilirubin 0.3  0.3 - 1.2 mg/dL   GFR calc non Af Amer 58 (*) >90 mL/min   GFR calc Af Amer 67 (*) >90 mL/min  LIPASE, BLOOD      Component Value Range   Lipase 19  11 - 59 U/L  URINALYSIS, ROUTINE W REFLEX MICROSCOPIC      Component Value Range   Color, Urine AMBER (*) YELLOW   APPearance CLOUDY (*) CLEAR   Specific Gravity, Urine 1.029  1.005 - 1.030   pH 5.0  5.0 - 8.0   Glucose, UA 250 (*) NEGATIVE mg/dL   Hgb urine dipstick NEGATIVE  NEGATIVE   Bilirubin Urine LARGE (*) NEGATIVE   Ketones, ur TRACE (*) NEGATIVE mg/dL   Protein, ur 100 (*) NEGATIVE mg/dL   Urobilinogen, UA 1.0  0.0 - 1.0 mg/dL   Nitrite NEGATIVE  NEGATIVE   Leukocytes, UA MODERATE (*) NEGATIVE  PREGNANCY, URINE      Component Value Range   Preg Test, Ur NEGATIVE  NEGATIVE  BLOOD GAS, VENOUS      Component Value Range   pH, Ven 7.418 (*) 7.250 - 7.300   pCO2, Ven 37.5 (*) 45.0 - 50.0 mmHg   pO2, Ven 16.6 (*) 30.0 - 45.0 mmHg   Bicarbonate 23.8  20.0 - 24.0 mEq/L   TCO2 21.9  0 - 100 mmol/L   Acid-base deficit 0.0  0.0 - 2.0 mmol/L   O2 Saturation 18.6     Patient temperature 98.6     Collection site VEIN     Drawn by COLLECTED BY LABORATORY     Sample type VENOUS    LACTIC ACID, PLASMA      Component Value Range   Lactic Acid, Venous 1.7  0.5 - 2.2 mmol/L  URINE MICROSCOPIC-ADD ON      Component Value Range   Squamous Epithelial / LPF FEW (*) RARE   WBC, UA 21-50  <3 WBC/hpf   RBC / HPF 0-2  <3 RBC/hpf   Bacteria,  UA MANY (*) RARE     MDM  Patient received 2 L of normal saline and repeat heart rate is 120. Patient is sitting in bed awake and alert. Rocephin 2 g have been ordered. Urine is being cultured. Pain has improved. Blood sugar is to be rechecked now. Results of abdomen and pelvis CT are pending.   8:21 PM CT results called to me by radiology.  Report of probable ruptured appendix with free air in abdomen.  Zosyn added to regimen.  Surgery consulted and Dr. Johney Maine will be in to evaluate.    CRITICAL CARE Performed by: Shaune Pollack   Total critical care time: 45  Critical care time was exclusive of separately billable  procedures and treating other patients.  Critical care was necessary to treat or prevent imminent or life-threatening deterioration.  Critical care was time spent personally by me on the following activities: development of treatment plan with patient and/or surrogate as well as nursing, discussions with consultants, evaluation of patient's response to treatment, examination of patient, obtaining history from patient or surrogate, ordering and performing treatments and interventions, ordering and review of laboratory studies, ordering and review of radiographic studies, pulse oximetry and re-evaluation of patient's condition.     Shaune Pollack, MD 08/19/12 2022  Patient's care discussed with Dr. Johney Maine and he asked that hospitalists be consulted to help manage patient.  I called and discussed with hospitalist and they will see and evaluate.   Shaune Pollack, MD 08/19/12 [phone]
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
## 2012-08-19 NOTE — ED Notes (Signed)
Pt. Stated that she did not want a foley cath that she would try to use a bed pan due to the fact of bedrest orders. Pt also stated that if the pain was to bad that she would request the foley at a later time.
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
## 2012-08-19 NOTE — ED Notes (Signed)
Dr. Jeanell Sparrow made aware of pt's CBG and current VS.
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
## 2012-08-20 ENCOUNTER — Inpatient Hospital Stay (HOSPITAL_COMMUNITY): Payer: Medicaid Other

## 2012-08-20 ENCOUNTER — Encounter (HOSPITAL_COMMUNITY): Payer: Self-pay | Admitting: *Deleted

## 2012-08-20 DIAGNOSIS — D649 Anemia, unspecified: Secondary | ICD-10-CM

## 2012-08-20 DIAGNOSIS — IMO0001 Reserved for inherently not codable concepts without codable children: Secondary | ICD-10-CM

## 2012-08-20 DIAGNOSIS — I10 Essential (primary) hypertension: Secondary | ICD-10-CM

## 2012-08-20 DIAGNOSIS — N390 Urinary tract infection, site not specified: Secondary | ICD-10-CM

## 2012-08-20 LAB — CBC WITH DIFFERENTIAL/PLATELET
Basophils Absolute: 0 10*3/uL (ref 0.0–0.1)
Basophils Relative: 0 % (ref 0–1)
Eosinophils Relative: 0 % (ref 0–5)
Lymphocytes Relative: 10 % — ABNORMAL LOW (ref 12–46)
MCHC: 34 g/dL (ref 30.0–36.0)
MCV: 82.5 fL (ref 78.0–100.0)
Monocytes Absolute: 1.5 10*3/uL — ABNORMAL HIGH (ref 0.1–1.0)
Neutro Abs: 16.6 10*3/uL — ABNORMAL HIGH (ref 1.7–7.7)
Platelets: 421 10*3/uL — ABNORMAL HIGH (ref 150–400)
RDW: 13.1 % (ref 11.5–15.5)
WBC: 20.1 10*3/uL — ABNORMAL HIGH (ref 4.0–10.5)

## 2012-08-20 LAB — COMPREHENSIVE METABOLIC PANEL
ALT: 10 U/L (ref 0–35)
Alkaline Phosphatase: 121 U/L — ABNORMAL HIGH (ref 39–117)
BUN: 7 mg/dL (ref 6–23)
Chloride: 101 mEq/L (ref 96–112)
GFR calc Af Amer: >90 mL/min (ref >90)
Glucose, Bld: 238 mg/dL — ABNORMAL HIGH (ref 70–99)
Potassium: 3.5 mEq/L (ref 3.5–5.1)
Sodium: 135 mEq/L (ref 135–145)
Total Bilirubin: 0.3 mg/dL (ref 0.3–1.2)

## 2012-08-20 LAB — GLUCOSE, CAPILLARY: Glucose-Capillary: 225 mg/dL — ABNORMAL HIGH (ref 70–99)

## 2012-08-20 LAB — LIPID PANEL
Cholesterol: 109 mg/dL (ref 0–200)
Total CHOL/HDL Ratio: 9.9 RATIO

## 2012-08-20 LAB — CBC
HCT: 27 % — ABNORMAL LOW (ref 36.0–46.0)
Hemoglobin: 9.3 g/dL — ABNORMAL LOW (ref 12.0–15.0)
MCHC: 34.4 g/dL (ref 30.0–36.0)
WBC: 23.5 10*3/uL — ABNORMAL HIGH (ref 4.0–10.5)

## 2012-08-20 LAB — HEMOGLOBIN A1C: Hgb A1c MFr Bld: 13 % — ABNORMAL HIGH (ref <5.7)

## 2012-08-20 MED ORDER — ACETAMINOPHEN 325 MG PO TABS
650.0000 mg | ORAL_TABLET | ORAL | Status: DC | PRN
  Administered 2012-08-20 – 2012-08-25 (×11): 650 mg via ORAL
  Filled 2012-08-20 (×11): qty 2

## 2012-08-20 MED ORDER — MIDAZOLAM HCL 5 MG/5ML IJ SOLN
INTRAMUSCULAR | Status: AC | PRN
  Administered 2012-08-20 (×3): 2 mg via INTRAVENOUS

## 2012-08-20 MED ORDER — MIDAZOLAM HCL 2 MG/2ML IJ SOLN
INTRAMUSCULAR | Status: AC
Start: 2012-08-20 — End: 2012-08-21
  Filled 2012-08-20: qty 4

## 2012-08-20 MED ORDER — SODIUM CHLORIDE 0.9 % IV BOLUS (SEPSIS)
1000.0000 mL | Freq: Once | INTRAVENOUS | Status: AC
  Administered 2012-08-20: 1000 mL via INTRAVENOUS

## 2012-08-20 MED ORDER — FENTANYL CITRATE 0.05 MG/ML IJ SOLN
INTRAMUSCULAR | Status: AC | PRN
  Administered 2012-08-20 (×4): 100 ug via INTRAVENOUS

## 2012-08-20 MED ORDER — POTASSIUM CHLORIDE IN NACL 20-0.45 MEQ/L-% IV SOLN
INTRAVENOUS | Status: DC
  Administered 2012-08-20: 11:00:00 via INTRAVENOUS
  Administered 2012-08-21 – 2012-08-22 (×4): 150 mL/h via INTRAVENOUS
  Administered 2012-08-22 – 2012-08-23 (×2): via INTRAVENOUS
  Administered 2012-08-23 – 2012-08-24 (×2): 150 mL/h via INTRAVENOUS
  Administered 2012-08-24 – 2012-08-27 (×9): via INTRAVENOUS
  Administered 2012-08-29: 75 mL via INTRAVENOUS
  Administered 2012-08-29: 09:00:00 via INTRAVENOUS
  Administered 2012-09-01: 20 mL via INTRAVENOUS
  Administered 2012-09-03: 10:00:00 via INTRAVENOUS
  Filled 2012-08-20 (×37): qty 1000

## 2012-08-20 MED ORDER — INSULIN GLARGINE 100 UNIT/ML ~~LOC~~ SOLN
15.0000 [IU] | Freq: Every day | SUBCUTANEOUS | Status: DC
  Administered 2012-08-20 – 2012-08-26 (×7): 15 [IU] via SUBCUTANEOUS

## 2012-08-20 MED ORDER — FENTANYL CITRATE 0.05 MG/ML IJ SOLN
INTRAMUSCULAR | Status: AC
  Filled 2012-08-20: qty 4

## 2012-08-20 MED ORDER — MIDAZOLAM HCL 2 MG/2ML IJ SOLN
INTRAMUSCULAR | Status: AC
  Filled 2012-08-20: qty 4

## 2012-08-20 MED ORDER — ENOXAPARIN SODIUM 40 MG/0.4ML ~~LOC~~ SOLN
40.0000 mg | Freq: Every day | SUBCUTANEOUS | Status: DC
  Administered 2012-08-20 – 2012-09-03 (×15): 40 mg via SUBCUTANEOUS
  Filled 2012-08-20 (×16): qty 0.4

## 2012-08-20 MED ORDER — FENTANYL CITRATE 0.05 MG/ML IJ SOLN
INTRAMUSCULAR | Status: AC
  Filled 2012-08-20: qty 6

## 2012-08-20 MED ORDER — INFLUENZA VIRUS VACC SPLIT PF IM SUSP
0.5000 mL | Freq: Once | INTRAMUSCULAR | Status: AC
  Administered 2012-08-20: 0.5 mL via INTRAMUSCULAR
  Filled 2012-08-20: qty 0.5

## 2012-08-20 NOTE — Progress Notes (Signed)
Pt refuses foley cath states "I do not want anything going down there" attempted to explain the need for the catheter but pt continues to refuse

## 2012-08-20 NOTE — Progress Notes (Signed)
General Surgery Note  LOS: 1 day  Room - 1234  Assessment/Plan: 1.  RLQ abscess, multiloculated  History of bilateral oophorectomy and SB resection at [facility] in 2010 by Dr. Thurston Pounds.  Dr. Launa Flight was involved.  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  WBC - 20,100 - 08/20/2012  On Diflucan/Zosyn.  Plan perc drain - 08/20/2012.  2.  DIABETES MELLITUS, TYPE II, UNCONTROLLED   Gluc - 238 - 08/20/2012  HgbA1c pending 3.  URINALYSIS, ABNORMAL  Cultures pending   4. Obesity (BMI 30-39.9)  5  Anxiety (08/19/2012) 6.  Depression  7.  Was followed by [facility] - Dr. Casilda Carls  Now without PCP 8.  CKD (chronic kidney disease)   Creat - 0.92 - 08/20/2012 9.  Anemia  Hgb - 8.5 - 08/20/2012  Probably partially due to rehydration, but patient must have some chronic anemia.  Recheck tomorrow. 10.  DVT proph -  Will start post drainage.  History of DVT and filter  Notes from 03/16/2009  Subjective:  Has been sick about one week.  For perc drain later today.  Has been unemployed by 8 months.  Lives with sister.  Objective:   Filed Vitals:   08/20/12 0900  BP: 118/53  Pulse: 118  Temp:   Resp: 29     Intake/Output from previous day:  10/06 0701 - 10/07 0700 In: 800 [IV Piggyback:800] Out: 850 [Urine:850]  Intake/Output this shift:      Physical Exam:   General: [name] who is alert and oriented.    HEENT: Normal. Pupils equal. .   Lungs: Clear   Abdomen: tender lower abdomen, R>L.  Old well healed lower midline abdominal scar.   Neurologic:  Grossly intact to motor and sensory function.   Psychiatric: Has normal mood and affect.   Lab Results:    [facility] 08/20/12 0332 08/19/12 1735  WBC 20.1* 19.0*  HGB 8.5* 11.4*  HCT 25.0* 33.3*  PLT 421* 563*    BMET   Basename 08/20/12 0332 08/19/12 1735  NA 135 131*  K 3.5 4.0  CL 101 91*  CO2 22 22  GLUCOSE 238* 374*  BUN 7 11  CREATININE 0.92 1.23*  CALCIUM 8.0* 9.4    PT/INR  No results found for this basename:  LABPROT:2,INR:2 in the last 72 hours  ABG   Basename 08/19/12 1732  PHART --  HCO3 23.8     Studies/Results:  Ct Abdomen Pelvis W Contrast  08/19/2012  *RADIOLOGY REPORT*  Clinical Data: Lower abdominal pain for 3 days, nausea, vomiting, diarrhea  CT ABDOMEN AND PELVIS WITH CONTRAST  Technique:  Multidetector CT imaging of the abdomen and pelvis was performed following the standard protocol during bolus administration of intravenous contrast.  Contrast: 125mL OMNIPAQUE IOHEXOL 300 MG/ML  SOLN  Comparison: Abdominal CT - 11/02/2010; 02/16/2010  Findings:  Findings compatible with perforated complex appendicitis with the base of the appendix (coronal images 32 through 40) extending into a irregular multiseptated enhancing fluid collection within the right lower quadrant extending into the anterior pelvis.  Dominant organized collection measuring approximately 8.0 x 4.7 cm in greatest oblique axial dimension (image 68, series 2).  Multiple additional smaller loculated fluid collections extend into the more inferior abdomen and pelvis measuring approximately 11.8 x 7.4 cm in total (image 80, series 2).  These findings are associated with marked adjacent inflammatory change.  There is a minimal amount of extraluminal air within the lower abdomen (image 68).  No definite pneumatosis or portal  venous gas.  Stable postsurgical changes of the terminal ileum.  A minimal amount of enteric contrast extends into the mid small bowel.  No definite evidence of enteric obstruction.  There is a minimal amount of fluid within the endometrial canal, favored to be reactive.  No free fluid in the pelvic cul-de-sac.  Normal hepatic contour.  No discrete hepatic lesions.  The gallbladder is under distended otherwise normal.  No definite intra or extrahepatic biliary ductal dilatation.  No ascites.  There is symmetric enhancement the bilateral kidneys.  Mild asymmetric accentuated lobular contour of the right kidney is grossly  unchanged, and favored to be the sequela of prior episode of pyelonephritis as demonstrated on remote abdominal CT performed 02/16/2010.  No discrete renal lesions.  No definite renal stones on this postcontrast examination.  No urinary obstruction.  No perinephric stranding.  Normal appearance of the bilateral adrenal glands, pancreas and spleen.  Incidental note is made of a small splenule.  Limited visualization of the lower thorax is degraded secondary to patient motion artifact but suggests bibasilar atelectasis, left greater than right.  No discrete focal airspace opacities.  No definite pleural effusion.  Normal heart size.  No pericardial effusion.  No acute or aggressive osseous abnormalities.  IMPRESSION:  1. Findings compatible with a complex perforated appendicitis with dominant organized fluid collection measuring approximately 8 x 4.7 cm. There are multiple additional peripheral enhancing fluid collections extending into the right lower abdominal quadrant/pelvis. There is extensive inflammatory change and a small amount of free air within the lower abdomen.  2.  Mild asymmetric lobular contour of the right kidney, the sequela of prior episode of pyelonephritis. No CT evidence of recurrent pyelonephritis or urinary obstruction.  Above findings discussed with Dr. Jeanell Sparrow at 2008.   Original Report Authenticated By: Rachel Moulds, M.D.    Dg Chest Port 1 View  08/19/2012  *RADIOLOGY REPORT*  Clinical Data: 31 year old female shortness of breath.  PORTABLE CHEST - 1 VIEW  Comparison: 02/18/2010 and earlier.  Findings: Portable AP view 2255 hours.  Continued low lung volumes. Allowing for portable technique the lungs are clear.  No pneumothorax.  Cardiac size and mediastinal contours are within normal limits.  Visualized tracheal air column is within normal limits.  IMPRESSION: Continued low lung volumes, otherwise no acute cardiopulmonary abnormality.   Original Report Authenticated By: Randall An,  M.D.      Anti-infectives:   Anti-infectives     Start     Dose/Rate Route Frequency Ordered Stop   08/20/12 0500  piperacillin-tazobactam (ZOSYN) IVPB 3.375 g       3.375 g 12.5 mL/hr over 240 Minutes Intravenous Every 8 hours 08/19/12 2210     08/19/12 2300   fluconazole (DIFLUCAN) IVPB 400 mg        400 mg 200 mL/hr over 60 Minutes Intravenous Every 24 hours 08/19/12 2145     08/19/12 2100  piperacillin-tazobactam (ZOSYN) IVPB 3.375 g       3.375 g 12.5 mL/hr over 240 Minutes Intravenous  Once 08/19/12 2014 08/20/12 0048   08/19/12 2000   cefTRIAXone (ROCEPHIN) 2 g in dextrose 5 % 50 mL IVPB        2 g 100 mL/hr over 30 Minutes Intravenous To Emergency Dept 08/19/12 1915 08/19/12 2035          Alphonsa Overall, MD, [place] Pager: [phone],   [place] [facility] Office: [phone] 08/20/2012

## 2012-08-20 NOTE — Progress Notes (Signed)
Pt arrived back from CT after ct guided placement of two abscess drains to right abd. Pt a&ox4, VSS, dressing is c/d/i at site, pt is still in some pain, will medicate prn as ordered and continue to monitor.

## 2012-08-20 NOTE — Progress Notes (Signed)
Notified CCS MD of temp of 103.7, HR 135, B/P 9745 orders received for bolus, foley, tylenol frequency changes. Will continue to monitor closely

## 2012-08-20 NOTE — Progress Notes (Signed)
CARE MANAGEMENT NOTE 08/20/2012  Patient:  [name], [name]   Account Number:  [id number]  Date Initiated:  08/20/2012  Documentation initiated by:  Karel Mowers  Subjective/Objective Assessment:   patient with very complex medical and surgical history, [facility] patient in the past, now present with pelvic mass and poss perf. appendix.     Action/Plan:   home  [employer]   Anticipated DC Date:  08/23/2012   Anticipated DC Plan:  HOME/SELF CARE  In-house referral  Clinical Social Worker      DC Planning Services  NA      [facility] Dt Choice  NA   Choice offered to / List presented to:  NA   DME arranged  NA      DME agency  NA     [place] arranged  NA      [place] agency  NA   Status of service:  In process, will continue to follow Medicare Important Message given?  NA - LOS <3 / Initial given by admissions (If response is "NO", the following Medicare IM given date fields will be blank) Date Medicare IM given:   Date Additional Medicare IM given:    Discharge Disposition:    Per UR Regulation:  Reviewed for med. necessity/level of care/duration of stay  If discussed at [place] of Stay Meetings, dates discussed:    Comments:  [id number]/Trinady Milewski Rosana Hoes, RN, BSN, CCM: CHART REVIEWED AND UPDATED. NO DISCHARGE NEEDS PRESENT AT THIS TIME. CASE MANAGEMENT [id number]

## 2012-08-20 NOTE — Consult Note (Signed)
[facility] CONSULT FOLLOW-UP NOTE  [name] [id number] DOB: 06/13/1981 DOA: 08/19/2012 PCP: Mack Hook, MD Requesting physician: Michael Boston, MD Attending service: General Surgery Reason for consultation: diabetes, assistance with medical management  Impression/Recommendations: 1. Sepsis--secondary to below. Blood cultures pending. Maintaining blood pressure. Continue Zosyn, IVF. 2. Suspected perforated appendicitis--management deferred general surgery. Agree with empiric Zosyn. 3. DM type 2--fair control, no DKA. Increase Lantus, continue SSI. Suspect uncontrolled, follow-up Hgb A1c. Reports compliance with Lantus and oral agents. Hold oral agents. 4. UTI--follow-up culture, continue Zosyn. 5. Normocytic anemia--11.4>>8/5, Hgb 7-9 02/2010. May be at baseline (concentrated on admission). Follow closely, serial CBC, Transfuse <7. 6. HTN--hold lisinopril until condition improves. 7. History of PE/DVT 2010--SCDs pending procedure and surgery evaluation.  8. History of bilateral oopherectomy and small bowel resection in 2010 at [facility] Status: Full code Family Communication: none present Disposition Plan: per surgery  [name], MD  [facility] Team 6 Pager [phone]. If 8PM-8AM, please contact night-coverage at [email or web address], password [facility] 08/20/2012, 8:51 AM  LOS: 1 day   Brief narrative: 31 year old woman with history of 5 days of fever, flank pain, chills, nausea, vomiting, dysuria. CT ab/pelvis suspicious for ruptured appendix with free air in abdomen. Admitted by surgery and medicine consulted for assistance with DM and medical issues.  Procedures:    Antibiotics:  Zosyn 10/6 >>  Fluconazole 10/6 >>  HPI/Subjective: Fever to 103 on Zosyn. Tachycardia, tachypneic. Pain controlled, no nausea or vomiting.  Objective: Filed Vitals:   08/20/12 0100 08/20/12 0200 08/20/12 0400 08/20/12 0800  BP: 101/58 103/54  109/57  Pulse: 123 119   118  Temp: 102.3 F (39.1 C) 102.6 F (39.2 C) 101.9 F (38.8 C) 103 F (39.4 C)  TempSrc:  Oral  Oral  Resp: 31 29  22   Height:      Weight:      SpO2: 95% 96%  97%    Intake/Output Summary (Last 24 hours) at 08/20/12 0851 Last data filed at 08/20/12 0600  Gross per 24 hour  Intake    800 ml  Output    850 ml  Net    -50 ml    Exam:  General:  Appears calm, mildly uncomfortable, non-toxic Eyes: pupils appear normal, normal lids, irises  ENT: grossly normal hearing Cardiovascular: RRR, no m/r/g. No LE edema. Respiratory: CTA bilaterally, no w/r/r. Normal respiratory effort. Skin: no rash or induration seen on limited exam Psychiatric: grossly normal mood and affect, speech fluent and appropriate  Data Reviewed: Basic Metabolic Panel:  Lab [id number] 0332 08/19/12 1735  NA 135 131*  K 3.5 4.0  CL 101 91*  CO2 22 22  GLUCOSE 238* 374*  BUN 7 11  CREATININE 0.92 1.23*  CALCIUM 8.0* 9.4  MG -- --  PHOS -- --   Liver Function Tests:  Lab 08/20/12 0332 08/19/12 1735  AST 8 9  ALT 10 14  ALKPHOS 121* 168*  BILITOT 0.3 0.3  PROT 6.6 8.6*  ALBUMIN 2.3* 2.8*    Lab 08/19/12 1735  LIPASE 19  AMYLASE --   CBC:  Lab 08/20/12 0332 08/19/12 1735  WBC 20.1* 19.0*  NEUTROABS 16.6* 16.2*  HGB 8.5* 11.4*  HCT 25.0* 33.3*  MCV 82.5 82.8  PLT 421* 563*   CBG:  Lab 08/20/12 0732 08/19/12 2308 08/19/12 2208 08/19/12 2057 08/19/12 2008  GLUCAP 189* 287* 291* 340* 322*    Recent Results (from the past 240 hour(s))  MRSA PCR SCREENING     Status:  Normal   Collection Time   08/20/12 12:22 AM      Component Value Range Status Comment   MRSA by PCR NEGATIVE  NEGATIVE Final      Studies: Ct Abdomen Pelvis W Contrast  08/19/2012  *RADIOLOGY REPORT*  Clinical Data: Lower abdominal pain for 3 days, nausea, vomiting, diarrhea  CT ABDOMEN AND PELVIS WITH CONTRAST  Technique:  Multidetector CT imaging of the abdomen and pelvis was performed following the standard  protocol during bolus administration of intravenous contrast.  Contrast: 161mL OMNIPAQUE IOHEXOL 300 MG/ML  SOLN  Comparison: Abdominal CT - 11/02/2010; 02/16/2010  Findings:  Findings compatible with perforated complex appendicitis with the base of the appendix (coronal images 32 through 40) extending into a irregular multiseptated enhancing fluid collection within the right lower quadrant extending into the anterior pelvis.  Dominant organized collection measuring approximately 8.0 x 4.7 cm in greatest oblique axial dimension (image 68, series 2).  Multiple additional smaller loculated fluid collections extend into the more inferior abdomen and pelvis measuring approximately 11.8 x 7.4 cm in total (image 80, series 2).  These findings are associated with marked adjacent inflammatory change.  There is a minimal amount of extraluminal air within the lower abdomen (image 68).  No definite pneumatosis or portal venous gas.  Stable postsurgical changes of the terminal ileum.  A minimal amount of enteric contrast extends into the mid small bowel.  No definite evidence of enteric obstruction.  There is a minimal amount of fluid within the endometrial canal, favored to be reactive.  No free fluid in the pelvic cul-de-sac.  Normal hepatic contour.  No discrete hepatic lesions.  The gallbladder is under distended otherwise normal.  No definite intra or extrahepatic biliary ductal dilatation.  No ascites.  There is symmetric enhancement the bilateral kidneys.  Mild asymmetric accentuated lobular contour of the right kidney is grossly unchanged, and favored to be the sequela of prior episode of pyelonephritis as demonstrated on remote abdominal CT performed 02/16/2010.  No discrete renal lesions.  No definite renal stones on this postcontrast examination.  No urinary obstruction.  No perinephric stranding.  Normal appearance of the bilateral adrenal glands, pancreas and spleen.  Incidental note is made of a small splenule.   Limited visualization of the lower thorax is degraded secondary to patient motion artifact but suggests bibasilar atelectasis, left greater than right.  No discrete focal airspace opacities.  No definite pleural effusion.  Normal heart size.  No pericardial effusion.  No acute or aggressive osseous abnormalities.  IMPRESSION:  1. Findings compatible with a complex perforated appendicitis with dominant organized fluid collection measuring approximately 8 x 4.7 cm. There are multiple additional peripheral enhancing fluid collections extending into the right lower abdominal quadrant/pelvis. There is extensive inflammatory change and a small amount of free air within the lower abdomen.  2.  Mild asymmetric lobular contour of the right kidney, the sequela of prior episode of pyelonephritis. No CT evidence of recurrent pyelonephritis or urinary obstruction.  Above findings discussed with Dr. Jeanell Sparrow at 2008.   Original Report Authenticated By: Rachel Moulds, M.D.    Dg Chest Port 1 View  08/19/2012  *RADIOLOGY REPORT*  Clinical Data: 31 year old female shortness of breath.  PORTABLE CHEST - 1 VIEW  Comparison: 02/18/2010 and earlier.  Findings: Portable AP view 2255 hours.  Continued low lung volumes. Allowing for portable technique the lungs are clear.  No pneumothorax.  Cardiac size and mediastinal contours are within normal limits.  Visualized  tracheal air column is within normal limits.  IMPRESSION: Continued low lung volumes, otherwise no acute cardiopulmonary abnormality.   Original Report Authenticated By: Randall An, M.D.    Scheduled Meds:   . sodium chloride  1,000 mL Intravenous Once   Followed by  . sodium chloride  1,000 mL Intravenous Once  . acetaminophen  650 mg Oral Once  . albumin human  25 g Intravenous Once  . cefTRIAXone (ROCEPHIN)  IV  2 g Intravenous To ER  . citalopram  20 mg Oral Daily  . fluconazole (DIFLUCAN) IV  400 mg Intravenous Q24H  .  HYDROmorphone (DILAUDID) injection  1  mg Intravenous Once  .  HYDROmorphone (DILAUDID) injection  1 mg Intravenous Once  .  HYDROmorphone (DILAUDID) injection  1 mg Intravenous Once  . influenza  inactive virus vaccine  0.5 mL Intramuscular Once  . insulin aspart  0-20 Units Subcutaneous Q4H  . insulin aspart  0-5 Units Subcutaneous QHS  . insulin glargine  10 Units Subcutaneous QHS  . lactated ringers  1,000 mL Intravenous Once  . lip balm  1 application Topical BID  . ondansetron (ZOFRAN) IV  4 mg Intravenous Once  . piperacillin-tazobactam (ZOSYN)  IV  3.375 g Intravenous Once  . piperacillin-tazobactam (ZOSYN)  IV  3.375 g Intravenous Q8H  . sodium chloride  1,000 mL Intravenous Once  . DISCONTD: insulin aspart  0-15 Units Subcutaneous TID WC  . DISCONTD: insulin aspart  0-20 Units Subcutaneous Q4H  . DISCONTD: insulin aspart  0-20 Units Subcutaneous TID WC  . DISCONTD: insulin aspart  0-5 Units Subcutaneous QHS  . DISCONTD: insulin regular  0-10 Units Intravenous TID WC   Continuous Infusions:   . DISCONTD: sodium chloride    . DISCONTD: sodium chloride 100 mL/hr at 08/19/12 2048  . DISCONTD: dextrose 5 % and 0.45% NaCl    . DISCONTD: insulin (NOVOLIN-R) infusion Stopped (08/19/12 2247)  . DISCONTD: lactated ringers       Active Problems:  DIABETES MELLITUS, TYPE II, UNCONTROLLED  URINALYSIS, ABNORMAL  Obesity (BMI 30-39.9)  Anxiety  Depression  Noncompliance to therapies / medications  Sepsis  CKD (chronic kidney disease)    Time Spent: 25 minutes  [name], MD  [facility] Team 6 Pager [phone]. If 8PM-8AM, please contact night-coverage at [email or web address], password [facility] 08/20/2012, 8:51 AM  LOS: 1 day

## 2012-08-20 NOTE — H&P (Signed)
Agree with PA note.  RLQ/pelvic abscess is highly loculated and may require more than 1 drain for adequate drainage.   Signed,  Criselda Peaches, MD Vascular & Interventional Radiologist [facility] Radiology
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
## 2012-08-20 NOTE — Progress Notes (Signed)
Reviewed EKG--incomplete LBBB.  Patient had previous LBBB noted on EKG on 02/15/10.  Went to see patient.  She is resting comfortably.  Denies any f/c/.  Denies cp, sob, n/v, dizziness.  HR 120-RR18-125/65, 99% RA.  CXR negative.  No new recommendations at this time.

## 2012-08-20 NOTE — Procedures (Signed)
Interventional Radiology Procedure Note  Procedure: Successful placement od CT guided abdominal drains x 2.  Drain #1 positioned in dominant fluid collection adjacent to cecum/appendix.  Almost 200 mL frankly purulent material successfully aspirated. Drain #2 positioned in heavily loculated collection just inferior to dominant fluid collection.  Approximately 30 mL turbid serosanguinous fluid aspirated.  Fluids were clearly different, this second collection may be a hydrosalpinx/TOA.   Complications: None Recommendations: - Maintain to suction bulb - Follow cx - Adjust abx as needed - Re-image with contrasted CT prior to removal of tubes   Signed,  Criselda Peaches, MD Vascular & Interventional Radiologist [facility]

## 2012-08-21 DIAGNOSIS — A419 Sepsis, unspecified organism: Secondary | ICD-10-CM

## 2012-08-21 LAB — GLUCOSE, CAPILLARY
Glucose-Capillary: 124 mg/dL — ABNORMAL HIGH (ref 70–99)
Glucose-Capillary: 149 mg/dL — ABNORMAL HIGH (ref 70–99)
Glucose-Capillary: 160 mg/dL — ABNORMAL HIGH (ref 70–99)
Glucose-Capillary: 163 mg/dL — ABNORMAL HIGH (ref 70–99)
Glucose-Capillary: 168 mg/dL — ABNORMAL HIGH (ref 70–99)

## 2012-08-21 LAB — BASIC METABOLIC PANEL
CO2: 18 mEq/L — ABNORMAL LOW (ref 19–32)
Calcium: 8.1 mg/dL — ABNORMAL LOW (ref 8.4–10.5)
Chloride: 104 mEq/L (ref 96–112)
Glucose, Bld: 179 mg/dL — ABNORMAL HIGH (ref 70–99)
Sodium: 136 mEq/L (ref 135–145)

## 2012-08-21 LAB — CBC WITH DIFFERENTIAL/PLATELET
Basophils Relative: 0 % (ref 0–1)
Eosinophils Absolute: 0 10*3/uL (ref 0.0–0.7)
HCT: 24.8 % — ABNORMAL LOW (ref 36.0–46.0)
Hemoglobin: 8.5 g/dL — ABNORMAL LOW (ref 12.0–15.0)
Lymphocytes Relative: 11 % — ABNORMAL LOW (ref 12–46)
MCH: 28.6 pg (ref 26.0–34.0)
MCHC: 34.3 g/dL (ref 30.0–36.0)
Monocytes Absolute: 0.9 10*3/uL (ref 0.1–1.0)
Neutro Abs: 14.8 10*3/uL — ABNORMAL HIGH (ref 1.7–7.7)

## 2012-08-21 LAB — LACTIC ACID, PLASMA: Lactic Acid, Venous: 0.8 mmol/L (ref 0.5–2.2)

## 2012-08-21 MED ORDER — CHLORHEXIDINE GLUCONATE 0.12 % MT SOLN
15.0000 mL | Freq: Two times a day (BID) | OROMUCOSAL | Status: DC
  Administered 2012-08-22 – 2012-09-04 (×22): 15 mL via OROMUCOSAL
  Filled 2012-08-21 (×29): qty 15

## 2012-08-21 MED ORDER — HYDROCODONE-ACETAMINOPHEN 5-325 MG PO TABS
1.0000 | ORAL_TABLET | ORAL | Status: DC | PRN
  Administered 2012-08-22 – 2012-09-04 (×4): 1 via ORAL
  Filled 2012-08-21 (×4): qty 1

## 2012-08-21 MED ORDER — BIOTENE DRY MOUTH MT LIQD
15.0000 mL | Freq: Two times a day (BID) | OROMUCOSAL | Status: DC
  Administered 2012-08-22 – 2012-09-03 (×20): 15 mL via OROMUCOSAL

## 2012-08-21 NOTE — Consult Note (Signed)
[facility] CONSULT FOLLOW-UP NOTE  [name] [id number] DOB: 1980-11-17 DOA: 08/19/2012 PCP: Mack Hook, MD Requesting physician: Michael Boston, MD Attending service: General Surgery Reason for consultation: diabetes, assistance with medical management  Impression/Recommendations: 1. Sepsis--secondary to below. Blood cultures pending, urine culture as below, intraabdominal fluid culture pending. Remains febrile, tachypneic, and intermittently hypotensive. Continue Zosyn, IVF. Management per surgery. Aggressive IVF. 2. Suspected perforated appendicitis--management deferred to general surgery. Agree with empiric Zosyn. 3. DM type 2 uncontrolled--fair control, no DKA. Continue Lantus, SSI. Hgb A1c 13.0. Reports compliance with Lantus and oral agents. Hold oral agents. 4. UTI-->100k GNR, follow-up culture, continue Zosyn. 5. Normocytic anemia--stable, suspect chronic with possible component of anemia with critical illness. Follow closely, serial CBC, Transfuse <7. 6. HTN--hold lisinopril until condition improves. 7. History of PE/DVT 2010--SCDs pending procedure and surgery evaluation.  8. History of bilateral oopherectomy and small bowel resection in 2010 at [facility]  No improvement in acute issues thus far. DM, anemia, HTN, chronic medical issues appear stable. Left message to discuss condition with Dr. Lucia Gaskins. Will follow.  Code Status: Full code Family Communication: none present Disposition Plan: per surgery  Ann Hodgkins, MD  [facility] Team 6 Pager [phone]. If 8PM-8AM, please contact night-coverage at [email or web address], password [facility] 08/21/2012, 10:09 AM  LOS: 2 days   Brief narrative: 31 year old woman with history of 5 days of fever, flank pain, chills, nausea, vomiting, dysuria. CT ab/pelvis suspicious for ruptured appendix with free air in abdomen. Admitted by surgery and medicine consulted for assistance with DM and medical  issues.  Procedures:    Antibiotics:  Zosyn 10/6 >>  Fluconazole 10/6 >>  HPI/Subjective: Remains febrile, tachypneic, tachycardic. Was hypotensive overnight and intermittently today. Complains of RLQ abdominal pain, no n/v.  Objective: Filed Vitals:   08/21/12 0500 08/21/12 0530 08/21/12 0630 08/21/12 0730  BP: 108/50 99/54 111/51 84/45  Pulse: 116 116 116 116  Temp:    99.8 F (37.7 C)  TempSrc:    Oral  Resp: 31 28 27 29   Height:      Weight:      SpO2: 98% 98% 98% 97%    Intake/Output Summary (Last 24 hours) at 08/21/12 1009 Last data filed at 08/21/12 0741  Gross per 24 hour  Intake   4292 ml  Output    526 ml  Net   3766 ml    Exam:  General:  Appears calm, uncomfortable, ill but not toxic Cardiovascular: RRR, no m/r/g. No LE edema. Respiratory: CTA bilaterally, no w/r/r. Normal respiratory effort. tachypneic. Psychiatric: grossly normal mood and affect, speech fluent and appropriate  Data Reviewed: Basic Metabolic Panel:  Lab 0000000 0317 08/20/12 0332 08/19/12 1735  NA 136 135 131*  K 3.6 3.5 4.0  CL 104 101 91*  CO2 18* 22 22  GLUCOSE 179* 238* 374*  BUN 7 7 11   CREATININE 1.11* 0.92 1.23*  CALCIUM 8.1* 8.0* 9.4  MG -- -- --  PHOS -- -- --   Liver Function Tests:  Lab 08/20/12 0332 08/19/12 1735  AST 8 9  ALT 10 14  ALKPHOS 121* 168*  BILITOT 0.3 0.3  PROT 6.6 8.6*  ALBUMIN 2.3* 2.8*    Lab 08/19/12 1735  LIPASE 19  AMYLASE --   CBC:  Lab 08/21/12 0317 08/20/12 1055 08/20/12 0332 08/19/12 1735  WBC 17.6* 23.5* 20.1* 19.0*  NEUTROABS 14.8* -- 16.6* 16.2*  HGB 8.5* 9.3* 8.5* 11.4*  HCT 24.8* 27.0* 25.0* 33.3*  MCV 83.5 82.8 82.5 82.8  PLT 389 452* 421* 563*   CBG:  Lab 08/21/12 0742 08/21/12 0334 08/21/12 0002 08/20/12 1937 08/20/12 1552  GLUCAP 168* 163* 160* 147* 151*    Recent Results (from the past 240 hour(s))  URINE CULTURE     Status: Normal (Preliminary result)   Collection Time   08/19/12  5:49 PM       Component Value Range Status Comment   Specimen Description URINE, CATHETERIZED   Final    Special Requests NONE   Final    Culture  Setup Time 08/20/2012 12:23   Final    Colony Count >=100,000 COLONIES/ML   Final    Culture GRAM NEGATIVE RODS   Final    Report Status PENDING   Incomplete   CULTURE, BLOOD (ROUTINE X 2)     Status: Normal (Preliminary result)   Collection Time   08/19/12 11:00 PM      Component Value Range Status Comment   Specimen Description BLOOD RIGHT ANTECUBITAL   Final    Special Requests BOTTLES DRAWN AEROBIC ONLY 3CC   Final    Culture  Setup Time 08/20/2012 12:19   Final    Culture     Final    Value:        BLOOD CULTURE RECEIVED NO GROWTH TO DATE CULTURE WILL BE HELD FOR 5 DAYS BEFORE ISSUING A FINAL NEGATIVE REPORT   Report Status PENDING   Incomplete   CULTURE, BLOOD (ROUTINE X 2)     Status: Normal (Preliminary result)   Collection Time   08/19/12 11:15 PM      Component Value Range Status Comment   Specimen Description BLOOD LEFT ANTECUBITAL   Final    Special Requests BOTTLES DRAWN AEROBIC AND ANAEROBIC 5CC   Final    Culture  Setup Time 08/20/2012 12:19   Final    Culture     Final    Value:        BLOOD CULTURE RECEIVED NO GROWTH TO DATE CULTURE WILL BE HELD FOR 5 DAYS BEFORE ISSUING A FINAL NEGATIVE REPORT   Report Status PENDING   Incomplete   MRSA PCR SCREENING     Status: Normal   Collection Time   08/20/12 12:22 AM      Component Value Range Status Comment   MRSA by PCR NEGATIVE  NEGATIVE Final   CULTURE, ROUTINE-ABSCESS     Status: Normal (Preliminary result)   Collection Time   08/20/12 10:24 AM      Component Value Range Status Comment   Specimen Description PERINEAL   Final    Special Requests NONE   Final    Gram Stain     Final    Value: ABUNDANT WBC PRESENT,BOTH PMN AND MONONUCLEAR     NO SQUAMOUS EPITHELIAL CELLS SEEN     MODERATE GRAM POSITIVE COCCI     IN PAIRS MODERATE GRAM NEGATIVE RODS     FEW GRAM POSITIVE RODS   Culture NO  GROWTH   Final    Report Status PENDING   Incomplete   ANAEROBIC CULTURE     Status: Normal (Preliminary result)   Collection Time   08/20/12  2:57 PM      Component Value Range Status Comment   Specimen Description PERINEAL   Final    Special Requests NONE   Final    Gram Stain     Final    Value: ABUNDANT WBC PRESENT,BOTH PMN AND MONONUCLEAR     NO SQUAMOUS EPITHELIAL CELLS  SEEN     MODERATE GRAM POSITIVE COCCI     IN PAIRS MODERATE GRAM NEGATIVE RODS     FEW GRAM POSITIVE RODS   Culture PENDING   Incomplete    Report Status PENDING   Incomplete   CULTURE, ROUTINE-ABSCESS     Status: Normal (Preliminary result)   Collection Time   08/20/12  3:26 PM      Component Value Range Status Comment   Specimen Description PERITONEAL CAVITY   Final    Special Requests NONE   Final    Gram Stain     Final    Value: RARE WBC PRESENT, PREDOMINANTLY PMN     NO SQUAMOUS EPITHELIAL CELLS SEEN     NO ORGANISMS SEEN   Culture NO GROWTH   Final    Report Status PENDING   Incomplete   ANAEROBIC CULTURE     Status: Normal (Preliminary result)   Collection Time   08/20/12  3:26 PM      Component Value Range Status Comment   Specimen Description PERITONEAL CAVITY   Final    Special Requests NONE   Final    Gram Stain     Final    Value: RARE WBC PRESENT, PREDOMINANTLY PMN     NO SQUAMOUS EPITHELIAL CELLS SEEN     NO ORGANISMS SEEN   Culture PENDING   Incomplete    Report Status PENDING   Incomplete      Studies: Ct Abdomen Pelvis W Contrast  08/19/2012  *RADIOLOGY REPORT*  Clinical Data: Lower abdominal pain for 3 days, nausea, vomiting, diarrhea  CT ABDOMEN AND PELVIS WITH CONTRAST  Technique:  Multidetector CT imaging of the abdomen and pelvis was performed following the standard protocol during bolus administration of intravenous contrast.  Contrast: 163mL OMNIPAQUE IOHEXOL 300 MG/ML  SOLN  Comparison: Abdominal CT - 11/02/2010; 02/16/2010  Findings:  Findings compatible with perforated complex  appendicitis with the base of the appendix (coronal images 32 through 40) extending into a irregular multiseptated enhancing fluid collection within the right lower quadrant extending into the anterior pelvis.  Dominant organized collection measuring approximately 8.0 x 4.7 cm in greatest oblique axial dimension (image 68, series 2).  Multiple additional smaller loculated fluid collections extend into the more inferior abdomen and pelvis measuring approximately 11.8 x 7.4 cm in total (image 80, series 2).  These findings are associated with marked adjacent inflammatory change.  There is a minimal amount of extraluminal air within the lower abdomen (image 68).  No definite pneumatosis or portal venous gas.  Stable postsurgical changes of the terminal ileum.  A minimal amount of enteric contrast extends into the mid small bowel.  No definite evidence of enteric obstruction.  There is a minimal amount of fluid within the endometrial canal, favored to be reactive.  No free fluid in the pelvic cul-de-sac.  Normal hepatic contour.  No discrete hepatic lesions.  The gallbladder is under distended otherwise normal.  No definite intra or extrahepatic biliary ductal dilatation.  No ascites.  There is symmetric enhancement the bilateral kidneys.  Mild asymmetric accentuated lobular contour of the right kidney is grossly unchanged, and favored to be the sequela of prior episode of pyelonephritis as demonstrated on remote abdominal CT performed 02/16/2010.  No discrete renal lesions.  No definite renal stones on this postcontrast examination.  No urinary obstruction.  No perinephric stranding.  Normal appearance of the bilateral adrenal glands, pancreas and spleen.  Incidental note is made of a small  splenule.  Limited visualization of the lower thorax is degraded secondary to patient motion artifact but suggests bibasilar atelectasis, left greater than right.  No discrete focal airspace opacities.  No definite pleural effusion.   Normal heart size.  No pericardial effusion.  No acute or aggressive osseous abnormalities.  IMPRESSION:  1. Findings compatible with a complex perforated appendicitis with dominant organized fluid collection measuring approximately 8 x 4.7 cm. There are multiple additional peripheral enhancing fluid collections extending into the right lower abdominal quadrant/pelvis. There is extensive inflammatory change and a small amount of free air within the lower abdomen.  2.  Mild asymmetric lobular contour of the right kidney, the sequela of prior episode of pyelonephritis. No CT evidence of recurrent pyelonephritis or urinary obstruction.  Above findings discussed with Dr. Jeanell Sparrow at 2008.   Original Report Authenticated By: Rachel Moulds, M.D.    Dg Chest Port 1 View  08/19/2012  *RADIOLOGY REPORT*  Clinical Data: 31 year old female shortness of breath.  PORTABLE CHEST - 1 VIEW  Comparison: 02/18/2010 and earlier.  Findings: Portable AP view 2255 hours.  Continued low lung volumes. Allowing for portable technique the lungs are clear.  No pneumothorax.  Cardiac size and mediastinal contours are within normal limits.  Visualized tracheal air column is within normal limits.  IMPRESSION: Continued low lung volumes, otherwise no acute cardiopulmonary abnormality.   Original Report Authenticated By: Randall An, M.D.    Scheduled Meds:    . citalopram  20 mg Oral Daily  . enoxaparin (LOVENOX) injection  40 mg Subcutaneous Daily  . fentaNYL      . fentaNYL      . fluconazole (DIFLUCAN) IV  400 mg Intravenous Q24H  . influenza  inactive virus vaccine  0.5 mL Intramuscular Once  . insulin aspart  0-20 Units Subcutaneous Q4H  . insulin aspart  0-5 Units Subcutaneous QHS  . insulin glargine  15 Units Subcutaneous QHS  . lip balm  1 application Topical BID  . midazolam      . midazolam      . piperacillin-tazobactam (ZOSYN)  IV  3.375 g Intravenous Q8H  . sodium chloride  1,000 mL Intravenous Once  .  DISCONTD: insulin glargine  10 Units Subcutaneous QHS   Continuous Infusions:    . 0.45 % NaCl with KCl 20 mEq / L 150 mL/hr (08/21/12 0110)     Active Problems:  DIABETES MELLITUS, TYPE II, UNCONTROLLED  URINALYSIS, ABNORMAL  Obesity (BMI 30-39.9)  Anxiety  Depression  Noncompliance to therapies / medications  Sepsis  CKD (chronic kidney disease)  UTI (lower urinary tract infection)  Anemia  HTN (hypertension)    Time Spent: 25 minutes  Ann Hodgkins, MD  [facility] Team 6 Pager [phone]. If 8PM-8AM, please contact night-coverage at [email or web address], password [facility] 08/21/2012, 10:09 AM  LOS: 2 days

## 2012-08-21 NOTE — Progress Notes (Signed)
Subjective: Pt having intermittent RLQ cramping discomfort/nausea; no vomiting,+BM  Objective: Vital signs in last 24 hours: Temp:  [99.5 F (37.5 C)-103.7 F (39.8 C)] 99.8 F (37.7 C) (10/08 0730) Pulse Rate:  [114-150] 119  (10/08 1200) Resp:  [8-35] 23  (10/08 1200) BP: (73-119)/(39-67) 93/67 mmHg (10/08 1200) SpO2:  [92 %-100 %] 100 % (10/08 1200) Weight:  [257 lb 11.5 oz (116.9 kg)] 257 lb 11.5 oz (116.9 kg) (10/08 0000) Last BM Date: 08/16/12  Intake/Output from previous day: 10/07 0701 - 10/08 0700 In: 4282 [I.V.:2850; IV Piggyback:1300] Out: 481 [Urine:401; Drains:80] Intake/Output this shift: Total I/O In: 750 [I.V.:750] Out: 55 [Drains:55]  RLQ drains intact, outputs #1- 45 cc's beige colored fluid;  #2- 10 cc's bloody fluid. Both drains flushed with 5 cc's sterile NS- #1 flushes well, #2 is difficult to flush and may be clotted; cx's pend  Lab Results:   Basename 08/21/12 0317 08/20/12 1055  WBC 17.6* 23.5*  HGB 8.5* 9.3*  HCT 24.8* 27.0*  PLT 389 452*   BMET  Basename 08/21/12 0317 08/20/12 0332  NA 136 135  K 3.6 3.5  CL 104 101  CO2 18* 22  GLUCOSE 179* 238*  BUN 7 7  CREATININE 1.11* 0.92  CALCIUM 8.1* 8.0*   PT/INR No results found for this basename: LABPROT:2,INR:2 in the last 72 hours ABG  Basename 08/19/12 1732  PHART --  HCO3 23.8    Studies/Results: Ct Guided Abscess Drain  08/21/2012  *RADIOLOGY REPORT*  CT GUIDED ABSCESS DRAIN X2  Date: 08/20/2012  Clinical History: 31 year old female with right lower quadrant pelvic abscess and clinical sepsis.  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  She has a complex multiloculated fluid collection in the right lower quadrant in the region of the adnexa, and  appendix concerning for ruptured appendicitis and / or tubal ovarian abscess.  Interventional radiology is consulted for percutaneous drain placement as she is a suboptimal surgical candidate given her complicated past history.  Procedures Performed: 1. CT guided abscess drain placement x2  Interventional Radiologist:  Criselda Peaches, MD  Sedation: Moderate (conscious) sedation was used.  Six mg Versed, 400 mcg Fentanyl were administered intravenously.  The patient's vital signs were monitored continuously by radiology nursing throughout the procedure.  Sedation Time: 60 minutes  PROCEDURE/FINDINGS:   Informed consent was obtained from the patient following explanation of the procedure, risks, benefits and alternatives. The patient understands, agrees and consents for the procedure. All questions were addressed. A time out was performed.  Maximal barrier sterile technique utilized including caps, mask, sterile gowns, sterile gloves, large sterile drape, hand hygiene, and betadine skin prep.  A planning axial CT scan was performed.  Both the dominant fluid collection immediately adjacent to the appendix, and more complex multiloculated collection slightly more caudally within the pelvis are identified.  To the skin entry site was selected, warranted to drain the dominant fluid collection, and the second to place a drain in the more multiloculated component.  Both locations were marked with CT fluoroscopic guidance.  Local anesthesia was achieved with infiltration of 1% lidocaine. Under direct CT fluoroscopic guidance, an 18 gauge trocar needle was advanced through the skin, subcutaneous fat, abdominal wall and into the dominant fluid collection.  There was return of frankly purulent material through  the needle.  Therefore, a short 0.035 inches wire was coiled within the dominant fluid collection and the tract serially dilated to 12-French.  A 12 French Cook multipurpose drainage catheter was then advanced over  the wire and positioned within the dominant fluid collection using a combination of CT fluoroscopy and axial CT imaging.  Approximately 200 ml of frankly purulent material was then successfully aspirated. A sample was s sent for Gram stain and culture.  The tube was secured to the skin using both Prolene suture and bumper technique.  Local anesthesia of the second to the entry site was then achieved with infiltration of 1% lidocaine.  Using similar CT fluoroscopic technique, an 18 gauge trocar needle was advanced to the skin, soft tissues, abdominal wall and into the complex multiloculated collection just caudal to the first drain.  Turbid serosanguinous fluid was successfully aspirated through the needle.  Therefore, a short 0.035 inches wire was advanced through the needle and coiled within the collection.  The tract was then serially dilated to 74- [place].  A 12 French Cook multipurpose drainage catheter was then advanced over the wire and coiled within the multiloculated fluid collection using a combination of CT fluoroscopy and axial CT imaging.  Approximately 30 ml of turbid serosanguinous material was successfully aspirated.  The catheter was secured to the skin with O Prolene suture and bumper technique.  The patient tolerated the procedure well, there is no immediate complication.  IMPRESSION:  1.  Successful placement of a 12-French drainage catheter into the dominant fluid collection adjacent to the appendix with aspiration of 200 ml frankly purulent material.  A sample was sent for Gram stain and culture. To the CT imaging findings, and aspiration of frankly purulent material this collection is highly suspicious for ruptured appendicitis.  2.  Successful placement of a second 12-French drainage catheter into the highly loculated fluid collection just caudal to but abutting the dominant fluid collection.  Successful aspiration of approximately 30 ml of turbid serosanguineous fluid.  The fluid in this more  loculated collection is clearly different than the fluid in the adjacent dominant fluid collection.  This second collection may represent a primary, or secondary complex hydrocele or tuboovarian abscess.  Maintaining drains to bulb suction.  Following normalization of clinical signs of sepsis, leukocytosis and minimal drain output, recommend repeat CT scan of the abdomen pelvis with contrast prior to drain removal.  Signed,  Criselda Peaches, MD Vascular & Interventional Radiologist [facility] Radiology   Original Report Authenticated By: Criselda Peaches, M.D.    Ct Guided Abscess Drain  08/21/2012  *RADIOLOGY REPORT*  CT GUIDED ABSCESS DRAIN X2  Date: 08/20/2012  Clinical History: 31 year old female with right lower quadrant pelvic abscess and clinical sepsis.  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  She has a complex multiloculated fluid collection in the right lower quadrant in the region of the adnexa, and appendix concerning for ruptured appendicitis and / or tubal ovarian abscess.  Interventional radiology is consulted for percutaneous drain placement as she is a suboptimal surgical candidate given her complicated past history.  Procedures Performed: 1. CT guided abscess drain placement x2  Interventional Radiologist:  Criselda Peaches, MD  Sedation: Moderate (conscious) sedation was used.  Six mg Versed, 400 mcg Fentanyl were administered intravenously.  The  patient's vital signs were monitored continuously by radiology nursing throughout the procedure.  Sedation Time: 60 minutes  PROCEDURE/FINDINGS:   Informed consent was obtained from the patient following explanation of the procedure, risks, benefits and alternatives. The patient understands, agrees and  consents for the procedure. All questions were addressed. A time out was performed.  Maximal barrier sterile technique utilized including caps, mask, sterile gowns, sterile gloves, large sterile drape, hand hygiene, and betadine skin prep.  A planning axial CT scan was performed.  Both the dominant fluid collection immediately adjacent to the appendix, and more complex multiloculated collection slightly more caudally within the pelvis are identified.  To the skin entry site was selected, warranted to drain the dominant fluid collection, and the second to place a drain in the more multiloculated component.  Both locations were marked with CT fluoroscopic guidance.  Local anesthesia was achieved with infiltration of 1% lidocaine. Under direct CT fluoroscopic guidance, an 18 gauge trocar needle was advanced through the skin, subcutaneous fat, abdominal wall and into the dominant fluid collection.  There was return of frankly purulent material through the needle.  Therefore, a short 0.035 inches wire was coiled within the dominant fluid collection and the tract serially dilated to 12-French.  A 12 French Cook multipurpose drainage catheter was then advanced over the wire and positioned within the dominant fluid collection using a combination of CT fluoroscopy and axial CT imaging.  Approximately 200 ml of frankly purulent material was then successfully aspirated. A sample was s sent for Gram stain and culture.  The tube was secured to the skin using both Prolene suture and bumper technique.  Local anesthesia of the second to the entry site was then achieved with infiltration of 1% lidocaine.  Using similar CT fluoroscopic technique, an 18 gauge trocar needle was advanced to the skin, soft tissues, abdominal wall and into the complex multiloculated collection just caudal to the first drain.  Turbid serosanguinous fluid was successfully aspirated through the needle.  Therefore, a short 0.035 inches wire was advanced  through the needle and coiled within the collection.  The tract was then serially dilated to 31- [place].  A 12 French Cook multipurpose drainage catheter was then advanced over the wire and coiled within the multiloculated fluid collection using a combination of CT fluoroscopy and axial CT imaging.  Approximately 30 ml of turbid serosanguinous material was successfully aspirated.  The catheter was secured to the skin with O Prolene suture and bumper technique.  The patient tolerated the procedure well, there is no immediate complication.  IMPRESSION:  1.  Successful placement of a 12-French drainage catheter into the dominant fluid collection adjacent to the appendix with aspiration of 200 ml frankly purulent material.  A sample was sent for Gram stain and culture. To the CT imaging findings, and aspiration of frankly purulent material this collection is highly suspicious for ruptured appendicitis.  2.  Successful placement of a second 12-French drainage catheter into the highly loculated fluid collection just caudal to but abutting the dominant fluid collection.  Successful aspiration of approximately 30 ml of turbid serosanguineous fluid.  The fluid in this more loculated collection is clearly different than the fluid in the adjacent dominant fluid collection.  This second collection may represent a primary, or secondary complex hydrocele or tuboovarian abscess.  Maintaining drains to bulb suction.  Following normalization of clinical signs of sepsis, leukocytosis and minimal drain output, recommend repeat CT scan of the abdomen pelvis with contrast prior to drain  removal.  Signed,  Criselda Peaches, MD Vascular & Interventional Radiologist [facility] Radiology   Original Report Authenticated By: Criselda Peaches, M.D.    Ct Abdomen Pelvis W Contrast  08/19/2012  *RADIOLOGY REPORT*  Clinical Data: Lower abdominal pain for 3 days, nausea, vomiting, diarrhea  CT ABDOMEN AND PELVIS WITH CONTRAST  Technique:   Multidetector CT imaging of the abdomen and pelvis was performed following the standard protocol during bolus administration of intravenous contrast.  Contrast: 124mL OMNIPAQUE IOHEXOL 300 MG/ML  SOLN  Comparison: Abdominal CT - 11/02/2010; 02/16/2010  Findings:  Findings compatible with perforated complex appendicitis with the base of the appendix (coronal images 32 through 40) extending into a irregular multiseptated enhancing fluid collection within the right lower quadrant extending into the anterior pelvis.  Dominant organized collection measuring approximately 8.0 x 4.7 cm in greatest oblique axial dimension (image 68, series 2).  Multiple additional smaller loculated fluid collections extend into the more inferior abdomen and pelvis measuring approximately 11.8 x 7.4 cm in total (image 80, series 2).  These findings are associated with marked adjacent inflammatory change.  There is a minimal amount of extraluminal air within the lower abdomen (image 68).  No definite pneumatosis or portal venous gas.  Stable postsurgical changes of the terminal ileum.  A minimal amount of enteric contrast extends into the mid small bowel.  No definite evidence of enteric obstruction.  There is a minimal amount of fluid within the endometrial canal, favored to be reactive.  No free fluid in the pelvic cul-de-sac.  Normal hepatic contour.  No discrete hepatic lesions.  The gallbladder is under distended otherwise normal.  No definite intra or extrahepatic biliary ductal dilatation.  No ascites.  There is symmetric enhancement the bilateral kidneys.  Mild asymmetric accentuated lobular contour of the right kidney is grossly unchanged, and favored to be the sequela of prior episode of pyelonephritis as demonstrated on remote abdominal CT performed 02/16/2010.  No discrete renal lesions.  No definite renal stones on this postcontrast examination.  No urinary obstruction.  No perinephric stranding.  Normal appearance of the  bilateral adrenal glands, pancreas and spleen.  Incidental note is made of a small splenule.  Limited visualization of the lower thorax is degraded secondary to patient motion artifact but suggests bibasilar atelectasis, left greater than right.  No discrete focal airspace opacities.  No definite pleural effusion.  Normal heart size.  No pericardial effusion.  No acute or aggressive osseous abnormalities.  IMPRESSION:  1. Findings compatible with a complex perforated appendicitis with dominant organized fluid collection measuring approximately 8 x 4.7 cm. There are multiple additional peripheral enhancing fluid collections extending into the right lower abdominal quadrant/pelvis. There is extensive inflammatory change and a small amount of free air within the lower abdomen.  2.  Mild asymmetric lobular contour of the right kidney, the sequela of prior episode of pyelonephritis. No CT evidence of recurrent pyelonephritis or urinary obstruction.  Above findings discussed with Dr. Jeanell Sparrow at 2008.   Original Report Authenticated By: Rachel Moulds, M.D.    Dg Chest Port 1 View  08/19/2012  *RADIOLOGY REPORT*  Clinical Data: 31 year old female shortness of breath.  PORTABLE CHEST - 1 VIEW  Comparison: 02/18/2010 and earlier.  Findings: Portable AP view 2255 hours.  Continued low lung volumes. Allowing for portable technique the lungs are clear.  No pneumothorax.  Cardiac size and mediastinal contours are within normal limits.  Visualized tracheal air column is within normal limits.  IMPRESSION: Continued low lung volumes, otherwise no acute cardiopulmonary abnormality.   Original Report Authenticated By: Randall An, M.D.     Anti-infectives: Anti-infectives     Start     Dose/Rate Route Frequency Ordered Stop   08/20/12 0500   piperacillin-tazobactam (ZOSYN) IVPB 3.375 g        3.375 g 12.5 mL/hr over 240 Minutes Intravenous Every 8 hours 08/19/12 2210     08/19/12 2300   fluconazole (DIFLUCAN) IVPB 400 mg         400 mg 200 mL/hr over 60 Minutes Intravenous Every 24 hours 08/19/12 2145     08/19/12 2100   piperacillin-tazobactam (ZOSYN) IVPB 3.375 g        3.375 g 12.5 mL/hr over 240 Minutes Intravenous  Once 08/19/12 2014 08/20/12 0048   08/19/12 2000   cefTRIAXone (ROCEPHIN) 2 g in dextrose 5 % 50 mL IVPB        2 g 100 mL/hr over 30 Minutes Intravenous To Emergency Dept 08/19/12 1915 08/19/12 2035          Assessment/Plan: S/p RLQ fluid coll/abscess drainages (x2) 10/7; check final cx's; check f/u CT (possibly injection of drain #1) later this week esp if outputs cont to diminish  LOS: 2 days    ALLRED,D [facility] 08/21/2012

## 2012-08-21 NOTE — Progress Notes (Signed)
Patient ID: [name], female   DOB: 10/27/1981, 31 y.o.   MRN: [id number]    Subjective: Pt feels about the same today.  No significant nausea, just had a BM.  Objective: Vital signs in last 24 hours: Temp:  [99.5 F (37.5 C)-103.7 F (39.8 C)] 99.8 F (37.7 C) (10/08 0730) Pulse Rate:  [114-150] 116  (10/08 1100) Resp:  [8-35] 24  (10/08 1100) BP: (73-119)/(39-66) 108/59 mmHg (10/08 1100) SpO2:  [92 %-100 %] 99 % (10/08 1100) Weight:  [257 lb 11.5 oz (116.9 kg)] 257 lb 11.5 oz (116.9 kg) (10/08 0000) Last BM Date: 08/16/12  Intake/Output from previous day: 10/07 0701 - 10/08 0700 In: 4282 [I.V.:2850; IV Piggyback:1300] Out: 83 [Urine:401; Drains:80] Intake/Output this shift: Total I/O In: 10 [Other:10] Out: 45 [Drains:45]  PE: Abd: soft, very tender on right side of abdomen, JP#1 with light tan, thin, purulent appearing drainage, JP#2 with more clear serous output. Absent BS, obese Ht: tachy but regular Lungs: CTAB  Lab Results:   Basename 08/21/12 0317 08/20/12 1055  WBC 17.6* 23.5*  HGB 8.5* 9.3*  HCT 24.8* 27.0*  PLT 389 452*   BMET  Basename 08/21/12 0317 08/20/12 0332  NA 136 135  K 3.6 3.5  CL 104 101  CO2 18* 22  GLUCOSE 179* 238*  BUN 7 7  CREATININE 1.11* 0.92  CALCIUM 8.1* 8.0*   PT/INR No results found for this basename: LABPROT:2,INR:2 in the last 72 hours CMP     Component Value Date/Time   NA 136 08/21/2012 0317   K 3.6 08/21/2012 0317   CL 104 08/21/2012 0317   CO2 18* 08/21/2012 0317   GLUCOSE 179* 08/21/2012 0317   BUN 7 08/21/2012 0317   CREATININE 1.11* 08/21/2012 0317   CALCIUM 8.1* 08/21/2012 0317   PROT 6.6 08/20/2012 0332   ALBUMIN 2.3* 08/20/2012 0332   AST 8 08/20/2012 0332   ALT 10 08/20/2012 0332   ALKPHOS 121* 08/20/2012 0332   BILITOT 0.3 08/20/2012 0332   GFRNONAA 65* 08/21/2012 0317   GFRAA 76* 08/21/2012 0317   Lipase     Component Value Date/Time   LIPASE 19 08/19/2012 1735       Studies/Results: Ct Guided  Abscess Drain  08/21/2012  *RADIOLOGY REPORT*  CT GUIDED ABSCESS DRAIN X2  Date: 08/20/2012  Clinical History: 30 year old female with right lower quadrant pelvic abscess and clinical sepsis.  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  She has a complex multiloculated fluid collection in the right lower quadrant in the region of the adnexa, and appendix concerning for ruptured appendicitis and / or tubal ovarian abscess.  Interventional radiology is consulted for percutaneous drain placement as she is a suboptimal surgical candidate given her complicated past history.  Procedures Performed: 1. CT guided abscess drain placement x2  Interventional Radiologist:  Criselda Peaches, MD  Sedation: Moderate (conscious) sedation was used.  Six mg Versed, 400 mcg Fentanyl were administered intravenously.  The patient's vital signs were monitored continuously by radiology nursing throughout the procedure.  Sedation Time: 60 minutes  PROCEDURE/FINDINGS:   Informed consent was obtained from the patient following explanation of the procedure, risks, benefits and alternatives. The patient understands, agrees and consents for the procedure. All questions were addressed. A time out was performed.  Maximal barrier  sterile technique utilized including caps, mask, sterile gowns, sterile gloves, large sterile drape, hand hygiene, and betadine skin prep.  A planning axial CT scan was performed.  Both the dominant fluid collection immediately adjacent to the appendix, and more complex multiloculated collection slightly more caudally within the pelvis are identified.  To the skin entry site was selected, warranted to drain the dominant fluid collection, and the second to place a drain in the more  multiloculated component.  Both locations were marked with CT fluoroscopic guidance.  Local anesthesia was achieved with infiltration of 1% lidocaine. Under direct CT fluoroscopic guidance, an 18 gauge trocar needle was advanced through the skin, subcutaneous fat, abdominal wall and into the dominant fluid collection.  There was return of frankly purulent material through the needle.  Therefore, a short 0.035 inches wire was coiled within the dominant fluid collection and the tract serially dilated to 12-French.  A 12 French Cook multipurpose drainage catheter was then advanced over the wire and positioned within the dominant fluid collection using a combination of CT fluoroscopy and axial CT imaging.  Approximately 200 ml of frankly purulent material was then successfully aspirated. A sample was s sent for Gram stain and culture.  The tube was secured to the skin using both Prolene suture and bumper technique.  Local anesthesia of the second to the entry site was then achieved with infiltration of 1% lidocaine.  Using similar CT fluoroscopic technique, an 18 gauge trocar needle was advanced to the skin, soft tissues, abdominal wall and into the complex multiloculated collection just caudal to the first drain.  Turbid serosanguinous fluid was successfully aspirated through the needle.  Therefore, a short 0.035 inches wire was advanced through the needle and coiled within the collection.  The tract was then serially dilated to 71- [place].  A 12 French Cook multipurpose drainage catheter was then advanced over the wire and coiled within the multiloculated fluid collection using a combination of CT fluoroscopy and axial CT imaging.  Approximately 30 ml of turbid serosanguinous material was successfully aspirated.  The catheter was secured to the skin with O Prolene suture and bumper technique.  The patient tolerated the procedure well, there is no immediate complication.  IMPRESSION:  1.  Successful placement of a  12-French drainage catheter into the dominant fluid collection adjacent to the appendix with aspiration of 200 ml frankly purulent material.  A sample was sent for Gram stain and culture. To the CT imaging findings, and aspiration of frankly purulent material this collection is highly suspicious for ruptured appendicitis.  2.  Successful placement of a second 12-French drainage catheter into the highly loculated fluid collection just caudal to but abutting the dominant fluid collection.  Successful aspiration of approximately 30 ml of turbid serosanguineous fluid.  The fluid in this more loculated collection is clearly different than the fluid in the adjacent dominant fluid collection.  This second collection may represent a primary, or secondary complex hydrocele or tuboovarian abscess.  Maintaining drains to bulb suction.  Following normalization of clinical signs of sepsis, leukocytosis and minimal drain output, recommend repeat CT scan of the abdomen pelvis with contrast prior to drain removal.  Signed,  Criselda Peaches, MD Vascular & Interventional Radiologist [facility] Radiology   Original Report Authenticated By: Criselda Peaches, M.D.    Ct Guided Abscess Drain  08/21/2012  *RADIOLOGY REPORT*  CT GUIDED ABSCESS DRAIN X2  Date: 08/20/2012  Clinical History: 31 year old female with right lower quadrant pelvic abscess and clinical  sepsis.  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  She has a complex multiloculated fluid collection in the right lower quadrant in the region of the adnexa, and appendix concerning for ruptured appendicitis and / or tubal ovarian abscess.  Interventional radiology is consulted for percutaneous drain placement as she is a suboptimal surgical  candidate given her complicated past history.  Procedures Performed: 1. CT guided abscess drain placement x2  Interventional Radiologist:  Criselda Peaches, MD  Sedation: Moderate (conscious) sedation was used.  Six mg Versed, 400 mcg Fentanyl were administered intravenously.  The patient's vital signs were monitored continuously by radiology nursing throughout the procedure.  Sedation Time: 60 minutes  PROCEDURE/FINDINGS:   Informed consent was obtained from the patient following explanation of the procedure, risks, benefits and alternatives. The patient understands, agrees and consents for the procedure. All questions were addressed. A time out was performed.  Maximal barrier sterile technique utilized including caps, mask, sterile gowns, sterile gloves, large sterile drape, hand hygiene, and betadine skin prep.  A planning axial CT scan was performed.  Both the dominant fluid collection immediately adjacent to the appendix, and more complex multiloculated collection slightly more caudally within the pelvis are identified.  To the skin entry site was selected, warranted to drain the dominant fluid collection, and the second to place a drain in the more multiloculated component.  Both locations were marked with CT fluoroscopic guidance.  Local anesthesia was achieved with infiltration of 1% lidocaine. Under direct CT fluoroscopic guidance, an 18 gauge trocar needle was advanced through the skin, subcutaneous fat, abdominal wall and into the dominant fluid collection.  There was return of frankly purulent material through the needle.  Therefore, a short 0.035 inches wire was coiled within the dominant fluid collection and the tract serially dilated to 12-French.  A 12 French Cook multipurpose drainage catheter was then advanced over the wire and positioned within the dominant fluid collection using a combination of CT fluoroscopy and axial CT imaging.  Approximately 200 ml of frankly purulent material was then  successfully aspirated. A sample was s sent for Gram stain and culture.  The tube was secured to the skin using both Prolene suture and bumper technique.  Local anesthesia of the second to the entry site was then achieved with infiltration of 1% lidocaine.  Using similar CT fluoroscopic technique, an 18 gauge trocar needle was advanced to the skin, soft tissues, abdominal wall and into the complex multiloculated collection just caudal to the first drain.  Turbid serosanguinous fluid was successfully aspirated through the needle.  Therefore, a short 0.035 inches wire was advanced through the needle and coiled within the collection.  The tract was then serially dilated to 54- [place].  A 12 French Cook multipurpose drainage catheter was then advanced over the wire and coiled within the multiloculated fluid collection using a combination of CT fluoroscopy and axial CT imaging.  Approximately 30 ml of turbid serosanguinous material was successfully aspirated.  The catheter was secured to the skin with O Prolene suture and bumper technique.  The patient tolerated the procedure well, there is no immediate complication.  IMPRESSION:  1.  Successful placement of a 12-French drainage catheter into the dominant fluid collection adjacent to the appendix with aspiration of 200 ml frankly purulent material.  A  sample was sent for Gram stain and culture. To the CT imaging findings, and aspiration of frankly purulent material this collection is highly suspicious for ruptured appendicitis.  2.  Successful placement of a second 12-French drainage catheter into the highly loculated fluid collection just caudal to but abutting the dominant fluid collection.  Successful aspiration of approximately 30 ml of turbid serosanguineous fluid.  The fluid in this more loculated collection is clearly different than the fluid in the adjacent dominant fluid collection.  This second collection may represent a primary, or secondary complex hydrocele  or tuboovarian abscess.  Maintaining drains to bulb suction.  Following normalization of clinical signs of sepsis, leukocytosis and minimal drain output, recommend repeat CT scan of the abdomen pelvis with contrast prior to drain removal.  Signed,  Criselda Peaches, MD Vascular & Interventional Radiologist [facility] Radiology   Original Report Authenticated By: Criselda Peaches, M.D.    Ct Abdomen Pelvis W Contrast  08/19/2012  *RADIOLOGY REPORT*  Clinical Data: Lower abdominal pain for 3 days, nausea, vomiting, diarrhea  CT ABDOMEN AND PELVIS WITH CONTRAST  Technique:  Multidetector CT imaging of the abdomen and pelvis was performed following the standard protocol during bolus administration of intravenous contrast.  Contrast: 181mL OMNIPAQUE IOHEXOL 300 MG/ML  SOLN  Comparison: Abdominal CT - 11/02/2010; 02/16/2010  Findings:  Findings compatible with perforated complex appendicitis with the base of the appendix (coronal images 32 through 40) extending into a irregular multiseptated enhancing fluid collection within the right lower quadrant extending into the anterior pelvis.  Dominant organized collection measuring approximately 8.0 x 4.7 cm in greatest oblique axial dimension (image 68, series 2).  Multiple additional smaller loculated fluid collections extend into the more inferior abdomen and pelvis measuring approximately 11.8 x 7.4 cm in total (image 80, series 2).  These findings are associated with marked adjacent inflammatory change.  There is a minimal amount of extraluminal air within the lower abdomen (image 68).  No definite pneumatosis or portal venous gas.  Stable postsurgical changes of the terminal ileum.  A minimal amount of enteric contrast extends into the mid small bowel.  No definite evidence of enteric obstruction.  There is a minimal amount of fluid within the endometrial canal, favored to be reactive.  No free fluid in the pelvic cul-de-sac.  Normal hepatic contour.  No discrete  hepatic lesions.  The gallbladder is under distended otherwise normal.  No definite intra or extrahepatic biliary ductal dilatation.  No ascites.  There is symmetric enhancement the bilateral kidneys.  Mild asymmetric accentuated lobular contour of the right kidney is grossly unchanged, and favored to be the sequela of prior episode of pyelonephritis as demonstrated on remote abdominal CT performed 02/16/2010.  No discrete renal lesions.  No definite renal stones on this postcontrast examination.  No urinary obstruction.  No perinephric stranding.  Normal appearance of the bilateral adrenal glands, pancreas and spleen.  Incidental note is made of a small splenule.  Limited visualization of the lower thorax is degraded secondary to patient motion artifact but suggests bibasilar atelectasis, left greater than right.  No discrete focal airspace opacities.  No definite pleural effusion.  Normal heart size.  No pericardial effusion.  No acute or aggressive osseous abnormalities.  IMPRESSION:  1. Findings compatible with a complex perforated appendicitis with dominant organized fluid collection measuring approximately 8 x 4.7 cm. There are multiple additional peripheral enhancing fluid collections extending into the right lower abdominal quadrant/pelvis. There is extensive inflammatory change and a  small amount of free air within the lower abdomen.  2.  Mild asymmetric lobular contour of the right kidney, the sequela of prior episode of pyelonephritis. No CT evidence of recurrent pyelonephritis or urinary obstruction.  Above findings discussed with Dr. Jeanell Sparrow at 2008.   Original Report Authenticated By: Rachel Moulds, M.D.    Dg Chest Port 1 View  08/19/2012  *RADIOLOGY REPORT*  Clinical Data: 31 year old female shortness of breath.  PORTABLE CHEST - 1 VIEW  Comparison: 02/18/2010 and earlier.  Findings: Portable AP view 2255 hours.  Continued low lung volumes. Allowing for portable technique the lungs are clear.  No  pneumothorax.  Cardiac size and mediastinal contours are within normal limits.  Visualized tracheal air column is within normal limits.  IMPRESSION: Continued low lung volumes, otherwise no acute cardiopulmonary abnormality.   Original Report Authenticated By: Randall An, M.D.     Anti-infectives: Anti-infectives     Start     Dose/Rate Route Frequency Ordered Stop   08/20/12 0500  piperacillin-tazobactam (ZOSYN) IVPB 3.375 g       3.375 g 12.5 mL/hr over 240 Minutes Intravenous Every 8 hours 08/19/12 2210     08/19/12 2300   fluconazole (DIFLUCAN) IVPB 400 mg        400 mg 200 mL/hr over 60 Minutes Intravenous Every 24 hours 08/19/12 2145     08/19/12 2100  piperacillin-tazobactam (ZOSYN) IVPB 3.375 g       3.375 g 12.5 mL/hr over 240 Minutes Intravenous  Once 08/19/12 2014 08/20/12 0048   08/19/12 2000   cefTRIAXone (ROCEPHIN) 2 g in dextrose 5 % 50 mL IVPB        2 g 100 mL/hr over 30 Minutes Intravenous To Emergency Dept 08/19/12 1915 08/19/12 2035           Assessment/Plan  1. Intra-abdominal fluid collections, s/p perc drain, etiology unclear, possible ruptured appendicitis vs recurrent pelvic abscess secondary to GYN procedure 1-2 years ago.  Perc drain - 10/7 - one drain is purulent and one drain is serous. 2. Sepsis 3. DM  HgbA1c - 13.0 - 08/20/2012 4. Depression/anxiety 5. Noncompliance 6. CKD 7. HTN 8.  Was followed by [facility] - Dr. Casilda Carls   Now without PCP 9. Obesity (BMI 30-39.9)  10. DVT proph -   History of DVT and filter   Plan: 1. Cont IV abx therapy.  WBC is trending downwards, but patient's BP is still soft and she is tachycardic still. 2. Cont JP drains 3. Patient's records really need to be obtained from [facility] so we know what type of surgical procedures she had over the last 1-2 years.  Will write some orders to get these records. 4. Appreciate medicine's assistance with this patient and her medical issues.   LOS: 2 days    OSBORNE,KELLY  E 08/21/2012, 12:09 PM Pager: [phone]  Trying to get records from [facility], but nothing has been done.  Still more tender on the right side than I would like, but WBC is trending down.  Alphonsa Overall, MD, [facility] Pager: [phone] Office phone:  [phone]

## 2012-08-22 DIAGNOSIS — F411 Generalized anxiety disorder: Secondary | ICD-10-CM

## 2012-08-22 DIAGNOSIS — Z9119 Patient's noncompliance with other medical treatment and regimen: Secondary | ICD-10-CM

## 2012-08-22 DIAGNOSIS — Z91199 Patient's noncompliance with other medical treatment and regimen due to unspecified reason: Secondary | ICD-10-CM

## 2012-08-22 DIAGNOSIS — K3533 Acute appendicitis with perforation and localized peritonitis, with abscess: Secondary | ICD-10-CM

## 2012-08-22 DIAGNOSIS — A419 Sepsis, unspecified organism: Secondary | ICD-10-CM

## 2012-08-22 LAB — URINE CULTURE: Colony Count: >=100000

## 2012-08-22 LAB — CBC
HCT: 24.9 % — ABNORMAL LOW (ref 36.0–46.0)
MCH: 28.2 pg (ref 26.0–34.0)
MCV: 82.7 fL (ref 78.0–100.0)
Platelets: 410 10*3/uL — ABNORMAL HIGH (ref 150–400)
RDW: 13.4 % (ref 11.5–15.5)
WBC: 17.7 10*3/uL — ABNORMAL HIGH (ref 4.0–10.5)

## 2012-08-22 LAB — GLUCOSE, CAPILLARY
Glucose-Capillary: 119 mg/dL — ABNORMAL HIGH (ref 70–99)
Glucose-Capillary: 129 mg/dL — ABNORMAL HIGH (ref 70–99)
Glucose-Capillary: 174 mg/dL — ABNORMAL HIGH (ref 70–99)
Glucose-Capillary: 97 mg/dL (ref 70–99)

## 2012-08-22 LAB — BASIC METABOLIC PANEL
BUN: 5 mg/dL — ABNORMAL LOW (ref 6–23)
Calcium: 8.4 mg/dL (ref 8.4–10.5)
Chloride: 105 mEq/L (ref 96–112)
Creatinine, Ser: 0.94 mg/dL (ref 0.50–1.10)
GFR calc Af Amer: >90 mL/min (ref >90)

## 2012-08-22 LAB — CULTURE, ROUTINE-ABSCESS

## 2012-08-22 NOTE — Progress Notes (Signed)
RN received notification from NT that it appeared as if pt had stool coming from her vagina. NT reported that no stool was present in or around rectal area. Follow up r/t to this issue is needed.

## 2012-08-22 NOTE — Progress Notes (Signed)
ANTIBIOTIC CONSULT NOTE - Follow up  Pharmacy Consult for Zosyn Indication: abdominal abscess, UTI  No Known Allergies  Patient Measurements: Height: 5\' 8"  (172.7 cm) Weight: 260 lb 2.3 oz (118 kg) IBW/kg (Calculated) : 63.9    Vital Signs: Temp: 100.1 F (37.8 C) (10/09 0800) Temp src: Oral (10/09 0800) BP: 110/68 mmHg (10/09 1100) Pulse Rate: 109  (10/09 1100) Intake/Output from previous day: 10/08 0701 - 10/09 0700 In: 3925 [I.V.:3600; IV Piggyback:325] Out: 135 [Drains:135]  Labs:  [facility] 08/22/12 0320 08/21/12 0317 08/20/12 1055 08/20/12 0332  WBC 17.7* 17.6* 23.5* --  HGB 8.5* 8.5* 9.3* --  PLT 410* 389 452* --  LABCREA -- -- -- --  CREATININE 0.94 1.11* -- 0.92   Estimated Creatinine Clearance: 117 ml/min (by C-G formula based on Cr of 0.94). No results found for this basename: VANCOTROUGH:2,VANCOPEAK:2,VANCORANDOM:2,GENTTROUGH:2,GENTPEAK:2,GENTRANDOM:2,TOBRATROUGH:2,TOBRAPEAK:2,TOBRARND:2,AMIKACINPEAK:2,AMIKACINTROU:2,AMIKACIN:2, in the last 72 hours   Microbiology: Recent Results (from the past 720 hour(s))  URINE CULTURE     Status: Normal   Collection Time   08/19/12  5:49 PM      Component Value Range Status Comment   Specimen Description URINE, CATHETERIZED   Final    Special Requests NONE   Final    Culture  Setup Time 08/20/2012 12:23   Final    Colony Count >=100,000 COLONIES/ML   Final    Culture KLEBSIELLA PNEUMONIAE   Final    Report Status 08/22/2012 FINAL   Final    Organism ID, Bacteria KLEBSIELLA PNEUMONIAE   Final   CULTURE, BLOOD (ROUTINE X 2)     Status: Normal (Preliminary result)   Collection Time   08/19/12 11:00 PM      Component Value Range Status Comment   Specimen Description BLOOD RIGHT ANTECUBITAL   Final    Special Requests BOTTLES DRAWN AEROBIC ONLY 3CC   Final    Culture  Setup Time 08/20/2012 12:19   Final    Culture     Final    Value:        BLOOD CULTURE RECEIVED NO GROWTH TO DATE CULTURE WILL BE HELD FOR 5 DAYS BEFORE  ISSUING A FINAL NEGATIVE REPORT   Report Status PENDING   Incomplete   CULTURE, BLOOD (ROUTINE X 2)     Status: Normal (Preliminary result)   Collection Time   08/19/12 11:15 PM      Component Value Range Status Comment   Specimen Description BLOOD LEFT ANTECUBITAL   Final    Special Requests BOTTLES DRAWN AEROBIC AND ANAEROBIC 5CC   Final    Culture  Setup Time 08/20/2012 12:19   Final    Culture     Final    Value:        BLOOD CULTURE RECEIVED NO GROWTH TO DATE CULTURE WILL BE HELD FOR 5 DAYS BEFORE ISSUING A FINAL NEGATIVE REPORT   Report Status PENDING   Incomplete   MRSA PCR SCREENING     Status: Normal   Collection Time   08/20/12 12:22 AM      Component Value Range Status Comment   MRSA by PCR NEGATIVE  NEGATIVE Final   CULTURE, ROUTINE-ABSCESS     Status: Normal (Preliminary result)   Collection Time   08/20/12 10:24 AM      Component Value Range Status Comment   Specimen Description PERINEAL   Final    Special Requests NONE   Final    Gram Stain     Final    Value: ABUNDANT WBC  PRESENT,BOTH PMN AND MONONUCLEAR     NO SQUAMOUS EPITHELIAL CELLS SEEN     MODERATE GRAM POSITIVE COCCI     IN PAIRS MODERATE GRAM NEGATIVE RODS     FEW GRAM POSITIVE RODS   Culture NO GROWTH   Final    Report Status PENDING   Incomplete   ANAEROBIC CULTURE     Status: Normal (Preliminary result)   Collection Time   08/20/12  2:57 PM      Component Value Range Status Comment   Specimen Description PERINEAL   Final    Special Requests NONE   Final    Gram Stain     Final    Value: ABUNDANT WBC PRESENT,BOTH PMN AND MONONUCLEAR     NO SQUAMOUS EPITHELIAL CELLS SEEN     MODERATE GRAM POSITIVE COCCI     IN PAIRS MODERATE GRAM NEGATIVE RODS     FEW GRAM POSITIVE RODS   Culture     Final    Value: NO ANAEROBES ISOLATED; CULTURE IN PROGRESS FOR 5 DAYS   Report Status PENDING   Incomplete   CULTURE, ROUTINE-ABSCESS     Status: Normal (Preliminary result)   Collection Time   08/20/12  3:26 PM       Component Value Range Status Comment   Specimen Description PERITONEAL CAVITY   Final    Special Requests NONE   Final    Gram Stain     Final    Value: RARE WBC PRESENT, PREDOMINANTLY PMN     NO SQUAMOUS EPITHELIAL CELLS SEEN     NO ORGANISMS SEEN   Culture NO GROWTH 1 DAY   Final    Report Status PENDING   Incomplete   ANAEROBIC CULTURE     Status: Normal (Preliminary result)   Collection Time   08/20/12  3:26 PM      Component Value Range Status Comment   Specimen Description PERITONEAL CAVITY   Final    Special Requests NONE   Final    Gram Stain     Final    Value: RARE WBC PRESENT, PREDOMINANTLY PMN     NO SQUAMOUS EPITHELIAL CELLS SEEN     NO ORGANISMS SEEN   Culture     Final    Value: NO ANAEROBES ISOLATED; CULTURE IN PROGRESS FOR 5 DAYS   Report Status PENDING   Incomplete      Medications:  Anti-infectives     Start     Dose/Rate Route Frequency Ordered Stop   08/20/12 0500  piperacillin-tazobactam (ZOSYN) IVPB 3.375 g       3.375 g 12.5 mL/hr over 240 Minutes Intravenous Every 8 hours 08/19/12 2210     08/19/12 2300   fluconazole (DIFLUCAN) IVPB 400 mg        400 mg 200 mL/hr over 60 Minutes Intravenous Every 24 hours 08/19/12 2145     08/19/12 2100  piperacillin-tazobactam (ZOSYN) IVPB 3.375 g       3.375 g 12.5 mL/hr over 240 Minutes Intravenous  Once 08/19/12 2014 08/20/12 0048   08/19/12 2000   cefTRIAXone (ROCEPHIN) 2 g in dextrose 5 % 50 mL IVPB        2 g 100 mL/hr over 30 Minutes Intravenous To Emergency Dept 08/19/12 1915 08/19/12 2035          Assessment:  77 YOF admit 10/6 with abdominal abscess, UTI.  D#4 Zosyn EI and fluconazole  Renal function stable, CrCl (n) ~ 100 ml/min  Peritoneal culture  pending.  Urine culture = Klebsiella (Sens to cefazolin, ceftriaxone, cipro, gent, levo, zosyn, bactrim)  Plan:   Continue Zosyn 3.375g IV Q8H infused over 4hrs.  Follow up renal fxn and culture results.   Gretta Arab PharmD,  BCPS Pager [phone] 08/22/2012 1:49 PM

## 2012-08-22 NOTE — Progress Notes (Signed)
General Surgery Note  LOS: 3 days  Room - 1234  Assessment/Plan: 1.  Multiloculated RLQ absces  Perc drain x 2 - 08/20/2012  Source unclear - appendix vs tubular vs ? - gram stain multi organism - gram positive cocci, gram neg and pos rods  WBC - 17,700 (unchanged) - 08/22/2012  On Diflucan/Zosyn  2.  DIABETES MELLITUS, TYPE II  HgbA1c - 13.0 - 08/20/2012  BS - 119 - 08/22/2012 3.  UTI (lower urinary tract infection)   >100,000 - Klebsiella pneumoniae 4.  Obesity (BMI 30-39.9)  5.  Anxiety /Depression   6.  CKD (chronic kidney disease)   Creat - 0.94 - 08/22/2012 7.  Anemia   Hgb - 8.5 - 08/22/2012 8. HTN (hypertension)  9.  DVT prophy  History of DVT and filter  On Lovenox  Subjective:  Somewhat doing better.  Having bowel movements. Still tender right abdomen, but a little better.   Awaiting for records from [facility].  Apparently they were requested yesterday and we still have not heard back.  So they were re-requested. Objective:   Filed Vitals:   08/22/12 1000  BP: 119/72  Pulse: 109  Temp:   Resp: 20     Intake/Output from previous day:  10/08 0701 - 10/09 0700 In: 3925 [I.V.:3600; IV Piggyback:325] Out: 135 [Drains:135]  Intake/Output this shift:  Total I/O In: 310 [I.V.:300; Other:10] Out: 20 [Drains:20]   Physical Exam:   General: [name] who is alert and oriented.    HEENT: Normal. Pupils equal. .   Lungs: Clear   Abdomen: Tender right abdomen,  BS present.  Well healed lower abdominal incision.     Lab Results:    Basename 08/22/12 0320 08/21/12 0317  WBC 17.7* 17.6*  HGB 8.5* 8.5*  HCT 24.9* 24.8*  PLT 410* 389    BMET   Basename 08/22/12 0320 08/21/12 0317  NA 137 136  K 3.8 3.6  CL 105 104  CO2 18* 18*  GLUCOSE 118* 179*  BUN 5* 7  CREATININE 0.94 1.11*  CALCIUM 8.4 8.1*    PT/INR  No results found for this basename: LABPROT:2,INR:2 in the last 72 hours  ABG   Basename 08/19/12 1732  PHART --  HCO3 23.8      Studies/Results:  Ct Guided Abscess Drain  08/21/2012  *RADIOLOGY REPORT*  CT GUIDED ABSCESS DRAIN X2  Date: 08/20/2012  Clinical History: 31 year old female with right lower quadrant pelvic abscess and clinical sepsis.  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  She has a complex multiloculated fluid collection in the right lower quadrant in the region of the adnexa, and appendix concerning for ruptured appendicitis and / or tubal ovarian abscess.  Interventional radiology is consulted for percutaneous drain placement as she is a suboptimal surgical candidate given her complicated past history.  Procedures Performed: 1. CT guided abscess drain placement x2  Interventional Radiologist:  Criselda Peaches, MD  Sedation: Moderate (conscious) sedation was used.  Six mg Versed, 400 mcg Fentanyl were administered intravenously.  The patient's vital signs were monitored continuously by radiology nursing throughout the procedure.  Sedation Time: 60 minutes  PROCEDURE/FINDINGS:   Informed consent was obtained from the patient following explanation of the procedure, risks, benefits and alternatives. The patient understands, agrees and consents  for the procedure. All questions were addressed. A time out was performed.  Maximal barrier sterile technique utilized including caps, mask, sterile gowns, sterile gloves, large sterile drape, hand hygiene, and betadine skin prep.  A planning axial CT scan was performed.  Both the dominant fluid collection immediately adjacent to the appendix, and more complex multiloculated collection slightly more caudally within the pelvis are identified.  To the skin entry site was selected, warranted to drain the dominant fluid collection, and the second  to place a drain in the more multiloculated component.  Both locations were marked with CT fluoroscopic guidance.  Local anesthesia was achieved with infiltration of 1% lidocaine. Under direct CT fluoroscopic guidance, an 18 gauge trocar needle was advanced through the skin, subcutaneous fat, abdominal wall and into the dominant fluid collection.  There was return of frankly purulent material through the needle.  Therefore, a short 0.035 inches wire was coiled within the dominant fluid collection and the tract serially dilated to 12-French.  A 12 French Cook multipurpose drainage catheter was then advanced over the wire and positioned within the dominant fluid collection using a combination of CT fluoroscopy and axial CT imaging.  Approximately 200 ml of frankly purulent material was then successfully aspirated. A sample was s sent for Gram stain and culture.  The tube was secured to the skin using both Prolene suture and bumper technique.  Local anesthesia of the second to the entry site was then achieved with infiltration of 1% lidocaine.  Using similar CT fluoroscopic technique, an 18 gauge trocar needle was advanced to the skin, soft tissues, abdominal wall and into the complex multiloculated collection just caudal to the first drain.  Turbid serosanguinous fluid was successfully aspirated through the needle.  Therefore, a short 0.035 inches wire was advanced through the needle and coiled within the collection.  The tract was then serially dilated to 53- [place].  A 12 French Cook multipurpose drainage catheter was then advanced over the wire and coiled within the multiloculated fluid collection using a combination of CT fluoroscopy and axial CT imaging.  Approximately 30 ml of turbid serosanguinous material was successfully aspirated.  The catheter was secured to the skin with O Prolene suture and bumper technique.  The patient tolerated the procedure well, there is no immediate complication.  IMPRESSION:  1.   Successful placement of a 12-French drainage catheter into the dominant fluid collection adjacent to the appendix with aspiration of 200 ml frankly purulent material.  A sample was sent for Gram stain and culture. To the CT imaging findings, and aspiration of frankly purulent material this collection is highly suspicious for ruptured appendicitis.  2.  Successful placement of a second 12-French drainage catheter into the highly loculated fluid collection just caudal to but abutting the dominant fluid collection.  Successful aspiration of approximately 30 ml of turbid serosanguineous fluid.  The fluid in this more loculated collection is clearly different than the fluid in the adjacent dominant fluid collection.  This second collection may represent a primary, or secondary complex hydrocele or tuboovarian abscess.  Maintaining drains to bulb suction.  Following normalization of clinical signs of sepsis, leukocytosis and minimal drain output, recommend repeat CT scan of the abdomen pelvis with contrast prior to drain removal.  Signed,  Criselda Peaches, MD Vascular & Interventional Radiologist [facility]   Original Report Authenticated By: Criselda Peaches, M.D.    Ct Guided Abscess Drain  08/21/2012  *RADIOLOGY REPORT*  CT GUIDED ABSCESS DRAIN X2  Date: 08/20/2012  Clinical History: 31 year old female with right lower quadrant pelvic abscess and clinical sepsis.  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  She has a complex multiloculated fluid collection in the right lower quadrant in the region of the adnexa, and appendix concerning for ruptured appendicitis and / or tubal ovarian abscess.  Interventional radiology is consulted for percutaneous drain placement as she  is a suboptimal surgical candidate given her complicated past history.  Procedures Performed: 1. CT guided abscess drain placement x2  Interventional Radiologist:  Criselda Peaches, MD  Sedation: Moderate (conscious) sedation was used.  Six mg Versed, 400 mcg Fentanyl were administered intravenously.  The patient's vital signs were monitored continuously by radiology nursing throughout the procedure.  Sedation Time: 60 minutes  PROCEDURE/FINDINGS:   Informed consent was obtained from the patient following explanation of the procedure, risks, benefits and alternatives. The patient understands, agrees and consents for the procedure. All questions were addressed. A time out was performed.  Maximal barrier sterile technique utilized including caps, mask, sterile gowns, sterile gloves, large sterile drape, hand hygiene, and betadine skin prep.  A planning axial CT scan was performed.  Both the dominant fluid collection immediately adjacent to the appendix, and more complex multiloculated collection slightly more caudally within the pelvis are identified.  To the skin entry site was selected, warranted to drain the dominant fluid collection, and the second to place a drain in the more multiloculated component.  Both locations were marked with CT fluoroscopic guidance.  Local anesthesia was achieved with infiltration of 1% lidocaine. Under direct CT fluoroscopic guidance, an 18 gauge trocar needle was advanced through the skin, subcutaneous fat, abdominal wall and into the dominant fluid collection.  There was return of frankly purulent material through the needle.  Therefore, a short 0.035 inches wire was coiled within the dominant fluid collection and the tract serially dilated to 12-French.  A 12 French Cook multipurpose drainage catheter was then advanced over the wire and positioned within the dominant fluid collection using a combination of CT fluoroscopy and axial CT imaging.  Approximately 200 ml of frankly  purulent material was then successfully aspirated. A sample was s sent for Gram stain and culture.  The tube was secured to the skin using both Prolene suture and bumper technique.  Local anesthesia of the second to the entry site was then achieved with infiltration of 1% lidocaine.  Using similar CT fluoroscopic technique, an 18 gauge trocar needle was advanced to the skin, soft tissues, abdominal wall and into the complex multiloculated collection just caudal to the first drain.  Turbid serosanguinous fluid was successfully aspirated through the needle.  Therefore, a short 0.035 inches wire was advanced through the needle and coiled within the collection.  The tract was then serially dilated to 55- [place].  A 12 French Cook multipurpose drainage catheter was then advanced over the wire and coiled within the multiloculated fluid collection using a combination of CT fluoroscopy and axial CT imaging.  Approximately 30 ml of turbid serosanguinous material was successfully aspirated.  The catheter was secured to the skin with O Prolene suture and bumper technique.  The patient tolerated the procedure well, there is no immediate complication.  IMPRESSION:  1.  Successful placement of a 12-French drainage catheter into the dominant fluid  collection adjacent to the appendix with aspiration of 200 ml frankly purulent material.  A sample was sent for Gram stain and culture. To the CT imaging findings, and aspiration of frankly purulent material this collection is highly suspicious for ruptured appendicitis.  2.  Successful placement of a second 12-French drainage catheter into the highly loculated fluid collection just caudal to but abutting the dominant fluid collection.  Successful aspiration of approximately 30 ml of turbid serosanguineous fluid.  The fluid in this more loculated collection is clearly different than the fluid in the adjacent dominant fluid collection.  This second collection may represent a primary, or  secondary complex hydrocele or tuboovarian abscess.  Maintaining drains to bulb suction.  Following normalization of clinical signs of sepsis, leukocytosis and minimal drain output, recommend repeat CT scan of the abdomen pelvis with contrast prior to drain removal.  Signed,  Criselda Peaches, MD Vascular & Interventional Radiologist [facility]   Original Report Authenticated By: Criselda Peaches, M.D.      Anti-infectives:   Anti-infectives     Start     Dose/Rate Route Frequency Ordered Stop   08/20/12 0500  piperacillin-tazobactam (ZOSYN) IVPB 3.375 g       3.375 g 12.5 mL/hr over 240 Minutes Intravenous Every 8 hours 08/19/12 2210     08/19/12 2300   fluconazole (DIFLUCAN) IVPB 400 mg        400 mg 200 mL/hr over 60 Minutes Intravenous Every 24 hours 08/19/12 2145     08/19/12 2100  piperacillin-tazobactam (ZOSYN) IVPB 3.375 g       3.375 g 12.5 mL/hr over 240 Minutes Intravenous  Once 08/19/12 2014 08/20/12 0048   08/19/12 2000   cefTRIAXone (ROCEPHIN) 2 g in dextrose 5 % 50 mL IVPB        2 g 100 mL/hr over 30 Minutes Intravenous To Emergency Dept 08/19/12 1915 08/19/12 2035          Alphonsa Overall, MD, [place] Pager: [phone],   [place] [facility] Office: [phone] 08/22/2012

## 2012-08-22 NOTE — Progress Notes (Signed)
Request for Medical Records from [facility] for the dates of 04/2009 - 10/2011 faxed on 10/8.    MR have not been received.  Request re-faxed to MR at [facility] - [phone]. dph

## 2012-08-22 NOTE — Progress Notes (Signed)
Records received from [facility] and placed in pt's chart. dph

## 2012-08-22 NOTE — Progress Notes (Signed)
Subjective: Pt without new c/o ; still has some intermittent nausea, RLQ pain  Objective: Vital signs in last 24 hours: Temp:  [98.5 F (36.9 C)-102.6 F (39.2 C)] 100.2 F (37.9 C) (10/09 1200) Pulse Rate:  [103-142] 122  (10/09 1422) Resp:  [19-35] 34  (10/09 1422) BP: (86-120)/(51-105) 110/71 mmHg (10/09 1422) SpO2:  [95 %-100 %] 96 % (10/09 1422) Weight:  [260 lb 2.3 oz (118 kg)] 260 lb 2.3 oz (118 kg) (10/09 0000) Last BM Date: 08/22/12  Intake/Output from previous day: 10/08 0701 - 10/09 0700 In: 3925 [I.V.:3600; IV Piggyback:325] Out: 135 [Drains:135] Intake/Output this shift: Total I/O In: 1430 [P.O.:360; I.V.:1050; Other:20] Out: 395 [Drains:45; Stool:350]  RLQ drains intact, outputs 35/10 respectively; insertion sites ok; perit fluid cx's pend  Lab Results:   Basename 08/22/12 0320 08/21/12 0317  WBC 17.7* 17.6*  HGB 8.5* 8.5*  HCT 24.9* 24.8*  PLT 410* 389   BMET  Basename 08/22/12 0320 08/21/12 0317  NA 137 136  K 3.8 3.6  CL 105 104  CO2 18* 18*  GLUCOSE 118* 179*  BUN 5* 7  CREATININE 0.94 1.11*  CALCIUM 8.4 8.1*   PT/INR No results found for this basename: LABPROT:2,INR:2 in the last 72 hours ABG  Basename 08/19/12 1732  PHART --  HCO3 23.8   Results for orders placed during the hospital encounter of 08/19/12  URINE CULTURE     Status: Normal   Collection Time   08/19/12  5:49 PM      Component Value Range Status Comment   Specimen Description URINE, CATHETERIZED   Final    Special Requests NONE   Final    Culture  Setup Time 08/20/2012 12:23   Final    Colony Count >=100,000 COLONIES/ML   Final    Culture KLEBSIELLA PNEUMONIAE   Final    Report Status 08/22/2012 FINAL   Final    Organism ID, Bacteria KLEBSIELLA PNEUMONIAE   Final   CULTURE, BLOOD (ROUTINE X 2)     Status: Normal (Preliminary result)   Collection Time   08/19/12 11:00 PM      Component Value Range Status Comment   Specimen Description BLOOD RIGHT ANTECUBITAL    Final    Special Requests BOTTLES DRAWN AEROBIC ONLY 3CC   Final    Culture  Setup Time 08/20/2012 12:19   Final    Culture     Final    Value:        BLOOD CULTURE RECEIVED NO GROWTH TO DATE CULTURE WILL BE HELD FOR 5 DAYS BEFORE ISSUING A FINAL NEGATIVE REPORT   Report Status PENDING   Incomplete   CULTURE, BLOOD (ROUTINE X 2)     Status: Normal (Preliminary result)   Collection Time   08/19/12 11:15 PM      Component Value Range Status Comment   Specimen Description BLOOD LEFT ANTECUBITAL   Final    Special Requests BOTTLES DRAWN AEROBIC AND ANAEROBIC 5CC   Final    Culture  Setup Time 08/20/2012 12:19   Final    Culture     Final    Value:        BLOOD CULTURE RECEIVED NO GROWTH TO DATE CULTURE WILL BE HELD FOR 5 DAYS BEFORE ISSUING A FINAL NEGATIVE REPORT   Report Status PENDING   Incomplete   MRSA PCR SCREENING     Status: Normal   Collection Time   08/20/12 12:22 AM      Component Value Range  Status Comment   MRSA by PCR NEGATIVE  NEGATIVE Final   CULTURE, ROUTINE-ABSCESS     Status: Normal (Preliminary result)   Collection Time   08/20/12 10:24 AM      Component Value Range Status Comment   Specimen Description PERINEAL   Final    Special Requests NONE   Final    Gram Stain     Final    Value: ABUNDANT WBC PRESENT,BOTH PMN AND MONONUCLEAR     NO SQUAMOUS EPITHELIAL CELLS SEEN     MODERATE GRAM POSITIVE COCCI     IN PAIRS MODERATE GRAM NEGATIVE RODS     FEW GRAM POSITIVE RODS   Culture NO GROWTH   Final    Report Status PENDING   Incomplete   ANAEROBIC CULTURE     Status: Normal (Preliminary result)   Collection Time   08/20/12  2:57 PM      Component Value Range Status Comment   Specimen Description PERINEAL   Final    Special Requests NONE   Final    Gram Stain     Final    Value: ABUNDANT WBC PRESENT,BOTH PMN AND MONONUCLEAR     NO SQUAMOUS EPITHELIAL CELLS SEEN     MODERATE GRAM POSITIVE COCCI     IN PAIRS MODERATE GRAM NEGATIVE RODS     FEW GRAM POSITIVE RODS    Culture     Final    Value: NO ANAEROBES ISOLATED; CULTURE IN PROGRESS FOR 5 DAYS   Report Status PENDING   Incomplete   CULTURE, ROUTINE-ABSCESS     Status: Normal (Preliminary result)   Collection Time   08/20/12  3:26 PM      Component Value Range Status Comment   Specimen Description PERITONEAL CAVITY   Final    Special Requests NONE   Final    Gram Stain     Final    Value: RARE WBC PRESENT, PREDOMINANTLY PMN     NO SQUAMOUS EPITHELIAL CELLS SEEN     NO ORGANISMS SEEN   Culture NO GROWTH 1 DAY   Final    Report Status PENDING   Incomplete   ANAEROBIC CULTURE     Status: Normal (Preliminary result)   Collection Time   08/20/12  3:26 PM      Component Value Range Status Comment   Specimen Description PERITONEAL CAVITY   Final    Special Requests NONE   Final    Gram Stain     Final    Value: RARE WBC PRESENT, PREDOMINANTLY PMN     NO SQUAMOUS EPITHELIAL CELLS SEEN     NO ORGANISMS SEEN   Culture     Final    Value: NO ANAEROBES ISOLATED; CULTURE IN PROGRESS FOR 5 DAYS   Report Status PENDING   Incomplete      Studies/Results: Ct Guided Abscess Drain  08/21/2012  *RADIOLOGY REPORT*  CT GUIDED ABSCESS DRAIN X2  Date: 08/20/2012  Clinical History: 31 year old female with right lower quadrant pelvic abscess and clinical sepsis.  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  She has a complex multiloculated fluid collection in the right lower quadrant in the region of the adnexa, and appendix concerning for ruptured appendicitis and / or tubal ovarian abscess.  Interventional radiology is consulted for percutaneous  drain placement as she is a suboptimal surgical candidate given her complicated past history.  Procedures Performed: 1. CT guided abscess drain  placement x2  Interventional Radiologist:  Criselda Peaches, MD  Sedation: Moderate (conscious) sedation was used.  Six mg Versed, 400 mcg Fentanyl were administered intravenously.  The patient's vital signs were monitored continuously by radiology nursing throughout the procedure.  Sedation Time: 60 minutes  PROCEDURE/FINDINGS:   Informed consent was obtained from the patient following explanation of the procedure, risks, benefits and alternatives. The patient understands, agrees and consents for the procedure. All questions were addressed. A time out was performed.  Maximal barrier sterile technique utilized including caps, mask, sterile gowns, sterile gloves, large sterile drape, hand hygiene, and betadine skin prep.  A planning axial CT scan was performed.  Both the dominant fluid collection immediately adjacent to the appendix, and more complex multiloculated collection slightly more caudally within the pelvis are identified.  To the skin entry site was selected, warranted to drain the dominant fluid collection, and the second to place a drain in the more multiloculated component.  Both locations were marked with CT fluoroscopic guidance.  Local anesthesia was achieved with infiltration of 1% lidocaine. Under direct CT fluoroscopic guidance, an 18 gauge trocar needle was advanced through the skin, subcutaneous fat, abdominal wall and into the dominant fluid collection.  There was return of frankly purulent material through the needle.  Therefore, a short 0.035 inches wire was coiled within the dominant fluid collection and the tract serially dilated to 12-French.  A 12 French Cook multipurpose drainage catheter was then advanced over the wire and positioned within the dominant fluid collection using a combination of CT fluoroscopy and axial CT imaging.  Approximately 200 ml of frankly purulent material was then successfully aspirated. A sample was s sent for Gram stain and culture.  The tube was secured to  the skin using both Prolene suture and bumper technique.  Local anesthesia of the second to the entry site was then achieved with infiltration of 1% lidocaine.  Using similar CT fluoroscopic technique, an 18 gauge trocar needle was advanced to the skin, soft tissues, abdominal wall and into the complex multiloculated collection just caudal to the first drain.  Turbid serosanguinous fluid was successfully aspirated through the needle.  Therefore, a short 0.035 inches wire was advanced through the needle and coiled within the collection.  The tract was then serially dilated to 67- [place].  A 12 French Cook multipurpose drainage catheter was then advanced over the wire and coiled within the multiloculated fluid collection using a combination of CT fluoroscopy and axial CT imaging.  Approximately 30 ml of turbid serosanguinous material was successfully aspirated.  The catheter was secured to the skin with O Prolene suture and bumper technique.  The patient tolerated the procedure well, there is no immediate complication.  IMPRESSION:  1.  Successful placement of a 12-French drainage catheter into the dominant fluid collection adjacent to the appendix with aspiration of 200 ml frankly purulent material.  A sample was sent for Gram stain and culture. To the CT imaging findings, and aspiration of frankly purulent material this collection is highly suspicious for ruptured appendicitis.  2.  Successful placement of a second 12-French drainage catheter into the highly loculated fluid collection just caudal to but abutting the dominant fluid collection.  Successful aspiration of approximately 30 ml of turbid serosanguineous fluid.  The fluid in this more loculated collection is clearly different than the fluid in the  adjacent dominant fluid collection.  This second collection may represent a primary, or secondary complex hydrocele or tuboovarian abscess.  Maintaining drains to bulb suction.  Following normalization of clinical  signs of sepsis, leukocytosis and minimal drain output, recommend repeat CT scan of the abdomen pelvis with contrast prior to drain removal.  Signed,  Criselda Peaches, MD Vascular & Interventional Radiologist [facility] Radiology   Original Report Authenticated By: Criselda Peaches, M.D.    Ct Guided Abscess Drain  08/21/2012  *RADIOLOGY REPORT*  CT GUIDED ABSCESS DRAIN X2  Date: 08/20/2012  Clinical History: 31 year old female with right lower quadrant pelvic abscess and clinical sepsis.  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  She has a complex multiloculated fluid collection in the right lower quadrant in the region of the adnexa, and appendix concerning for ruptured appendicitis and / or tubal ovarian abscess.  Interventional radiology is consulted for percutaneous drain placement as she is a suboptimal surgical candidate given her complicated past history.  Procedures Performed: 1. CT guided abscess drain placement x2  Interventional Radiologist:  Criselda Peaches, MD  Sedation: Moderate (conscious) sedation was used.  Six mg Versed, 400 mcg Fentanyl were administered intravenously.  The patient's vital signs were monitored continuously by radiology nursing throughout the procedure.  Sedation Time: 60 minutes  PROCEDURE/FINDINGS:   Informed consent was obtained from the patient following explanation of the procedure, risks, benefits and alternatives. The patient understands, agrees and consents for the procedure. All questions were addressed. A time out was performed.  Maximal barrier sterile technique utilized including caps, mask, sterile gowns, sterile gloves, large sterile drape, hand hygiene, and betadine skin prep.  A planning axial CT scan was performed.   Both the dominant fluid collection immediately adjacent to the appendix, and more complex multiloculated collection slightly more caudally within the pelvis are identified.  To the skin entry site was selected, warranted to drain the dominant fluid collection, and the second to place a drain in the more multiloculated component.  Both locations were marked with CT fluoroscopic guidance.  Local anesthesia was achieved with infiltration of 1% lidocaine. Under direct CT fluoroscopic guidance, an 18 gauge trocar needle was advanced through the skin, subcutaneous fat, abdominal wall and into the dominant fluid collection.  There was return of frankly purulent material through the needle.  Therefore, a short 0.035 inches wire was coiled within the dominant fluid collection and the tract serially dilated to 12-French.  A 12 French Cook multipurpose drainage catheter was then advanced over the wire and positioned within the dominant fluid collection using a combination of CT fluoroscopy and axial CT imaging.  Approximately 200 ml of frankly purulent material was then successfully aspirated. A sample was s sent for Gram stain and culture.  The tube was secured to the skin using both Prolene suture and bumper technique.  Local anesthesia of the second to the entry site was then achieved with infiltration of 1% lidocaine.  Using similar CT fluoroscopic technique, an 18 gauge trocar needle was advanced to the skin, soft tissues, abdominal wall and into the complex multiloculated collection just caudal to the first drain.  Turbid serosanguinous fluid was successfully aspirated through the needle.  Therefore, a short 0.035 inches wire was advanced through the needle and coiled within the collection.  The tract was then serially dilated to 70- [place].  A 12 French Cook multipurpose drainage catheter was then advanced over the wire and coiled within the multiloculated fluid collection using a combination of CT fluoroscopy and axial  CT imaging.  Approximately 30 ml of turbid serosanguinous material was successfully aspirated.  The catheter was secured to the skin with O Prolene suture and bumper technique.  The patient tolerated the procedure well, there is no immediate complication.  IMPRESSION:  1.  Successful placement of a 12-French drainage catheter into the dominant fluid collection adjacent to the appendix with aspiration of 200 ml frankly purulent material.  A sample was sent for Gram stain and culture. To the CT imaging findings, and aspiration of frankly purulent material this collection is highly suspicious for ruptured appendicitis.  2.  Successful placement of a second 12-French drainage catheter into the highly loculated fluid collection just caudal to but abutting the dominant fluid collection.  Successful aspiration of approximately 30 ml of turbid serosanguineous fluid.  The fluid in this more loculated collection is clearly different than the fluid in the adjacent dominant fluid collection.  This second collection may represent a primary, or secondary complex hydrocele or tuboovarian abscess.  Maintaining drains to bulb suction.  Following normalization of clinical signs of sepsis, leukocytosis and minimal drain output, recommend repeat CT scan of the abdomen pelvis with contrast prior to drain removal.  Signed,  Criselda Peaches, MD Vascular & Interventional Radiologist [facility] Radiology   Original Report Authenticated By: Criselda Peaches, M.D.     Anti-infectives: Anti-infectives     Start     Dose/Rate Route Frequency Ordered Stop   08/20/12 0500   piperacillin-tazobactam (ZOSYN) IVPB 3.375 g        3.375 g 12.5 mL/hr over 240 Minutes Intravenous Every 8 hours 08/19/12 2210     08/19/12 2300   fluconazole (DIFLUCAN) IVPB 400 mg        400 mg 200 mL/hr over 60 Minutes Intravenous Every 24 hours 08/19/12 2145     08/19/12 2100   piperacillin-tazobactam (ZOSYN) IVPB 3.375 g        3.375 g 12.5 mL/hr  over 240 Minutes Intravenous  Once 08/19/12 2014 08/20/12 0048   08/19/12 2000   cefTRIAXone (ROCEPHIN) 2 g in dextrose 5 % 50 mL IVPB        2 g 100 mL/hr over 30 Minutes Intravenous To Emergency Dept 08/19/12 1915 08/19/12 2035          Assessment/Plan: S/p RLQ fluid coll drainages 10/7; plans as per [facility]; check f/u CT near end of week esp if outputs remain low  LOS: 3 days    ALLRED,D [facility] 08/22/2012

## 2012-08-22 NOTE — Consult Note (Signed)
[facility] CONSULT FOLLOW-UP NOTE  [name] [id number] DOB: 1981/02/24 DOA: 08/19/2012 PCP: Mack Hook, MD Requesting physician: Michael Boston, MD Attending service: General Surgery Reason for consultation: diabetes, assistance with medical management  Impression/Recommendations: 1. Sepsis--Improving. Secondary to below. Blood cultures pending but negative to date, urine culture as below, intraabdominal fluid culture pending. Has been afebrile overnight , hypotension improved. Continue Zosyn, IVF. Management per surgery. Aggressive IVF. 2. Suspected perforated appendicitis--management deferred to general surgery. Agree with empiric Zosyn. Has had perc drains placed by IR. 3. DM type 2 uncontrolled--Good control on current regimen. 4. UTI--Cx with Klebsiella, sensitive to zosyn. 5 days of treatment should suffice. 5. Normocytic anemia--stable, suspect chronic with possible component of anemia with critical illness. Follow closely, serial CBC, Transfuse <7. 6. HTN--hold lisinopril until condition improves. 7. History of PE/DVT 2010--SCDs pending procedure and surgery evaluation.  8. History of bilateral oopherectomy and small bowel resection in 2010 at [facility]  No improvement in acute issues thus far. DM, anemia, HTN, chronic medical issues appear stable. Will continue to follow for now.  Code Status: Full code Family Communication: none present Disposition Plan: per surgery   Domingo Mend, MD [facility] Pager: [phone]   If 8PM-8AM, please contact night-coverage at [email or web address], password [facility] 08/22/2012, 3:37 PM  LOS: 3 days   Brief narrative: 31 year old woman with history of 5 days of fever, flank pain, chills, nausea, vomiting, dysuria. CT ab/pelvis suspicious for ruptured appendix with free air in abdomen. Admitted by surgery and medicine consulted for assistance with DM and medical issues.  Procedures:    Antibiotics:  Zosyn 10/6  >>  Fluconazole 10/6 >>  HPI/Subjective: Seen earlier today. Still has pain to the RLQ. No n/v.  Objective: Filed Vitals:   08/22/12 1000 08/22/12 1100 08/22/12 1200 08/22/12 1422  BP: 119/72 110/68 110/79 110/71  Pulse: 109 109 103 122  Temp:   100.2 F (37.9 C)   TempSrc:   Oral   Resp: 20 28 25  34  Height:      Weight:      SpO2: 99% 96% 95% 96%    Intake/Output Summary (Last 24 hours) at 08/22/12 1537 Last data filed at 08/22/12 1434  Gross per 24 hour  Intake 4117.5 ml  Output    475 ml  Net 3642.5 ml    Exam:  General:  Appears calm, uncomfortable, ill but not toxic Cardiovascular: RRR, no m/r/g. No LE edema. Respiratory: CTA bilaterally, no w/r/r. Normal respiratory effort. tachypneic. Psychiatric: grossly normal mood and affect, speech fluent and appropriate  Data Reviewed: Basic Metabolic Panel:  Lab [id number] 0320 08/21/12 0317 08/20/12 0332 08/19/12 1735  NA 137 136 135 131*  K 3.8 3.6 3.5 4.0  CL 105 104 101 91*  CO2 18* 18* 22 22  GLUCOSE 118* 179* 238* 374*  BUN 5* 7 7 11   CREATININE 0.94 1.11* 0.92 1.23*  CALCIUM 8.4 8.1* 8.0* 9.4  MG -- -- -- --  PHOS -- -- -- --   Liver Function Tests:  Lab 08/20/12 0332 08/19/12 1735  AST 8 9  ALT 10 14  ALKPHOS 121* 168*  BILITOT 0.3 0.3  PROT 6.6 8.6*  ALBUMIN 2.3* 2.8*    Lab 08/19/12 1735  LIPASE 19  AMYLASE --   CBC:  Lab 08/22/12 0320 08/21/12 0317 08/20/12 1055 08/20/12 0332 08/19/12 1735  WBC 17.7* 17.6* 23.5* 20.1* 19.0*  NEUTROABS -- 14.8* -- 16.6* 16.2*  HGB 8.5* 8.5* 9.3* 8.5* 11.4*  HCT 24.9* 24.8* 27.0* 25.0*  33.3*  MCV 82.7 83.5 82.8 82.5 82.8  PLT 410* 389 452* 421* 563*   CBG:  Lab 08/22/12 1126 08/22/12 0743 08/22/12 0342 08/22/12 0004 08/21/12 2156  GLUCAP 119* 119* 105* 97 124*    Recent Results (from the past 240 hour(s))  URINE CULTURE     Status: Normal   Collection Time   08/19/12  5:49 PM      Component Value Range Status Comment   Specimen Description  URINE, CATHETERIZED   Final    Special Requests NONE   Final    Culture  Setup Time 08/20/2012 12:23   Final    Colony Count >=100,000 COLONIES/ML   Final    Culture KLEBSIELLA PNEUMONIAE   Final    Report Status 08/22/2012 FINAL   Final    Organism ID, Bacteria KLEBSIELLA PNEUMONIAE   Final   CULTURE, BLOOD (ROUTINE X 2)     Status: Normal (Preliminary result)   Collection Time   08/19/12 11:00 PM      Component Value Range Status Comment   Specimen Description BLOOD RIGHT ANTECUBITAL   Final    Special Requests BOTTLES DRAWN AEROBIC ONLY 3CC   Final    Culture  Setup Time 08/20/2012 12:19   Final    Culture     Final    Value:        BLOOD CULTURE RECEIVED NO GROWTH TO DATE CULTURE WILL BE HELD FOR 5 DAYS BEFORE ISSUING A FINAL NEGATIVE REPORT   Report Status PENDING   Incomplete   CULTURE, BLOOD (ROUTINE X 2)     Status: Normal (Preliminary result)   Collection Time   08/19/12 11:15 PM      Component Value Range Status Comment   Specimen Description BLOOD LEFT ANTECUBITAL   Final    Special Requests BOTTLES DRAWN AEROBIC AND ANAEROBIC 5CC   Final    Culture  Setup Time 08/20/2012 12:19   Final    Culture     Final    Value:        BLOOD CULTURE RECEIVED NO GROWTH TO DATE CULTURE WILL BE HELD FOR 5 DAYS BEFORE ISSUING A FINAL NEGATIVE REPORT   Report Status PENDING   Incomplete   MRSA PCR SCREENING     Status: Normal   Collection Time   08/20/12 12:22 AM      Component Value Range Status Comment   MRSA by PCR NEGATIVE  NEGATIVE Final   CULTURE, ROUTINE-ABSCESS     Status: Normal   Collection Time   08/20/12 10:24 AM      Component Value Range Status Comment   Specimen Description PERINEAL   Final    Special Requests NONE   Final    Gram Stain     Final    Value: ABUNDANT WBC PRESENT,BOTH PMN AND MONONUCLEAR     NO SQUAMOUS EPITHELIAL CELLS SEEN     MODERATE GRAM POSITIVE COCCI     IN PAIRS MODERATE GRAM NEGATIVE RODS     FEW GRAM POSITIVE RODS   Culture     Final    Value:  GROUP B STREP(S.AGALACTIAE)ISOLATED     Note: TESTING AGAINST S. AGALACTIAE NOT ROUTINELY PERFORMED DUE TO PREDICTABILITY OF AMP/PEN/VAN SUSCEPTIBILITY.   Report Status 08/22/2012 FINAL   Final   ANAEROBIC CULTURE     Status: Normal (Preliminary result)   Collection Time   08/20/12  2:57 PM      Component Value Range Status Comment   Specimen  Description PERINEAL   Final    Special Requests NONE   Final    Gram Stain     Final    Value: ABUNDANT WBC PRESENT,BOTH PMN AND MONONUCLEAR     NO SQUAMOUS EPITHELIAL CELLS SEEN     MODERATE GRAM POSITIVE COCCI     IN PAIRS MODERATE GRAM NEGATIVE RODS     FEW GRAM POSITIVE RODS   Culture     Final    Value: NO ANAEROBES ISOLATED; CULTURE IN PROGRESS FOR 5 DAYS   Report Status PENDING   Incomplete   CULTURE, ROUTINE-ABSCESS     Status: Normal (Preliminary result)   Collection Time   08/20/12  3:26 PM      Component Value Range Status Comment   Specimen Description PERITONEAL CAVITY   Final    Special Requests NONE   Final    Gram Stain     Final    Value: RARE WBC PRESENT, PREDOMINANTLY PMN     NO SQUAMOUS EPITHELIAL CELLS SEEN     NO ORGANISMS SEEN   Culture NO GROWTH 1 DAY   Final    Report Status PENDING   Incomplete   ANAEROBIC CULTURE     Status: Normal (Preliminary result)   Collection Time   08/20/12  3:26 PM      Component Value Range Status Comment   Specimen Description PERITONEAL CAVITY   Final    Special Requests NONE   Final    Gram Stain     Final    Value: RARE WBC PRESENT, PREDOMINANTLY PMN     NO SQUAMOUS EPITHELIAL CELLS SEEN     NO ORGANISMS SEEN   Culture     Final    Value: NO ANAEROBES ISOLATED; CULTURE IN PROGRESS FOR 5 DAYS   Report Status PENDING   Incomplete      Studies: No results found. Scheduled Meds:    . antiseptic oral rinse  15 mL Mouth Rinse q12n4p  . chlorhexidine  15 mL Mouth Rinse BID  . citalopram  20 mg Oral Daily  . enoxaparin (LOVENOX) injection  40 mg Subcutaneous Daily  . fluconazole  (DIFLUCAN) IV  400 mg Intravenous Q24H  . insulin aspart  0-20 Units Subcutaneous Q4H  . insulin aspart  0-5 Units Subcutaneous QHS  . insulin glargine  15 Units Subcutaneous QHS  . lip balm  1 application Topical BID  . piperacillin-tazobactam (ZOSYN)  IV  3.375 g Intravenous Q8H   Continuous Infusions:    . 0.45 % NaCl with KCl 20 mEq / L 150 mL/hr at 08/22/12 1249     Active Problems:  DIABETES MELLITUS, TYPE II, UNCONTROLLED  URINALYSIS, ABNORMAL  Obesity (BMI 30-39.9)  Anxiety  Depression  Noncompliance to therapies / medications  Sepsis  CKD (chronic kidney disease)  UTI (lower urinary tract infection)  Anemia  HTN (hypertension)    Time Spent: 25 minutes  Domingo Mend, MD [facility] Pager: [phone]  If 8PM-8AM, please contact night-coverage at [email or web address], password [facility] 08/22/2012, 3:37 PM  LOS: 3 days

## 2012-08-23 DIAGNOSIS — E669 Obesity, unspecified: Secondary | ICD-10-CM

## 2012-08-23 DIAGNOSIS — D500 Iron deficiency anemia secondary to blood loss (chronic): Secondary | ICD-10-CM

## 2012-08-23 LAB — BASIC METABOLIC PANEL
Chloride: 103 mEq/L (ref 96–112)
GFR calc Af Amer: >90 mL/min (ref >90)
Potassium: 4 mEq/L (ref 3.5–5.1)
Sodium: 134 mEq/L — ABNORMAL LOW (ref 135–145)

## 2012-08-23 LAB — CBC WITH DIFFERENTIAL/PLATELET
Basophils Absolute: 0 10*3/uL (ref 0.0–0.1)
Basophils Relative: 0 % (ref 0–1)
MCHC: 34.6 g/dL (ref 30.0–36.0)
Neutro Abs: 13.2 10*3/uL — ABNORMAL HIGH (ref 1.7–7.7)
Neutrophils Relative %: 83 % — ABNORMAL HIGH (ref 43–77)
RDW: 13.6 % (ref 11.5–15.5)
WBC: 15.9 10*3/uL — ABNORMAL HIGH (ref 4.0–10.5)

## 2012-08-23 LAB — GLUCOSE, CAPILLARY
Glucose-Capillary: 138 mg/dL — ABNORMAL HIGH (ref 70–99)
Glucose-Capillary: 97 mg/dL (ref 70–99)
Glucose-Capillary: 109 mg/dL — ABNORMAL HIGH (ref 70–99)
Glucose-Capillary: 112 mg/dL — ABNORMAL HIGH (ref 70–99)
Glucose-Capillary: 131 mg/dL — ABNORMAL HIGH (ref 70–99)

## 2012-08-23 MED ORDER — FERROUS SULFATE 325 (65 FE) MG PO TABS
325.0000 mg | ORAL_TABLET | Freq: Three times a day (TID) | ORAL | Status: DC
  Administered 2012-08-23 – 2012-09-04 (×34): 325 mg via ORAL
  Filled 2012-08-23 (×44): qty 1

## 2012-08-23 NOTE — Consult Note (Signed)
[facility] CONSULT FOLLOW-UP NOTE  [name] [id number] DOB: 10-21-1981 DOA: 08/19/2012 PCP: Mack Hook, MD Requesting physician: Michael Boston, MD Attending service: General Surgery Reason for consultation: diabetes, assistance with medical management  Impression/Recommendations: 1. Sepsis--Improving. Secondary to below. Blood cultures pending but negative to date, urine culture as below, intraabdominal fluid culture pending. Has a temp of 101.4 overnight , hypotension improved. Continue Zosyn, IVF. Management per surgery. Aggressive IVF. 2. Suspected perforated appendicitis--management deferred to general surgery. Agree with empiric Zosyn. Has had perc drains placed by IR. ?timing of repeat CT abdomen. 3. DM type 2--Good control on current regimen. 4. UTI--Cx with Klebsiella, sensitive to zosyn. 5 days of treatment should suffice. 5. Normocytic anemia--stable, suspect chronic with possible component of anemia with critical illness. Follow closely, serial CBC, Transfuse <7. 6. HTN--hold lisinopril until condition improves. 7. History of PE/DVT 2010--SCDs pending procedure and surgery evaluation.  8. History of bilateral oopherectomy and small bowel resection in 2010 at [facility]  No improvement in acute issues thus far. DM, anemia, HTN, chronic medical issues appear stable. Will continue to follow for now.  Code Status: Full code Family Communication: none present Disposition Plan: per surgery   Domingo Mend, MD [facility] Pager: [phone]   If 8PM-8AM, please contact night-coverage at [email or web address], password [facility] 08/23/2012, 3:32 PM  LOS: 4 days   Brief narrative: 31 year old woman with history of 5 days of fever, flank pain, chills, nausea, vomiting, dysuria. CT ab/pelvis suspicious for ruptured appendix with free air in abdomen. Admitted by surgery and medicine consulted for assistance with DM and medical  issues.  Procedures:    Antibiotics:  Zosyn 10/6 >>  Fluconazole 10/6 >>  HPI/Subjective: Seen earlier today. Still has pain to the RLQ. No n/v.  Objective: Filed Vitals:   08/23/12 1157 08/23/12 1200 08/23/12 1300 08/23/12 1400  BP: 92/54  119/74 105/60  Pulse: 110  113 117  Temp:  98.7 F (37.1 C)    TempSrc:  Oral    Resp: 31  24 27   Height:      Weight:      SpO2: 96%  99% 100%    Intake/Output Summary (Last 24 hours) at 08/23/12 1532 Last data filed at 08/23/12 1200  Gross per 24 hour  Intake 3427.5 ml  Output   1482 ml  Net 1945.5 ml    Exam:  General:  Appears calm, uncomfortable, ill but not toxic Cardiovascular: RRR, no m/r/g. No LE edema. Respiratory: CTA bilaterally, no w/r/r. Normal respiratory effort. tachypneic. Psychiatric: grossly normal mood and affect, speech fluent and appropriate  Data Reviewed: Basic Metabolic Panel:  Lab [id number] 0320 08/22/12 0320 08/21/12 0317 08/20/12 0332 08/19/12 1735  NA 134* 137 136 135 131*  K 4.0 3.8 3.6 3.5 4.0  CL 103 105 104 101 91*  CO2 20 18* 18* 22 22  GLUCOSE 100* 118* 179* 238* 374*  BUN 3* 5* 7 7 11   CREATININE 0.87 0.94 1.11* 0.92 1.23*  CALCIUM 8.4 8.4 8.1* 8.0* 9.4  MG -- -- -- -- --  PHOS -- -- -- -- --   Liver Function Tests:  Lab 08/20/12 0332 08/19/12 1735  AST 8 9  ALT 10 14  ALKPHOS 121* 168*  BILITOT 0.3 0.3  PROT 6.6 8.6*  ALBUMIN 2.3* 2.8*    Lab 08/19/12 1735  LIPASE 19  AMYLASE --   CBC:  Lab 08/23/12 0320 08/22/12 0320 08/21/12 0317 08/20/12 1055 08/20/12 0332 08/19/12 1735  WBC 15.9* 17.7* 17.6* 23.5* 20.1* --  NEUTROABS 13.2* -- 14.8* -- 16.6* 16.2*  HGB 8.4* 8.5* 8.5* 9.3* 8.5* --  HCT 24.3* 24.9* 24.8* 27.0* 25.0* --  MCV 82.4 82.7 83.5 82.8 82.5 --  PLT 460* 410* 389 452* 421* --   CBG:  Lab 08/23/12 1123 08/23/12 0728 08/23/12 0335 08/23/12 0001 08/22/12 1939  GLUCAP 102* 109* 97 138* 129*    Recent Results (from the past 240 hour(s))  URINE CULTURE      Status: Normal   Collection Time   08/19/12  5:49 PM      Component Value Range Status Comment   Specimen Description URINE, CATHETERIZED   Final    Special Requests NONE   Final    Culture  Setup Time 08/20/2012 12:23   Final    Colony Count >=100,000 COLONIES/ML   Final    Culture KLEBSIELLA PNEUMONIAE   Final    Report Status 08/22/2012 FINAL   Final    Organism ID, Bacteria KLEBSIELLA PNEUMONIAE   Final   CULTURE, BLOOD (ROUTINE X 2)     Status: Normal (Preliminary result)   Collection Time   08/19/12 11:00 PM      Component Value Range Status Comment   Specimen Description BLOOD RIGHT ANTECUBITAL   Final    Special Requests BOTTLES DRAWN AEROBIC ONLY 3CC   Final    Culture  Setup Time 08/20/2012 12:19   Final    Culture     Final    Value:        BLOOD CULTURE RECEIVED NO GROWTH TO DATE CULTURE WILL BE HELD FOR 5 DAYS BEFORE ISSUING A FINAL NEGATIVE REPORT   Report Status PENDING   Incomplete   CULTURE, BLOOD (ROUTINE X 2)     Status: Normal (Preliminary result)   Collection Time   08/19/12 11:15 PM      Component Value Range Status Comment   Specimen Description BLOOD LEFT ANTECUBITAL   Final    Special Requests BOTTLES DRAWN AEROBIC AND ANAEROBIC 5CC   Final    Culture  Setup Time 08/20/2012 12:19   Final    Culture     Final    Value:        BLOOD CULTURE RECEIVED NO GROWTH TO DATE CULTURE WILL BE HELD FOR 5 DAYS BEFORE ISSUING A FINAL NEGATIVE REPORT   Report Status PENDING   Incomplete   MRSA PCR SCREENING     Status: Normal   Collection Time   08/20/12 12:22 AM      Component Value Range Status Comment   MRSA by PCR NEGATIVE  NEGATIVE Final   CULTURE, ROUTINE-ABSCESS     Status: Normal   Collection Time   08/20/12 10:24 AM      Component Value Range Status Comment   Specimen Description PERINEAL   Final    Special Requests NONE   Final    Gram Stain     Final    Value: ABUNDANT WBC PRESENT,BOTH PMN AND MONONUCLEAR     NO SQUAMOUS EPITHELIAL CELLS SEEN      MODERATE GRAM POSITIVE COCCI     IN PAIRS MODERATE GRAM NEGATIVE RODS     FEW GRAM POSITIVE RODS   Culture     Final    Value: GROUP B STREP(S.AGALACTIAE)ISOLATED     Note: TESTING AGAINST S. AGALACTIAE NOT ROUTINELY PERFORMED DUE TO PREDICTABILITY OF AMP/PEN/VAN SUSCEPTIBILITY.   Report Status 08/22/2012 FINAL   Final   ANAEROBIC CULTURE     Status: Normal (Preliminary  result)   Collection Time   08/20/12  2:57 PM      Component Value Range Status Comment   Specimen Description PERINEAL   Final    Special Requests NONE   Final    Gram Stain     Final    Value: ABUNDANT WBC PRESENT,BOTH PMN AND MONONUCLEAR     NO SQUAMOUS EPITHELIAL CELLS SEEN     MODERATE GRAM POSITIVE COCCI     IN PAIRS MODERATE GRAM NEGATIVE RODS     FEW GRAM POSITIVE RODS   Culture     Final    Value: NO ANAEROBES ISOLATED; CULTURE IN PROGRESS FOR 5 DAYS   Report Status PENDING   Incomplete   CULTURE, ROUTINE-ABSCESS     Status: Normal (Preliminary result)   Collection Time   08/20/12  3:26 PM      Component Value Range Status Comment   Specimen Description PERITONEAL CAVITY   Final    Special Requests NONE   Final    Gram Stain     Final    Value: RARE WBC PRESENT, PREDOMINANTLY PMN     NO SQUAMOUS EPITHELIAL CELLS SEEN     NO ORGANISMS SEEN   Culture NO GROWTH 2 DAYS   Final    Report Status PENDING   Incomplete   ANAEROBIC CULTURE     Status: Normal (Preliminary result)   Collection Time   08/20/12  3:26 PM      Component Value Range Status Comment   Specimen Description PERITONEAL CAVITY   Final    Special Requests NONE   Final    Gram Stain     Final    Value: RARE WBC PRESENT, PREDOMINANTLY PMN     NO SQUAMOUS EPITHELIAL CELLS SEEN     NO ORGANISMS SEEN   Culture     Final    Value: NO ANAEROBES ISOLATED; CULTURE IN PROGRESS FOR 5 DAYS   Report Status PENDING   Incomplete      Studies: No results found. Scheduled Meds:    . antiseptic oral rinse  15 mL Mouth Rinse q12n4p  . chlorhexidine   15 mL Mouth Rinse BID  . citalopram  20 mg Oral Daily  . enoxaparin (LOVENOX) injection  40 mg Subcutaneous Daily  . ferrous sulfate  325 mg Oral TID WC  . fluconazole (DIFLUCAN) IV  400 mg Intravenous Q24H  . insulin aspart  0-20 Units Subcutaneous Q4H  . insulin aspart  0-5 Units Subcutaneous QHS  . insulin glargine  15 Units Subcutaneous QHS  . lip balm  1 application Topical BID  . piperacillin-tazobactam (ZOSYN)  IV  3.375 g Intravenous Q8H   Continuous Infusions:    . 0.45 % NaCl with KCl 20 mEq / L 150 mL/hr (08/23/12 0406)     Active Problems:  DIABETES MELLITUS, TYPE II, UNCONTROLLED  URINALYSIS, ABNORMAL  Obesity (BMI 30-39.9)  Anxiety  Depression  Noncompliance to therapies / medications  Sepsis  CKD (chronic kidney disease)  UTI (lower urinary tract infection)  Anemia  HTN (hypertension)    Time Spent: 25 minutes  Domingo Mend, MD [facility] Pager: [phone]  If 8PM-8AM, please contact night-coverage at [email or web address], password [facility] 08/23/2012, 3:32 PM  LOS: 4 days

## 2012-08-23 NOTE — Progress Notes (Signed)
Pt reports that pain seems to be increasing in frequency of onset and intensity in RLQ. Pt reports it feels like "someone is taking a knife and stabbing me." This description of pt's pain is new. VSS. Will continue to [facility].

## 2012-08-23 NOTE — Progress Notes (Signed)
Patient ID: [name], female   DOB: 04-Dec-1980, 31 y.o.   MRN: [id number]    Subjective: Patient states she has a sharp stabby type pain near her drains.  She states that this is new and it has made her "incontinent" twice today.  Tolerating clear liquids.  Objective: Vital signs in last 24 hours: Temp:  [99 F (37.2 C)-102.3 F (39.1 C)] 99 F (37.2 C) (10/10 0345) Pulse Rate:  [103-122] 110  (10/10 0600) Resp:  [20-34] 29  (10/10 0600) BP: (104-119)/(54-79) 113/69 mmHg (10/10 0600) SpO2:  [91 %-99 %] 96 % (10/10 0600) Weight:  [260 lb 9.3 oz (118.2 kg)] 260 lb 9.3 oz (118.2 kg) (10/10 0000) Last BM Date: 08/22/12  Intake/Output from previous day: 10/09 0701 - 10/10 0700 In: 4285 [P.O.:480; I.V.:3450; IV Piggyback:325] Out: 727 [Urine:300; Drains:77; Stool:350] Intake/Output this shift:    PE: Abd: soft, still very tender in abdomen, specifically around her drains, JP#1 with tan purulent output, JP#2 with no output currently.  Few BS, obese  Lab Results:   Basename 08/23/12 0320 08/22/12 0320  WBC 15.9* 17.7*  HGB 8.4* 8.5*  HCT 24.3* 24.9*  PLT 460* 410*   BMET  Basename 08/23/12 0320 08/22/12 0320  NA 134* 137  K 4.0 3.8  CL 103 105  CO2 20 18*  GLUCOSE 100* 118*  BUN 3* 5*  CREATININE 0.87 0.94  CALCIUM 8.4 8.4   PT/INR No results found for this basename: LABPROT:2,INR:2 in the last 72 hours CMP     Component Value Date/Time   NA 134* 08/23/2012 0320   K 4.0 08/23/2012 0320   CL 103 08/23/2012 0320   CO2 20 08/23/2012 0320   GLUCOSE 100* 08/23/2012 0320   BUN 3* 08/23/2012 0320   CREATININE 0.87 08/23/2012 0320   CALCIUM 8.4 08/23/2012 0320   PROT 6.6 08/20/2012 0332   ALBUMIN 2.3* 08/20/2012 0332   AST 8 08/20/2012 0332   ALT 10 08/20/2012 0332   ALKPHOS 121* 08/20/2012 0332   BILITOT 0.3 08/20/2012 0332   GFRNONAA 88* 08/23/2012 0320   GFRAA >90 08/23/2012 0320   Lipase     Component Value Date/Time   LIPASE 19 08/19/2012 1735     Studies/Results: No results found.  Anti-infectives: Anti-infectives     Start     Dose/Rate Route Frequency Ordered Stop   08/20/12 0500  piperacillin-tazobactam (ZOSYN) IVPB 3.375 g       3.375 g 12.5 mL/hr over 240 Minutes Intravenous Every 8 hours 08/19/12 2210     08/19/12 2300   fluconazole (DIFLUCAN) IVPB 400 mg        400 mg 200 mL/hr over 60 Minutes Intravenous Every 24 hours 08/19/12 2145     08/19/12 2100  piperacillin-tazobactam (ZOSYN) IVPB 3.375 g       3.375 g 12.5 mL/hr over 240 Minutes Intravenous  Once 08/19/12 2014 08/20/12 0048   08/19/12 2000   cefTRIAXone (ROCEPHIN) 2 g in dextrose 5 % 50 mL IVPB        2 g 100 mL/hr over 30 Minutes Intravenous To Emergency Dept 08/19/12 1915 08/19/12 2035         Assessment/Plan  1. Intra-abdominal abscess, s/p perc drain, etiology unclear 2. DM  HgbA1c - 13.0 - 08/20/2012 3. Noncompliance 4. HTN 5. Fe deficiency anemia 6.  UTI  > 100,000 Klebsiella 7.  Obesity 8.   Anxiety/Depression  9. Anemia  0. DVT proph  Lovenox  Plan: 1. Cont with drains for  now, will need repeat CT scan this weekend 2. WBC is trending down, but still had fever yesterday.  Cont abx therapy 3. Still awaiting records from [facility] to determine what type of surgery she had a couple of years ago. 4. Cont clears 5. Doubtful patient is truly incontinent, suspect patient does not want to get OOB and voids on herself.  Will watch. 6. Will need Fe replacement for anemia and low Fe   LOS: 4 days    OSBORNE,KELLY E 08/23/2012, 8:28 AM Pager: [phone]  WBC slightly better.  But patient is more tender and pulse is up.  We are not making the progress that I had hoped. The records from [facility] were here, but cannot be found. Labs pending in AM.  Alphonsa Overall, MD, [facility] Pager: [phone] Office phone:  [phone]

## 2012-08-23 NOTE — Progress Notes (Signed)
Subjective: Pt without new c/o ; still has intermittent RLQ pain, sometimes sharp, burning in nature  Objective: Vital signs in last 24 hours: Temp:  [98.7 F (37.1 C)-102.3 F (39.1 C)] 98.7 F (37.1 C) (10/10 1200) Pulse Rate:  [104-117] 117  (10/10 1400) Resp:  [15-33] 27  (10/10 1400) BP: (69-119)/(20-74) 105/60 mmHg (10/10 1400) SpO2:  [91 %-100 %] 100 % (10/10 1400) Weight:  [260 lb 9.3 oz (118.2 kg)] 260 lb 9.3 oz (118.2 kg) (10/10 0000) Last BM Date: 08/22/12  Intake/Output from previous day: 10/09 0701 - 10/10 0700 In: 4285 [P.O.:480; I.V.:3450; IV Piggyback:325] Out: [id number] [Urine:300; Drains:77; Stool:350] Intake/Output this shift: Total I/O In: 772.5 [I.V.:750; Other:10; IV Piggyback:12.5] Out: [id number] [Urine:1150]  RLQ drains intact, outputs 60/13 cc's respect., cx's neg to date  Lab Results:   Basename 08/23/12 0320 08/22/12 0320  WBC 15.9* 17.7*  HGB 8.4* 8.5*  HCT 24.3* 24.9*  PLT 460* 410*   BMET  Basename 08/23/12 0320 08/22/12 0320  NA 134* 137  K 4.0 3.8  CL 103 105  CO2 20 18*  GLUCOSE 100* 118*  BUN 3* 5*  CREATININE 0.87 0.94  CALCIUM 8.4 8.4   PT/INR No results found for this basename: LABPROT:2,INR:2 in the last 72 hours ABG No results found for this basename: PHART:2,PCO2:2,PO2:2,HCO3:2 in the last 72 hours Results for orders placed during the hospital encounter of 08/19/12  URINE CULTURE     Status: Normal   Collection Time   08/19/12  5:49 PM      Component Value Range Status Comment   Specimen Description URINE, CATHETERIZED   Final    Special Requests NONE   Final    Culture  Setup Time 08/20/2012 12:23   Final    Colony Count >=100,000 COLONIES/ML   Final    Culture KLEBSIELLA PNEUMONIAE   Final    Report Status 08/22/2012 FINAL   Final    Organism ID, Bacteria KLEBSIELLA PNEUMONIAE   Final   CULTURE, BLOOD (ROUTINE X 2)     Status: Normal (Preliminary result)   Collection Time   08/19/12 11:00 PM      Component Value Range  Status Comment   Specimen Description BLOOD RIGHT ANTECUBITAL   Final    Special Requests BOTTLES DRAWN AEROBIC ONLY 3CC   Final    Culture  Setup Time 08/20/2012 12:19   Final    Culture     Final    Value:        BLOOD CULTURE RECEIVED NO GROWTH TO DATE CULTURE WILL BE HELD FOR 5 DAYS BEFORE ISSUING A FINAL NEGATIVE REPORT   Report Status PENDING   Incomplete   CULTURE, BLOOD (ROUTINE X 2)     Status: Normal (Preliminary result)   Collection Time   08/19/12 11:15 PM      Component Value Range Status Comment   Specimen Description BLOOD LEFT ANTECUBITAL   Final    Special Requests BOTTLES DRAWN AEROBIC AND ANAEROBIC 5CC   Final    Culture  Setup Time 08/20/2012 12:19   Final    Culture     Final    Value:        BLOOD CULTURE RECEIVED NO GROWTH TO DATE CULTURE WILL BE HELD FOR 5 DAYS BEFORE ISSUING A FINAL NEGATIVE REPORT   Report Status PENDING   Incomplete   MRSA PCR SCREENING     Status: Normal   Collection Time   08/20/12 12:22 AM  Component Value Range Status Comment   MRSA by PCR NEGATIVE  NEGATIVE Final   CULTURE, ROUTINE-ABSCESS     Status: Normal   Collection Time   08/20/12 10:24 AM      Component Value Range Status Comment   Specimen Description PERINEAL   Final    Special Requests NONE   Final    Gram Stain     Final    Value: ABUNDANT WBC PRESENT,BOTH PMN AND MONONUCLEAR     NO SQUAMOUS EPITHELIAL CELLS SEEN     MODERATE GRAM POSITIVE COCCI     IN PAIRS MODERATE GRAM NEGATIVE RODS     FEW GRAM POSITIVE RODS   Culture     Final    Value: GROUP B STREP(S.AGALACTIAE)ISOLATED     Note: TESTING AGAINST S. AGALACTIAE NOT ROUTINELY PERFORMED DUE TO PREDICTABILITY OF AMP/PEN/VAN SUSCEPTIBILITY.   Report Status 08/22/2012 FINAL   Final   ANAEROBIC CULTURE     Status: Normal (Preliminary result)   Collection Time   08/20/12  2:57 PM      Component Value Range Status Comment   Specimen Description PERINEAL   Final    Special Requests NONE   Final    Gram Stain     Final     Value: ABUNDANT WBC PRESENT,BOTH PMN AND MONONUCLEAR     NO SQUAMOUS EPITHELIAL CELLS SEEN     MODERATE GRAM POSITIVE COCCI     IN PAIRS MODERATE GRAM NEGATIVE RODS     FEW GRAM POSITIVE RODS   Culture     Final    Value: NO ANAEROBES ISOLATED; CULTURE IN PROGRESS FOR 5 DAYS   Report Status PENDING   Incomplete   CULTURE, ROUTINE-ABSCESS     Status: Normal (Preliminary result)   Collection Time   08/20/12  3:26 PM      Component Value Range Status Comment   Specimen Description PERITONEAL CAVITY   Final    Special Requests NONE   Final    Gram Stain     Final    Value: RARE WBC PRESENT, PREDOMINANTLY PMN     NO SQUAMOUS EPITHELIAL CELLS SEEN     NO ORGANISMS SEEN   Culture NO GROWTH 2 DAYS   Final    Report Status PENDING   Incomplete   ANAEROBIC CULTURE     Status: Normal (Preliminary result)   Collection Time   08/20/12  3:26 PM      Component Value Range Status Comment   Specimen Description PERITONEAL CAVITY   Final    Special Requests NONE   Final    Gram Stain     Final    Value: RARE WBC PRESENT, PREDOMINANTLY PMN     NO SQUAMOUS EPITHELIAL CELLS SEEN     NO ORGANISMS SEEN   Culture     Final    Value: NO ANAEROBES ISOLATED; CULTURE IN PROGRESS FOR 5 DAYS   Report Status PENDING   Incomplete     Studies/Results: No results found.  Anti-infectives: Anti-infectives     Start     Dose/Rate Route Frequency Ordered Stop   08/20/12 0500   piperacillin-tazobactam (ZOSYN) IVPB 3.375 g        3.375 g 12.5 mL/hr over 240 Minutes Intravenous Every 8 hours 08/19/12 2210     08/19/12 2300   fluconazole (DIFLUCAN) IVPB 400 mg        400 mg 200 mL/hr over 60 Minutes Intravenous Every 24 hours 08/19/12 2145  08/19/12 2100   piperacillin-tazobactam (ZOSYN) IVPB 3.375 g        3.375 g 12.5 mL/hr over 240 Minutes Intravenous  Once 08/19/12 2014 08/20/12 0048   08/19/12 2000   cefTRIAXone (ROCEPHIN) 2 g in dextrose 5 % 50 mL IVPB        2 g 100 mL/hr over 30 Minutes  Intravenous To Emergency Dept 08/19/12 1915 08/19/12 2035          Assessment/Plan: s/p RLQ fluid coll drainages 10/7; would recheck f/u CT this weekend to assess adequacy of drainage   LOS: 4 days    Per Beagley,D [facility] 08/23/2012

## 2012-08-24 ENCOUNTER — Inpatient Hospital Stay (HOSPITAL_COMMUNITY): Payer: Medicaid Other

## 2012-08-24 LAB — GLUCOSE, CAPILLARY
Glucose-Capillary: 113 mg/dL — ABNORMAL HIGH (ref 70–99)
Glucose-Capillary: 124 mg/dL — ABNORMAL HIGH (ref 70–99)
Glucose-Capillary: 127 mg/dL — ABNORMAL HIGH (ref 70–99)
Glucose-Capillary: 85 mg/dL (ref 70–99)
Glucose-Capillary: 97 mg/dL (ref 70–99)

## 2012-08-24 LAB — CBC
MCV: 82.3 fL (ref 78.0–100.0)
Platelets: 419 10*3/uL — ABNORMAL HIGH (ref 150–400)
RBC: 3.22 MIL/uL — ABNORMAL LOW (ref 3.87–5.11)
WBC: 16.5 10*3/uL — ABNORMAL HIGH (ref 4.0–10.5)

## 2012-08-24 LAB — CULTURE, ROUTINE-ABSCESS: Culture: NO GROWTH

## 2012-08-24 LAB — BASIC METABOLIC PANEL
CO2: 19 mEq/L (ref 19–32)
Chloride: 102 mEq/L (ref 96–112)
Creatinine, Ser: 0.85 mg/dL (ref 0.50–1.10)
Potassium: 4.3 mEq/L (ref 3.5–5.1)

## 2012-08-24 MED ORDER — SODIUM CHLORIDE 0.9 % IJ SOLN
10.0000 mL | Freq: Two times a day (BID) | INTRAMUSCULAR | Status: DC
  Administered 2012-08-24 – 2012-09-04 (×8): 10 mL

## 2012-08-24 MED ORDER — SODIUM CHLORIDE 0.9 % IJ SOLN
10.0000 mL | INTRAMUSCULAR | Status: DC | PRN
  Administered 2012-08-31 – 2012-09-03 (×3): 10 mL

## 2012-08-24 MED ORDER — IOHEXOL 300 MG/ML  SOLN
100.0000 mL | Freq: Once | INTRAMUSCULAR | Status: AC | PRN
  Administered 2012-08-24: 100 mL via INTRAVENOUS

## 2012-08-24 NOTE — Progress Notes (Addendum)
Subjective: Pt ok, not feeling much better but no specific c/o this am  Objective: Physical Exam: BP 113/61  Pulse 114  Temp 101 F (38.3 C) (Oral)  Resp 27  Ht 5\' 8"  (1.727 m)  Wt 260 lb 9.3 oz (118.2 kg)  BMI 39.62 kg/m2  SpO2 96%  LMP 08/06/2012 RLQ drains intact #1 with cloudy purulent output #2 with scant serosanguinous   Labs: CBC  Basename 08/24/12 0325 08/23/12 0320  WBC 16.5* 15.9*  HGB 9.0* 8.4*  HCT 26.5* 24.3*  PLT 419* 460*   BMET  Basename 08/24/12 0325 08/23/12 0320  NA 135 134*  K 4.3 4.0  CL 102 103  CO2 19 20  GLUCOSE 97 100*  BUN <3* 3*  CREATININE 0.85 0.87  CALCIUM 8.6 8.4   LFT No results found for this basename: PROT,ALBUMIN,AST,ALT,ALKPHOS,BILITOT,BILIDIR,IBILI,LIPASE in the last 72 hours PT/INR No results found for this basename: LABPROT:2,INR:2 in the last 72 hours   Studies/Results: No results found.  Assessment/Plan: S/p RLQ abscess drains 10/7 Cont to follow Drain #2 with innocent appearing output Follow up CT next few days to reassess    LOS: 5 days    Ascencion Dike PA-C 08/24/2012 8:23 AM  CT done today shows new infrahepatic fluid collection [facility] requests perc drainage NPO after MN Plan for drainage in CT tomorrow 10/12  4:37 PM

## 2012-08-24 NOTE — Progress Notes (Addendum)
Patient ID: [name], female   DOB: 16-Sep-1981, 31 y.o.   MRN: [id number]    Subjective: Pt states that her pain is about the same, although she looks better.    Objective: Vital signs in last 24 hours: Temp:  [98.7 F (37.1 C)-102.9 F (39.4 C)] 98.9 F (37.2 C) (10/11 0800) Pulse Rate:  [109-136] 116  (10/11 0800) Resp:  [22-35] 26  (10/11 0800) BP: (69-119)/(20-74) 107/61 mmHg (10/11 0800) SpO2:  [95 %-100 %] 95 % (10/11 0800) Last BM Date: 08/22/12  Intake/Output from previous day: 10/10 0701 - 10/11 0700 In: 3797.5 [P.O.:150; I.V.:3300; IV Piggyback:337.5] Out: 2395 [Urine:2350; Drains:45] Intake/Output this shift: Total I/O In: 385 [P.O.:60; I.V.:300; IV Piggyback:25] Out: -   PE: Abd: soft, still tender in RLQ, +BS, ND, drain 1 with purulent output, drain 2 with scan serosang output. Ht: tachy Lungs: CTAB  Lab Results:   Basename 08/24/12 0325 08/23/12 0320  WBC 16.5* 15.9*  HGB 9.0* 8.4*  HCT 26.5* 24.3*  PLT 419* 460*   BMET  Basename 08/24/12 0325 08/23/12 0320  NA 135 134*  K 4.3 4.0  CL 102 103  CO2 19 20  GLUCOSE 97 100*  BUN <3* 3*  CREATININE 0.85 0.87  CALCIUM 8.6 8.4   PT/INR No results found for this basename: LABPROT:2,INR:2 in the last 72 hours CMP     Component Value Date/Time   NA 135 08/24/2012 0325   K 4.3 08/24/2012 0325   CL 102 08/24/2012 0325   CO2 19 08/24/2012 0325   GLUCOSE 97 08/24/2012 0325   BUN <3* 08/24/2012 0325   CREATININE 0.85 08/24/2012 0325   CALCIUM 8.6 08/24/2012 0325   PROT 6.6 08/20/2012 0332   ALBUMIN 2.3* 08/20/2012 0332   AST 8 08/20/2012 0332   ALT 10 08/20/2012 0332   ALKPHOS 121* 08/20/2012 0332   BILITOT 0.3 08/20/2012 0332   GFRNONAA >90 08/24/2012 0325   GFRAA >90 08/24/2012 0325   Lipase     Component Value Date/Time   LIPASE 19 08/19/2012 1735       Studies/Results: No results found.  Anti-infectives: Anti-infectives     Start     Dose/Rate Route Frequency Ordered Stop   08/20/12 0500  piperacillin-tazobactam (ZOSYN) IVPB 3.375 g       3.375 g 12.5 mL/hr over 240 Minutes Intravenous Every 8 hours 08/19/12 2210     08/19/12 2300   fluconazole (DIFLUCAN) IVPB 400 mg        400 mg 200 mL/hr over 60 Minutes Intravenous Every 24 hours 08/19/12 2145     08/19/12 2100  piperacillin-tazobactam (ZOSYN) IVPB 3.375 g       3.375 g 12.5 mL/hr over 240 Minutes Intravenous  Once 08/19/12 2014 08/20/12 0048   08/19/12 2000   cefTRIAXone (ROCEPHIN) 2 g in dextrose 5 % 50 mL IVPB        2 g 100 mL/hr over 30 Minutes Intravenous To Emergency Dept 08/19/12 1915 08/19/12 2035           Assessment/Plan  1. Abdominal abscess, s/p perc drain x2, etiology unclear pelvic abscess from ovarian source vs perforated appendicitis  Perc drain on 08/20/2012. 2. Persistent leukocytosis, fever   Plan: 1. Will repeat a CT scan today to evaluate these abscesses.  Patient does look better today, but persistent WBC, fever, and pain despite her perc drain. 2. Appreciate medicine assistance with her medical problems. 3. Cont abx therapy  LOS: 5 days    OSBORNE,KELLY  E 08/24/2012, 10:34 AM Pager: [phone]  I think she is a little better than yesterday, but she just has not progressed as well as I would have thought.  Her WBC has crept up. Plan CT scan today to evaluate process. Records from [facility] on chart and in [place].  Addendum: CT scan shows improvement in drained right pelvic abscess.  But there appear to be two "new" abscesses - one subhepatic, about 7 cm diameter,and one right colonic gutter about 3 cm.  Have schedule perc drain of abscess tomorrow AM.  Patient getting PICC line now. Discussed with patient.  Alphonsa Overall, MD, [facility] Pager: [phone] Office phone:  [phone]

## 2012-08-24 NOTE — Consult Note (Addendum)
[facility] CONSULT FOLLOW-UP NOTE  [name] [id number] DOB: 11-26-1980 DOA: 08/19/2012 PCP: Mack Hook, MD Requesting physician: Michael Boston, MD Attending service: General Surgery Reason for consultation: diabetes, assistance with medical management  Impression/Recommendations: 1. Sepsis--Improving. Secondary to below. Blood cultures pending but negative to date, urine culture as below, intraabdominal fluid culture pending. Has been continuously febrile overnight , hypotension improved. Continue Zosyn, IVF. Management per surgery. Aggressive IVF. 2. Suspected perforated appendicitis--management deferred to general surgery. Agree with empiric Zosyn. Has had perc drains placed by IR. ?timing of repeat CT abdomen. Would favor redoing sooner rather than later given continued fever, leukocytosis and continued complaints of abdominal pain. Will however defer to surgery. 3. DM type 2--Good control on current regimen. 4. UTI--Cx with Klebsiella, sensitive to zosyn. 5 days of treatment should suffice. 5. Normocytic anemia--stable, suspect chronic with possible component of anemia with critical illness. Follow closely, serial CBC, Transfuse <7. Not true iron deficiency given a ferritin of >400. Low serum Fe is indicative of AOCD (chronic inflammation).  6. HTN--hold lisinopril until condition improves. 7. History of PE/DVT 2010--SCDs pending procedure and surgery evaluation.  8. History of bilateral oopherectomy and small bowel resection in 2010 at [facility]  No improvement in acute issues thus far. DM, anemia, HTN, chronic medical issues appear stable. Will continue to follow for now.  Code Status: Full code Family Communication: none present Disposition Plan: per surgery   Domingo Mend, MD [facility] Pager: [phone]   If 8PM-8AM, please contact night-coverage at [email or web address], password [facility] [facility] 08/24/2012, 1:31 PM  LOS: 5 days   Brief narrative: 31 year old  woman with history of 5 days of fever, flank pain, chills, nausea, vomiting, dysuria. CT ab/pelvis suspicious for ruptured appendix with free air in abdomen. Admitted by surgery and medicine consulted for assistance with DM and medical issues.  Procedures:    Antibiotics:  Zosyn 10/6 >>  Fluconazole 10/6 >>  HPI/Subjective:  Still has pain to the RLQ. No n/v.  Objective: Filed Vitals:   08/24/12 0400 08/24/12 0700 08/24/12 0800 08/24/12 1200  BP: 113/61  107/61 125/83  Pulse: 117 114 116 117  Temp: 101 F (38.3 C)  98.9 F (37.2 C) 100.1 F (37.8 C)  TempSrc: Oral  Oral Oral  Resp: 30 27 26 22   Height:      Weight:      SpO2: 95% 96% 95% 92%    Intake/Output Summary (Last 24 hours) at 08/24/12 1331 Last data filed at 08/24/12 1300  Gross per 24 hour  Intake   4245 ml  Output   1795 ml  Net   2450 ml    Exam:  General:  Appears calm, uncomfortable, ill but not toxic Cardiovascular: RRR, no m/r/g. No LE edema. Respiratory: CTA bilaterally, no w/r/r. Normal respiratory effort. tachypneic. Psychiatric: grossly normal mood and affect, speech fluent and appropriate  Data Reviewed: Basic Metabolic Panel:  Lab [id number] 0325 08/23/12 0320 08/22/12 0320 08/21/12 0317 08/20/12 0332  NA 135 134* 137 136 135  K 4.3 4.0 3.8 3.6 3.5  CL 102 103 105 104 101  CO2 19 20 18* 18* 22  GLUCOSE 97 100* 118* 179* 238*  BUN <3* 3* 5* 7 7  CREATININE 0.85 0.87 0.94 1.11* 0.92  CALCIUM 8.6 8.4 8.4 8.1* 8.0*  MG -- -- -- -- --  PHOS -- -- -- -- --   Liver Function Tests:  Lab 08/20/12 0332 08/19/12 1735  AST 8 9  ALT 10 14  ALKPHOS 121* 168*  BILITOT 0.3 0.3  PROT 6.6 8.6*  ALBUMIN 2.3* 2.8*    Lab 08/19/12 1735  LIPASE 19  AMYLASE --   CBC:  Lab 08/24/12 0325 08/23/12 0320 08/22/12 0320 08/21/12 0317 08/20/12 1055 08/20/12 0332 08/19/12 1735  WBC 16.5* 15.9* 17.7* 17.6* 23.5* -- --  NEUTROABS -- 13.2* -- 14.8* -- 16.6* 16.2*  HGB 9.0* 8.4* 8.5* 8.5* 9.3* -- --    HCT 26.5* 24.3* 24.9* 24.8* 27.0* -- --  MCV 82.3 82.4 82.7 83.5 82.8 -- --  PLT 419* 460* 410* 389 452* -- --   CBG:  Lab 08/24/12 1142 08/24/12 0837 08/24/12 0312 08/24/12 0017 08/23/12 2046  GLUCAP 93 130* 97 85 131*    Recent Results (from the past 240 hour(s))  URINE CULTURE     Status: Normal   Collection Time   08/19/12  5:49 PM      Component Value Range Status Comment   Specimen Description URINE, CATHETERIZED   Final    Special Requests NONE   Final    Culture  Setup Time 08/20/2012 12:23   Final    Colony Count >=100,000 COLONIES/ML   Final    Culture KLEBSIELLA PNEUMONIAE   Final    Report Status 08/22/2012 FINAL   Final    Organism ID, Bacteria KLEBSIELLA PNEUMONIAE   Final   CULTURE, BLOOD (ROUTINE X 2)     Status: Normal (Preliminary result)   Collection Time   08/19/12 11:00 PM      Component Value Range Status Comment   Specimen Description BLOOD RIGHT ANTECUBITAL   Final    Special Requests BOTTLES DRAWN AEROBIC ONLY 3CC   Final    Culture  Setup Time 08/20/2012 12:19   Final    Culture     Final    Value:        BLOOD CULTURE RECEIVED NO GROWTH TO DATE CULTURE WILL BE HELD FOR 5 DAYS BEFORE ISSUING A FINAL NEGATIVE REPORT   Report Status PENDING   Incomplete   CULTURE, BLOOD (ROUTINE X 2)     Status: Normal (Preliminary result)   Collection Time   08/19/12 11:15 PM      Component Value Range Status Comment   Specimen Description BLOOD LEFT ANTECUBITAL   Final    Special Requests BOTTLES DRAWN AEROBIC AND ANAEROBIC 5CC   Final    Culture  Setup Time 08/20/2012 12:19   Final    Culture     Final    Value:        BLOOD CULTURE RECEIVED NO GROWTH TO DATE CULTURE WILL BE HELD FOR 5 DAYS BEFORE ISSUING A FINAL NEGATIVE REPORT   Report Status PENDING   Incomplete   MRSA PCR SCREENING     Status: Normal   Collection Time   08/20/12 12:22 AM      Component Value Range Status Comment   MRSA by PCR NEGATIVE  NEGATIVE Final   CULTURE, ROUTINE-ABSCESS     Status:  Normal   Collection Time   08/20/12 10:24 AM      Component Value Range Status Comment   Specimen Description PERINEAL   Final    Special Requests NONE   Final    Gram Stain     Final    Value: ABUNDANT WBC PRESENT,BOTH PMN AND MONONUCLEAR     NO SQUAMOUS EPITHELIAL CELLS SEEN     MODERATE GRAM POSITIVE COCCI     IN PAIRS MODERATE GRAM NEGATIVE RODS  FEW GRAM POSITIVE RODS   Culture     Final    Value: GROUP B STREP(S.AGALACTIAE)ISOLATED     Note: TESTING AGAINST S. AGALACTIAE NOT ROUTINELY PERFORMED DUE TO PREDICTABILITY OF AMP/PEN/VAN SUSCEPTIBILITY.   Report Status 08/22/2012 FINAL   Final   ANAEROBIC CULTURE     Status: Normal (Preliminary result)   Collection Time   08/20/12  2:57 PM      Component Value Range Status Comment   Specimen Description PERINEAL   Final    Special Requests NONE   Final    Gram Stain     Final    Value: ABUNDANT WBC PRESENT,BOTH PMN AND MONONUCLEAR     NO SQUAMOUS EPITHELIAL CELLS SEEN     MODERATE GRAM POSITIVE COCCI     IN PAIRS MODERATE GRAM NEGATIVE RODS     FEW GRAM POSITIVE RODS   Culture     Final    Value: NO ANAEROBES ISOLATED; CULTURE IN PROGRESS FOR 5 DAYS   Report Status PENDING   Incomplete   CULTURE, ROUTINE-ABSCESS     Status: Normal   Collection Time   08/20/12  3:26 PM      Component Value Range Status Comment   Specimen Description PERITONEAL CAVITY   Final    Special Requests NONE   Final    Gram Stain     Final    Value: RARE WBC PRESENT, PREDOMINANTLY PMN     NO SQUAMOUS EPITHELIAL CELLS SEEN     NO ORGANISMS SEEN   Culture NO GROWTH 3 DAYS   Final    Report Status 08/24/2012 FINAL   Final   ANAEROBIC CULTURE     Status: Normal (Preliminary result)   Collection Time   08/20/12  3:26 PM      Component Value Range Status Comment   Specimen Description PERITONEAL CAVITY   Final    Special Requests NONE   Final    Gram Stain     Final    Value: RARE WBC PRESENT, PREDOMINANTLY PMN     NO SQUAMOUS EPITHELIAL CELLS SEEN       NO ORGANISMS SEEN   Culture     Final    Value: NO ANAEROBES ISOLATED; CULTURE IN PROGRESS FOR 5 DAYS   Report Status PENDING   Incomplete      Studies: No results found. Scheduled Meds:    . antiseptic oral rinse  15 mL Mouth Rinse q12n4p  . chlorhexidine  15 mL Mouth Rinse BID  . citalopram  20 mg Oral Daily  . enoxaparin (LOVENOX) injection  40 mg Subcutaneous Daily  . ferrous sulfate  325 mg Oral TID WC  . fluconazole (DIFLUCAN) IV  400 mg Intravenous Q24H  . insulin aspart  0-20 Units Subcutaneous Q4H  . insulin aspart  0-5 Units Subcutaneous QHS  . insulin glargine  15 Units Subcutaneous QHS  . lip balm  1 application Topical BID  . piperacillin-tazobactam (ZOSYN)  IV  3.375 g Intravenous Q8H   Continuous Infusions:    . 0.45 % NaCl with KCl 20 mEq / L 150 mL/hr at 08/24/12 0900     Active Problems:  DIABETES MELLITUS, TYPE II, UNCONTROLLED  URINALYSIS, ABNORMAL  Obesity (BMI 30-39.9)  Anxiety  Depression  Noncompliance to therapies / medications  Sepsis  CKD (chronic kidney disease)  UTI (lower urinary tract infection)  Anemia  HTN (hypertension)    Time Spent: 25 minutes  Domingo Mend, MD [facility] Pager: [phone]  If 8PM-8AM, please contact night-coverage at [email or web address], password [facility] 08/24/2012, 1:31 PM  LOS: 5 days

## 2012-08-24 NOTE — Progress Notes (Signed)
Peripherally Inserted Central Catheter/Midline Placement  The IV Nurse has discussed with the patient and/or persons authorized to consent for the patient, the purpose of this procedure and the potential benefits and risks involved with this procedure.  The benefits include less needle sticks, lab draws from the catheter and patient may be discharged home with the catheter.  Risks include, but not limited to, infection, bleeding, blood clot (thrombus formation), and puncture of an artery; nerve damage and irregular heat beat.  Alternatives to this procedure were also discussed.  PICC/Midline Placement Documentation        [name] 08/24/2012, 4:55 PM

## 2012-08-25 ENCOUNTER — Inpatient Hospital Stay (HOSPITAL_COMMUNITY): Payer: Medicaid Other

## 2012-08-25 LAB — BASIC METABOLIC PANEL
BUN: <3 mg/dL — ABNORMAL LOW (ref 6–23)
CO2: 22 mEq/L (ref 19–32)
Chloride: 98 mEq/L (ref 96–112)
Creatinine, Ser: 0.87 mg/dL (ref 0.50–1.10)
GFR calc Af Amer: >90 mL/min (ref >90)
Potassium: 3.6 mEq/L (ref 3.5–5.1)

## 2012-08-25 LAB — GLUCOSE, CAPILLARY
Glucose-Capillary: 95 mg/dL (ref 70–99)
Glucose-Capillary: 116 mg/dL — ABNORMAL HIGH (ref 70–99)

## 2012-08-25 LAB — CBC
HCT: 20.2 % — ABNORMAL LOW (ref 36.0–46.0)
Hemoglobin: 6.9 g/dL — CL (ref 12.0–15.0)
MCHC: 34.5 g/dL (ref 30.0–36.0)
MCV: 82.4 fL (ref 78.0–100.0)
Platelets: 516 10*3/uL — ABNORMAL HIGH (ref 150–400)
RBC: 2.45 MIL/uL — ABNORMAL LOW (ref 3.87–5.11)
RDW: 13.7 % (ref 11.5–15.5)
WBC: 18 10*3/uL — ABNORMAL HIGH (ref 4.0–10.5)
WBC: 18 10*3/uL — ABNORMAL HIGH (ref 4.0–10.5)

## 2012-08-25 MED ORDER — MIDAZOLAM HCL 2 MG/2ML IJ SOLN
INTRAMUSCULAR | Status: AC | PRN
  Administered 2012-08-25 (×2): 2 mg via INTRAVENOUS

## 2012-08-25 MED ORDER — FENTANYL CITRATE 0.05 MG/ML IJ SOLN
INTRAMUSCULAR | Status: AC | PRN
  Administered 2012-08-25 (×3): 100 ug via INTRAVENOUS

## 2012-08-25 NOTE — Progress Notes (Signed)
CRITICAL VALUE ALERT  Critical value received:  Hgb 6.9  Date of notification:  08/25/12  Time of notification:  0543  Critical value read back:yes  Nurse who received alert:  M. Jilda Kress  MD notified (1st page):  Dr. Zella Richer  Time of first page:  [phone]  MD notified (2nd page):  Time of second page:  Responding MD:  T. Rosenbower  Time MD responded:  [phone]

## 2012-08-25 NOTE — Progress Notes (Signed)
ANTIBIOTIC CONSULT NOTE - Follow up  Pharmacy Consult for Zosyn Indication: abdominal abscess, UTI  No Known Allergies  Patient Measurements: Height: 5\' 8"  (172.7 cm) Weight: 201 lb 11.5 oz (91.5 kg) IBW/kg (Calculated) : 63.9   Vital Signs: Temp: 98.8 F (37.1 C) (10/12 1200) Temp src: Oral (10/12 1200) BP: 108/53 mmHg (10/12 1400) Pulse Rate: 110  (10/12 1400) Intake/Output from previous day: 10/11 0701 - 10/12 0700 In: 3965 [P.O.:420; I.V.:3150; IV Piggyback:375] Out: 2270 [Urine:2250; Drains:20]  Labs:  Basename 08/25/12 0604 08/25/12 0343 08/24/12 0325 08/23/12 0320  WBC 18.0* 18.0* 16.5* --  HGB 7.0* 6.9* 9.0* --  PLT 516* 532* 419* --  LABCREA -- -- -- --  CREATININE -- 0.87 0.85 0.87   Estimated Creatinine Clearance: 110.8 ml/min (by C-G formula based on Cr of 0.87). No results found for this basename: VANCOTROUGH:2,VANCOPEAK:2,VANCORANDOM:2,GENTTROUGH:2,GENTPEAK:2,GENTRANDOM:2,TOBRATROUGH:2,TOBRAPEAK:2,TOBRARND:2,AMIKACINPEAK:2,AMIKACINTROU:2,AMIKACIN:2, in the last 72 hours   Microbiology: Recent Results (from the past 720 hour(s))  URINE CULTURE     Status: Normal   Collection Time   08/19/12  5:49 PM      Component Value Range Status Comment   Specimen Description URINE, CATHETERIZED   Final    Special Requests NONE   Final    Culture  Setup Time 08/20/2012 12:23   Final    Colony Count >=100,000 COLONIES/ML   Final    Culture KLEBSIELLA PNEUMONIAE   Final    Report Status 08/22/2012 FINAL   Final    Organism ID, Bacteria KLEBSIELLA PNEUMONIAE   Final   CULTURE, BLOOD (ROUTINE X 2)     Status: Normal (Preliminary result)   Collection Time   08/19/12 11:00 PM      Component Value Range Status Comment   Specimen Description BLOOD RIGHT ANTECUBITAL   Final    Special Requests BOTTLES DRAWN AEROBIC ONLY 3CC   Final    Culture  Setup Time 08/20/2012 12:19   Final    Culture     Final    Value:        BLOOD CULTURE RECEIVED NO GROWTH TO DATE CULTURE WILL BE  HELD FOR 5 DAYS BEFORE ISSUING A FINAL NEGATIVE REPORT   Report Status PENDING   Incomplete   CULTURE, BLOOD (ROUTINE X 2)     Status: Normal (Preliminary result)   Collection Time   08/19/12 11:15 PM      Component Value Range Status Comment   Specimen Description BLOOD LEFT ANTECUBITAL   Final    Special Requests BOTTLES DRAWN AEROBIC AND ANAEROBIC 5CC   Final    Culture  Setup Time 08/20/2012 12:19   Final    Culture     Final    Value:        BLOOD CULTURE RECEIVED NO GROWTH TO DATE CULTURE WILL BE HELD FOR 5 DAYS BEFORE ISSUING A FINAL NEGATIVE REPORT   Report Status PENDING   Incomplete   MRSA PCR SCREENING     Status: Normal   Collection Time   08/20/12 12:22 AM      Component Value Range Status Comment   MRSA by PCR NEGATIVE  NEGATIVE Final   CULTURE, ROUTINE-ABSCESS     Status: Normal   Collection Time   08/20/12 10:24 AM      Component Value Range Status Comment   Specimen Description PERINEAL   Final    Special Requests NONE   Final    Gram Stain     Final    Value: ABUNDANT WBC PRESENT,BOTH  PMN AND MONONUCLEAR     NO SQUAMOUS EPITHELIAL CELLS SEEN     MODERATE GRAM POSITIVE COCCI     IN PAIRS MODERATE GRAM NEGATIVE RODS     FEW GRAM POSITIVE RODS   Culture     Final    Value: GROUP B STREP(S.AGALACTIAE)ISOLATED     Note: TESTING AGAINST S. AGALACTIAE NOT ROUTINELY PERFORMED DUE TO PREDICTABILITY OF AMP/PEN/VAN SUSCEPTIBILITY.   Report Status 08/22/2012 FINAL   Final   ANAEROBIC CULTURE     Status: Normal (Preliminary result)   Collection Time   08/20/12  2:57 PM      Component Value Range Status Comment   Specimen Description PERINEAL   Final    Special Requests NONE   Final    Gram Stain     Final    Value: ABUNDANT WBC PRESENT,BOTH PMN AND MONONUCLEAR     NO SQUAMOUS EPITHELIAL CELLS SEEN     MODERATE GRAM POSITIVE COCCI     IN PAIRS MODERATE GRAM NEGATIVE RODS     FEW GRAM POSITIVE RODS   Culture     Final    Value: NO ANAEROBES ISOLATED; CULTURE IN PROGRESS  FOR 5 DAYS   Report Status PENDING   Incomplete   CULTURE, ROUTINE-ABSCESS     Status: Normal   Collection Time   08/20/12  3:26 PM      Component Value Range Status Comment   Specimen Description PERITONEAL CAVITY   Final    Special Requests NONE   Final    Gram Stain     Final    Value: RARE WBC PRESENT, PREDOMINANTLY PMN     NO SQUAMOUS EPITHELIAL CELLS SEEN     NO ORGANISMS SEEN   Culture NO GROWTH 3 DAYS   Final    Report Status 08/24/2012 FINAL   Final   ANAEROBIC CULTURE     Status: Normal   Collection Time   08/20/12  3:26 PM      Component Value Range Status Comment   Specimen Description PERITONEAL CAVITY   Final    Special Requests NONE   Final    Gram Stain     Final    Value: RARE WBC PRESENT, PREDOMINANTLY PMN     NO SQUAMOUS EPITHELIAL CELLS SEEN     NO ORGANISMS SEEN   Culture NO ANAEROBES ISOLATED   Final    Report Status 08/25/2012 FINAL   Final   CULTURE, BLOOD (ROUTINE X 2)     Status: Normal (Preliminary result)   Collection Time   08/24/12  4:45 PM      Component Value Range Status Comment   Specimen Description Blood   Final    Special Requests Normal   Final    Culture  Setup Time 08/24/2012 22:50   Final    Culture     Final    Value:        BLOOD CULTURE RECEIVED NO GROWTH TO DATE CULTURE WILL BE HELD FOR 5 DAYS BEFORE ISSUING A FINAL NEGATIVE REPORT   Report Status PENDING   Incomplete      Medications:  Anti-infectives     Start     Dose/Rate Route Frequency Ordered Stop   08/20/12 0500  piperacillin-tazobactam (ZOSYN) IVPB 3.375 g       3.375 g 12.5 mL/hr over 240 Minutes Intravenous Every 8 hours 08/19/12 2210     08/19/12 2300   fluconazole (DIFLUCAN) IVPB 400 mg  400 mg 200 mL/hr over 60 Minutes Intravenous Every 24 hours 08/19/12 2145     08/19/12 2100  piperacillin-tazobactam (ZOSYN) IVPB 3.375 g       3.375 g 12.5 mL/hr over 240 Minutes Intravenous  Once 08/19/12 2014 08/20/12 0048   08/19/12 2000   cefTRIAXone (ROCEPHIN) 2  g in dextrose 5 % 50 mL IVPB        2 g 100 mL/hr over 30 Minutes Intravenous To Emergency Dept 08/19/12 1915 08/19/12 2035          Assessment:  48 YOF admit 10/6 with abdominal abscesses s/p drain 10/12, UTI.  D#7 Zosyn 3.375g IV q8h and fluconazole 400mg  IV q24h  Peritoneal culture = Group B Strep.  Urine culture = Klebsiella (Sens to cefazolin, ceftriaxone, cipro, gent, levo, zosyn, bactrim)  WBC still grossly elevated, Tm 103, Scr stable/wnl, CrCl >100 ml/min  Plan:   Continue Zosyn 3.375g IV Q8H infused over 4hrs.  Continue to monitor and adjust if necessary  Duration of therapy per MD   Marcell Anger PharmD  [phone] 08/25/2012 2:48 PM

## 2012-08-25 NOTE — Progress Notes (Addendum)
Subjective: Some pain in RLQ and RUQ.  Objective: Vital signs in last 24 hours: Temp:  [98.9 F (37.2 C)-103 F (39.4 C)] 98.9 F (37.2 C) (10/12 0800) Pulse Rate:  [111-121] 117  (10/12 0400) Resp:  [22-34] 34  (10/12 0400) BP: (102-125)/(55-83) 117/55 mmHg (10/12 0400) SpO2:  [91 %-97 %] 91 % (10/12 0400) Weight:  [201 lb 11.5 oz (91.5 kg)] 201 lb 11.5 oz (91.5 kg) (10/12 0400) Last BM Date: 08/24/12  Intake/Output from previous day: 10/11 0701 - 10/12 0700 In: 3802.5 [P.O.:420; I.V.:3000; IV Piggyback:362.5] Out: 2270 [Urine:2250; Drains:20] Intake/Output this shift:    PE: Abd-soft, obese, drain output light brown and thin, mild tenderness in RLQ and RUQ  Lab Results:   [facility] 08/25/12 0604 08/25/12 0343  WBC 18.0* 18.0*  HGB 7.0* 6.9*  HCT 20.0* 20.2*  PLT 516* 532*   BMET  Basename 08/25/12 0343 08/24/12 0325  NA 132* 135  K 3.6 4.3  CL 98 102  CO2 22 19  GLUCOSE 127* 97  BUN <3* <3*  CREATININE 0.87 0.85  CALCIUM 8.5 8.6   PT/INR No results found for this basename: LABPROT:2,INR:2 in the last 72 hours Comprehensive Metabolic Panel:    Component Value Date/Time   NA 132* 08/25/2012 0343   K 3.6 08/25/2012 0343   CL 98 08/25/2012 0343   CO2 22 08/25/2012 0343   BUN <3* 08/25/2012 0343   CREATININE 0.87 08/25/2012 0343   GLUCOSE 127* 08/25/2012 0343   CALCIUM 8.5 08/25/2012 0343   AST 8 08/20/2012 0332   ALT 10 08/20/2012 0332   ALKPHOS 121* 08/20/2012 0332   BILITOT 0.3 08/20/2012 0332   PROT 6.6 08/20/2012 0332   ALBUMIN 2.3* 08/20/2012 0332     Studies/Results: Ct Abdomen Pelvis W Contrast  08/24/2012  *RADIOLOGY REPORT*  Clinical Data: Percutaneous abscess drainage 08/20/2012.  Evaluate for possible drain removal.  CT ABDOMEN AND PELVIS WITH CONTRAST  Technique:  Multidetector CT imaging of the abdomen and pelvis was performed following the standard protocol during bolus administration of intravenous contrast.  Contrast: 167mL OMNIPAQUE  IOHEXOL 300 MG/ML  SOLN  Comparison: CT 08/19/2012 and 08/20/2012.  Findings: There is a new small right pleural effusion with worsened right greater than left basilar atelectasis.  The two right-sided percutaneous drains in the false pelvis are stable in position.  The complex pelvic fluid collection has been partially drained.  The fluid collection along the superior right aspect of the uterus is unchanged, measuring up to 4.6 cm transverse on image 82.  Collection immediately superior to the superior drain and appearing to communicate with it measures up to 6.1 cm on image 73.  There is a collection lateral to the cecum which contains air bubbles and measures 4.7 cm on image 70.  There is a new lenticular shaped fluid collection along the inferior aspect of the right hepatic lobe, measuring 9.3 x 6.9 cm transverse on image 53.  Edema within the base of the mesentery has increased.  There is also increased subcutaneous edema.  There is no generalized ascites.  The liver, spleen, gallbladder, pancreas, adrenal glands and kidneys appear unremarkable.  There is no hydronephrosis. Retroperitoneal and mesenteric lymph nodes are likely reactive. There is a stable small fluid collection wrapping around the posterior and right aspect of the urethra on images 105 - 108 which could reflect a urethral diverticulum.  IMPRESSION:  1.  Status post percutaneous drainage of complex pelvic fluid collection.  The fluid collections immediately surrounding  the drainage catheters have decreased in size. 2.  There are several residual fluid collections, including some which have enlarged in the right lower quadrant.  There is a prominent fluid collection along the inferior right hepatic lobe, measuring up to 9.3 cm in diameter. 3.  Increased mesenteric and subcutaneous edema with new small right pleural effusion.  Increased bibasilar atelectasis.   Original Report Authenticated By: Vivia Ewing, M.D.      Anti-infectives: Anti-infectives     Start     Dose/Rate Route Frequency Ordered Stop   08/20/12 0500  piperacillin-tazobactam (ZOSYN) IVPB 3.375 g       3.375 g 12.5 mL/hr over 240 Minutes Intravenous Every 8 hours 08/19/12 2210     08/19/12 2300   fluconazole (DIFLUCAN) IVPB 400 mg        400 mg 200 mL/hr over 60 Minutes Intravenous Every 24 hours 08/19/12 2145     08/19/12 2100  piperacillin-tazobactam (ZOSYN) IVPB 3.375 g       3.375 g 12.5 mL/hr over 240 Minutes Intravenous  Once 08/19/12 2014 08/20/12 0048   08/19/12 2000   cefTRIAXone (ROCEPHIN) 2 g in dextrose 5 % 50 mL IVPB        2 g 100 mL/hr over 30 Minutes Intravenous To Emergency Dept 08/19/12 1915 08/19/12 2035          Assessment Active Problems:  Intraabdominal abscesses s/p percutaneous drainage-more abscesses are present on yesterday's CT and percutaneous drainage has been scheduled for today.  Etiology of the abscesses remains unclear.  This has been and ongoing type of problem every since her ovarian cystectomy and small bowel resection June 2013 at [facility].  DIABETES MELLITUS, TYPE II, UNCONTROLLED  URINALYSIS, ABNORMAL  Obesity (BMI 30-39.9)  Anxiety  Depression  Noncompliance to therapies / medications  Sepsis  CKD (chronic kidney disease)  UTI (lower urinary tract infection)  Anemia-hgb down to 7; no obvious bleeding source; will hold on transfusion and repeat tomorrow.  HTN (hypertension)    LOS: 6 days   Plan: Percutaneous drainage of new abscess today.   Kierstan Auer J 08/25/2012

## 2012-08-25 NOTE — Consult Note (Signed)
[facility] CONSULT FOLLOW-UP NOTE  [name] [id number] DOB: 11/10/1981 DOA: 08/19/2012 PCP: Mack Hook, MD Requesting physician: Michael Boston, MD Attending service: General Surgery Reason for consultation: diabetes, assistance with medical management  Impression/Recommendations: 1. Sepsis--Improving. Secondary to below. Blood cultures pending but negative to date, urine culture as below, intraabdominal fluid culture pending. Has been continuously febrile overnight , hypotension improved. Continue Zosyn, IVF. Management per surgery. Aggressive IVF. 2. Suspected perforated appendicitis/Multiple Pelvic Abscesses--management deferred to general surgery. Agree with empiric Zosyn. Has had perc drains placed by IR. Repeat CT with 2 new abscesses. Appears plan is for another perc drain today. 3. DM type 2--Good control on current regimen. 4. UTI--Cx with Klebsiella, sensitive to zosyn. 5 days of treatment should suffice. 5. Normocytic anemia--Hb today 6.9, confirmed at 7.0. Blood transfusion is indicated in all critically ill in the ICU with HB <7 regardless of age. Will order transfusion of 1 u of PRBCs for now. Follow serial CBCs.  Not true iron deficiency given a ferritin of >400. Low serum Fe is indicative of AOCD (chronic inflammation). Probably has a component of dilution given aggressive IVF regimen. 6. HTN--hold lisinopril until condition improves. BP has been stable. 7. History of PE/DVT 2010--SCDs pending procedure and surgery evaluation.  8. History of bilateral oopherectomy and small bowel resection in 2010 at [facility]  No improvement in acute issues thus far. DM, anemia, HTN, chronic medical issues appear stable. Will continue to follow for now.  Code Status: Full code Family Communication: none present Disposition Plan: per surgery   Domingo Mend, MD [facility] Pager: [phone]   If 8PM-8AM, please contact night-coverage at [email or web address], password  [facility] 08/25/2012, 11:07 AM  LOS: 6 days   Brief narrative: 31 year old woman with history of 5 days of fever, flank pain, chills, nausea, vomiting, dysuria. CT ab/pelvis suspicious for ruptured appendix with free air in abdomen. Admitted by surgery and medicine consulted for assistance with DM and medical issues.  Procedures:    Antibiotics:  Zosyn 10/6 >>  Fluconazole 10/6 >>  HPI/Subjective:  Still has pain to the RLQ. No n/v.  Objective: Filed Vitals:   08/24/12 2000 08/25/12 0000 08/25/12 0400 08/25/12 0800  BP: 102/64 109/56 117/55 116/56  Pulse: 111 121 117 106  Temp: 99.6 F (37.6 C) 99.5 F (37.5 C) 101.8 F (38.8 C) 98.9 F (37.2 C)  TempSrc: Oral Oral Oral Oral  Resp: 28 31 34 24  Height:      Weight:   91.5 kg (201 lb 11.5 oz)   SpO2: 94% 97% 91% 92%    Intake/Output Summary (Last 24 hours) at 08/25/12 1107 Last data filed at 08/25/12 1000  Gross per 24 hour  Intake   3720 ml  Output   2040 ml  Net   1680 ml    Exam:  General:  Appears calm, uncomfortable, ill but not toxic Cardiovascular: RRR, no m/r/g. No LE edema. Respiratory: CTA bilaterally, no w/r/r. Normal respiratory effort. tachypneic. Psychiatric: grossly normal mood and affect, speech fluent and appropriate  Data Reviewed: Basic Metabolic Panel:  Lab [id number] 08/24/12 0325 08/23/12 0320 08/22/12 0320 08/21/12 0317  NA 132* 135 134* 137 136  K 3.6 4.3 4.0 3.8 3.6  CL 98 102 103 105 104  CO2 22 19 20  18* 18*  GLUCOSE 127* 97 100* 118* 179*  BUN <3* <3* 3* 5* 7  CREATININE 0.87 0.85 0.87 0.94 1.11*  CALCIUM 8.5 8.6 8.4 8.4 8.1*  MG -- -- -- -- --  PHOS -- -- -- -- --   Liver Function Tests:  Lab 08/20/12 0332 08/19/12 1735  AST 8 9  ALT 10 14  ALKPHOS 121* 168*  BILITOT 0.3 0.3  PROT 6.6 8.6*  ALBUMIN 2.3* 2.8*    Lab 08/19/12 1735  LIPASE 19  AMYLASE --   CBC:  Lab 08/25/12 0604 08/25/12 0343 08/24/12 0325 08/23/12 0320 08/22/12 0320 08/21/12 0317 08/20/12 0332  08/19/12 1735  WBC 18.0* 18.0* 16.5* 15.9* 17.7* -- -- --  NEUTROABS -- -- -- 13.2* -- 14.8* 16.6* 16.2*  HGB 7.0* 6.9* 9.0* 8.4* 8.5* -- -- --  HCT 20.0* 20.2* 26.5* 24.3* 24.9* -- -- --  MCV 82.6 82.4 82.3 82.4 82.7 -- -- --  PLT 516* 532* 419* 460* 410* -- -- --   CBG:  Lab 08/25/12 0747 08/25/12 0329 08/24/12 2356 08/24/12 1928 08/24/12 1706  GLUCAP 95 126* 127* 113* 124*    Recent Results (from the past 240 hour(s))  URINE CULTURE     Status: Normal   Collection Time   08/19/12  5:49 PM      Component Value Range Status Comment   Specimen Description URINE, CATHETERIZED   Final    Special Requests NONE   Final    Culture  Setup Time 08/20/2012 12:23   Final    Colony Count >=100,000 COLONIES/ML   Final    Culture KLEBSIELLA PNEUMONIAE   Final    Report Status 08/22/2012 FINAL   Final    Organism ID, Bacteria KLEBSIELLA PNEUMONIAE   Final   CULTURE, BLOOD (ROUTINE X 2)     Status: Normal (Preliminary result)   Collection Time   08/19/12 11:00 PM      Component Value Range Status Comment   Specimen Description BLOOD RIGHT ANTECUBITAL   Final    Special Requests BOTTLES DRAWN AEROBIC ONLY 3CC   Final    Culture  Setup Time 08/20/2012 12:19   Final    Culture     Final    Value:        BLOOD CULTURE RECEIVED NO GROWTH TO DATE CULTURE WILL BE HELD FOR 5 DAYS BEFORE ISSUING A FINAL NEGATIVE REPORT   Report Status PENDING   Incomplete   CULTURE, BLOOD (ROUTINE X 2)     Status: Normal (Preliminary result)   Collection Time   08/19/12 11:15 PM      Component Value Range Status Comment   Specimen Description BLOOD LEFT ANTECUBITAL   Final    Special Requests BOTTLES DRAWN AEROBIC AND ANAEROBIC 5CC   Final    Culture  Setup Time 08/20/2012 12:19   Final    Culture     Final    Value:        BLOOD CULTURE RECEIVED NO GROWTH TO DATE CULTURE WILL BE HELD FOR 5 DAYS BEFORE ISSUING A FINAL NEGATIVE REPORT   Report Status PENDING   Incomplete   MRSA PCR SCREENING     Status: Normal    Collection Time   08/20/12 12:22 AM      Component Value Range Status Comment   MRSA by PCR NEGATIVE  NEGATIVE Final   CULTURE, ROUTINE-ABSCESS     Status: Normal   Collection Time   08/20/12 10:24 AM      Component Value Range Status Comment   Specimen Description PERINEAL   Final    Special Requests NONE   Final    Gram Stain     Final  Value: ABUNDANT WBC PRESENT,BOTH PMN AND MONONUCLEAR     NO SQUAMOUS EPITHELIAL CELLS SEEN     MODERATE GRAM POSITIVE COCCI     IN PAIRS MODERATE GRAM NEGATIVE RODS     FEW GRAM POSITIVE RODS   Culture     Final    Value: GROUP B STREP(S.AGALACTIAE)ISOLATED     Note: TESTING AGAINST S. AGALACTIAE NOT ROUTINELY PERFORMED DUE TO PREDICTABILITY OF AMP/PEN/VAN SUSCEPTIBILITY.   Report Status 08/22/2012 FINAL   Final   ANAEROBIC CULTURE     Status: Normal (Preliminary result)   Collection Time   08/20/12  2:57 PM      Component Value Range Status Comment   Specimen Description PERINEAL   Final    Special Requests NONE   Final    Gram Stain     Final    Value: ABUNDANT WBC PRESENT,BOTH PMN AND MONONUCLEAR     NO SQUAMOUS EPITHELIAL CELLS SEEN     MODERATE GRAM POSITIVE COCCI     IN PAIRS MODERATE GRAM NEGATIVE RODS     FEW GRAM POSITIVE RODS   Culture     Final    Value: NO ANAEROBES ISOLATED; CULTURE IN PROGRESS FOR 5 DAYS   Report Status PENDING   Incomplete   CULTURE, ROUTINE-ABSCESS     Status: Normal   Collection Time   08/20/12  3:26 PM      Component Value Range Status Comment   Specimen Description PERITONEAL CAVITY   Final    Special Requests NONE   Final    Gram Stain     Final    Value: RARE WBC PRESENT, PREDOMINANTLY PMN     NO SQUAMOUS EPITHELIAL CELLS SEEN     NO ORGANISMS SEEN   Culture NO GROWTH 3 DAYS   Final    Report Status 08/24/2012 FINAL   Final   ANAEROBIC CULTURE     Status: Normal (Preliminary result)   Collection Time   08/20/12  3:26 PM      Component Value Range Status Comment   Specimen Description PERITONEAL  CAVITY   Final    Special Requests NONE   Final    Gram Stain     Final    Value: RARE WBC PRESENT, PREDOMINANTLY PMN     NO SQUAMOUS EPITHELIAL CELLS SEEN     NO ORGANISMS SEEN   Culture     Final    Value: NO ANAEROBES ISOLATED; CULTURE IN PROGRESS FOR 5 DAYS   Report Status PENDING   Incomplete      Studies: Ct Abdomen Pelvis W Contrast  08/24/2012  *RADIOLOGY REPORT*  Clinical Data: Percutaneous abscess drainage 08/20/2012.  Evaluate for possible drain removal.  CT ABDOMEN AND PELVIS WITH CONTRAST  Technique:  Multidetector CT imaging of the abdomen and pelvis was performed following the standard protocol during bolus administration of intravenous contrast.  Contrast: 149mL OMNIPAQUE IOHEXOL 300 MG/ML  SOLN  Comparison: CT 08/19/2012 and 08/20/2012.  Findings: There is a new small right pleural effusion with worsened right greater than left basilar atelectasis.  The two right-sided percutaneous drains in the false pelvis are stable in position.  The complex pelvic fluid collection has been partially drained.  The fluid collection along the superior right aspect of the uterus is unchanged, measuring up to 4.6 cm transverse on image 82.  Collection immediately superior to the superior drain and appearing to communicate with it measures up to 6.1 cm on image 73.  There is a  collection lateral to the cecum which contains air bubbles and measures 4.7 cm on image 70.  There is a new lenticular shaped fluid collection along the inferior aspect of the right hepatic lobe, measuring 9.3 x 6.9 cm transverse on image 53.  Edema within the base of the mesentery has increased.  There is also increased subcutaneous edema.  There is no generalized ascites.  The liver, spleen, gallbladder, pancreas, adrenal glands and kidneys appear unremarkable.  There is no hydronephrosis. Retroperitoneal and mesenteric lymph nodes are likely reactive. There is a stable small fluid collection wrapping around the posterior and  right aspect of the urethra on images 105 - 108 which could reflect a urethral diverticulum.  IMPRESSION:  1.  Status post percutaneous drainage of complex pelvic fluid collection.  The fluid collections immediately surrounding the drainage catheters have decreased in size. 2.  There are several residual fluid collections, including some which have enlarged in the right lower quadrant.  There is a prominent fluid collection along the inferior right hepatic lobe, measuring up to 9.3 cm in diameter. 3.  Increased mesenteric and subcutaneous edema with new small right pleural effusion.  Increased bibasilar atelectasis.   Original Report Authenticated By: Vivia Ewing, M.D.    Scheduled Meds:    . antiseptic oral rinse  15 mL Mouth Rinse q12n4p  . chlorhexidine  15 mL Mouth Rinse BID  . citalopram  20 mg Oral Daily  . enoxaparin (LOVENOX) injection  40 mg Subcutaneous Daily  . ferrous sulfate  325 mg Oral TID WC  . fluconazole (DIFLUCAN) IV  400 mg Intravenous Q24H  . insulin aspart  0-20 Units Subcutaneous Q4H  . insulin aspart  0-5 Units Subcutaneous QHS  . insulin glargine  15 Units Subcutaneous QHS  . lip balm  1 application Topical BID  . piperacillin-tazobactam (ZOSYN)  IV  3.375 g Intravenous Q8H  . sodium chloride  10-40 mL Intracatheter Q12H   Continuous Infusions:    . 0.45 % NaCl with KCl 20 mEq / L 150 mL/hr at 08/25/12 0740     Active Problems:  DIABETES MELLITUS, TYPE II, UNCONTROLLED  URINALYSIS, ABNORMAL  Obesity (BMI 30-39.9)  Anxiety  Depression  Noncompliance to therapies / medications  Sepsis  CKD (chronic kidney disease)  UTI (lower urinary tract infection)  Anemia  HTN (hypertension)    Time Spent: 25 minutes  Domingo Mend, MD [facility] Pager: [phone]  If 8PM-8AM, please contact night-coverage at [email or web address], password [facility] 08/25/2012, 11:07 AM  LOS: 6 days

## 2012-08-25 NOTE — Procedures (Signed)
R perihepatic abscess drain. Pus No comp

## 2012-08-26 DIAGNOSIS — F329 Major depressive disorder, single episode, unspecified: Secondary | ICD-10-CM

## 2012-08-26 DIAGNOSIS — F3289 Other specified depressive episodes: Secondary | ICD-10-CM

## 2012-08-26 DIAGNOSIS — D509 Iron deficiency anemia, unspecified: Secondary | ICD-10-CM

## 2012-08-26 LAB — BASIC METABOLIC PANEL WITH GFR
BUN: <3 mg/dL — ABNORMAL LOW (ref 6–23)
CO2: 25 meq/L (ref 19–32)
Calcium: 8.6 mg/dL (ref 8.4–10.5)
Chloride: 101 meq/L (ref 96–112)
Creatinine, Ser: 0.83 mg/dL (ref 0.50–1.10)
GFR calc Af Amer: >90 mL/min
GFR calc non Af Amer: >90 mL/min
Glucose, Bld: 95 mg/dL (ref 70–99)
Potassium: 3.8 meq/L (ref 3.5–5.1)
Sodium: 135 meq/L (ref 135–145)

## 2012-08-26 LAB — CULTURE, BLOOD (ROUTINE X 2): Culture: NO GROWTH

## 2012-08-26 LAB — CBC
HCT: 19.9 % — ABNORMAL LOW (ref 36.0–46.0)
Hemoglobin: 6.8 g/dL — CL (ref 12.0–15.0)
RBC: 2.4 MIL/uL — ABNORMAL LOW (ref 3.87–5.11)
RDW: 13.6 % (ref 11.5–15.5)
WBC: 14.8 10*3/uL — ABNORMAL HIGH (ref 4.0–10.5)

## 2012-08-26 LAB — GLUCOSE, CAPILLARY
Glucose-Capillary: 123 mg/dL — ABNORMAL HIGH (ref 70–99)
Glucose-Capillary: 133 mg/dL — ABNORMAL HIGH (ref 70–99)

## 2012-08-26 MED ORDER — INSULIN ASPART 100 UNIT/ML ~~LOC~~ SOLN
0.0000 [IU] | Freq: Three times a day (TID) | SUBCUTANEOUS | Status: DC
  Administered 2012-08-27 (×2): 3 [IU] via SUBCUTANEOUS
  Administered 2012-08-27: 4 [IU] via SUBCUTANEOUS
  Administered 2012-08-28: 3 [IU] via SUBCUTANEOUS

## 2012-08-26 MED ORDER — INSULIN ASPART 100 UNIT/ML ~~LOC~~ SOLN: 6.0000 [IU] | Freq: Three times a day (TID) | SUBCUTANEOUS | Status: DC

## 2012-08-26 NOTE — Progress Notes (Signed)
Subjective: She is a Jehovah's witness and does not want blood products.  She is sore where the new drain was put in.  Objective: Vital signs in last 24 hours: Temp:  [97.9 F (36.6 C)-100 F (37.8 C)] 98.6 F (37 C) (10/13 0400) Pulse Rate:  [20-115] 100  (10/13 0400) Resp:  [12-32] 24  (10/13 0400) BP: (93-124)/(38-75) 111/70 mmHg (10/13 0400) SpO2:  [91 %-100 %] 99 % (10/13 0400) Weight:  [262 lb 9.1 oz (119.1 kg)] 262 lb 9.1 oz (119.1 kg) (10/13 0000) Last BM Date: 08/25/12  Intake/Output from previous day: 10/12 0701 - 10/13 0700 In: 3782.5 [P.O.:120; I.V.:3300; IV Piggyback:337.5] Out: 1638 [Urine:1375; Drains:263] Intake/Output this shift:    PE: Abd-soft, obese, output from drains is more serous appearing, mild tenderness in RUQ at drain site  Lab Results:   Basename 08/26/12 0410 08/25/12 0604  WBC 14.8* 18.0*  HGB 6.8* 7.0*  HCT 19.9* 20.0*  PLT 561* 516*   BMET  Basename 08/26/12 0410 08/25/12 0343  NA 135 132*  K 3.8 3.6  CL 101 98  CO2 25 22  GLUCOSE 95 127*  BUN <3* <3*  CREATININE 0.83 0.87  CALCIUM 8.6 8.5   PT/INR No results found for this basename: LABPROT:2,INR:2 in the last 72 hours Comprehensive Metabolic Panel:    Component Value Date/Time   NA 135 08/26/2012 0410   K 3.8 08/26/2012 0410   CL 101 08/26/2012 0410   CO2 25 08/26/2012 0410   BUN <3* 08/26/2012 0410   CREATININE 0.83 08/26/2012 0410   GLUCOSE 95 08/26/2012 0410   CALCIUM 8.6 08/26/2012 0410   AST 8 08/20/2012 0332   ALT 10 08/20/2012 0332   ALKPHOS 121* 08/20/2012 0332   BILITOT 0.3 08/20/2012 0332   PROT 6.6 08/20/2012 0332   ALBUMIN 2.3* 08/20/2012 0332     Studies/Results: Ct Guided Abscess Drain  08/25/2012  *RADIOLOGY REPORT*  Clinical Data/Indication: PERI HEPATIC ABSCESS  CT GUIDED ABCESS DRAINAGE WITH CATHETER  Sedation: Versed 4.0 mg, Fentanyl 300 mg.  Total Moderate Sedation Time: 20 minutes.  Procedure: The procedure, risks, benefits, and alternatives  were explained to the patient. Questions regarding the procedure were encouraged and answered. The patient understands and consents to the procedure.  The right upper quadrant was prepped with betadine in a sterile fashion, and a sterile drape was applied covering the operative field. A sterile gown and sterile gloves were used for the procedure.  Under CT guidance, an 18 gauge needle was inserted into the peri hepatic abscess.  It was removed over an Amplatz wire.  A 10-French dilator followed by a 10-French drain were inserted.  It was looped and string fixed in the fluid collection then sewn to the skin. Frank pus was aspirated.  Findings: Imaging demonstrates placement of a 10-French abscess drain in 20 right peri hepatic abscess.  Complications: None.  IMPRESSION: Successful right upper quadrant peri hepatic abscess drain yielding pus.   Original Report Authenticated By: Jamas Lav, M.D.    Ct Abdomen Pelvis W Contrast  08/24/2012  *RADIOLOGY REPORT*  Clinical Data: Percutaneous abscess drainage 08/20/2012.  Evaluate for possible drain removal.  CT ABDOMEN AND PELVIS WITH CONTRAST  Technique:  Multidetector CT imaging of the abdomen and pelvis was performed following the standard protocol during bolus administration of intravenous contrast.  Contrast: 177mL OMNIPAQUE IOHEXOL 300 MG/ML  SOLN  Comparison: CT 08/19/2012 and 08/20/2012.  Findings: There is a new small right pleural effusion with worsened right greater  than left basilar atelectasis.  The two right-sided percutaneous drains in the false pelvis are stable in position.  The complex pelvic fluid collection has been partially drained.  The fluid collection along the superior right aspect of the uterus is unchanged, measuring up to 4.6 cm transverse on image 82.  Collection immediately superior to the superior drain and appearing to communicate with it measures up to 6.1 cm on image 73.  There is a collection lateral to the cecum which contains  air bubbles and measures 4.7 cm on image 70.  There is a new lenticular shaped fluid collection along the inferior aspect of the right hepatic lobe, measuring 9.3 x 6.9 cm transverse on image 53.  Edema within the base of the mesentery has increased.  There is also increased subcutaneous edema.  There is no generalized ascites.  The liver, spleen, gallbladder, pancreas, adrenal glands and kidneys appear unremarkable.  There is no hydronephrosis. Retroperitoneal and mesenteric lymph nodes are likely reactive. There is a stable small fluid collection wrapping around the posterior and right aspect of the urethra on images 105 - 108 which could reflect a urethral diverticulum.  IMPRESSION:  1.  Status post percutaneous drainage of complex pelvic fluid collection.  The fluid collections immediately surrounding the drainage catheters have decreased in size. 2.  There are several residual fluid collections, including some which have enlarged in the right lower quadrant.  There is a prominent fluid collection along the inferior right hepatic lobe, measuring up to 9.3 cm in diameter. 3.  Increased mesenteric and subcutaneous edema with new small right pleural effusion.  Increased bibasilar atelectasis.   Original Report Authenticated By: Vivia Ewing, M.D.     Anti-infectives: Anti-infectives     Start     Dose/Rate Route Frequency Ordered Stop   08/20/12 0500   piperacillin-tazobactam (ZOSYN) IVPB 3.375 g        3.375 g 12.5 mL/hr over 240 Minutes Intravenous Every 8 hours 08/19/12 2210     08/19/12 2300   fluconazole (DIFLUCAN) IVPB 400 mg        400 mg 200 mL/hr over 60 Minutes Intravenous Every 24 hours 08/19/12 2145     08/19/12 2100   piperacillin-tazobactam (ZOSYN) IVPB 3.375 g        3.375 g 12.5 mL/hr over 240 Minutes Intravenous  Once 08/19/12 2014 08/20/12 0048   08/19/12 2000   cefTRIAXone (ROCEPHIN) 2 g in dextrose 5 % 50 mL IVPB        2 g 100 mL/hr over 30 Minutes Intravenous To  Emergency Dept 08/19/12 1915 08/19/12 2035          Assessment Active Problems:  Intraabdominal abscesses s/p percutaneous drainage-RUQ abscess drained yesterday with WBC decrease.  Etiology of the abscesses remains unclear.  This has been and ongoing type of problem every since her ovarian surgery and small bowel resection in 2010 at [facility].  DIABETES MELLITUS, TYPE II, UNCONTROLLED  URINALYSIS, ABNORMAL  Obesity (BMI 30-39.9)  Anxiety  Depression  Noncompliance to therapies / medications  Sepsis  CKD (chronic kidney disease)  UTI (lower urinary tract infection)  Anemia-hgb down to 6.8; no obvious bleeding source;does not want blood products.  HTN (hypertension)    LOS: 7 days   Plan:  Continue drains and abxs.  Full liquid diet.   Kirston Luty J 08/26/2012

## 2012-08-26 NOTE — Consult Note (Signed)
[facility] CONSULT FOLLOW-UP NOTE  [name] [id number] DOB: Apr 24, 1981 DOA: 08/19/2012 PCP: Mack Hook, MD Requesting physician: Michael Boston, MD Attending service: General Surgery Reason for consultation: diabetes, assistance with medical management  Impression/Recommendations: 1. Sepsis--Improving. Secondary to below. Blood cultures pending but negative to date, urine culture as below, intraabdominal fluid culture pending. Fever improved after second drain placed yesterday, hypotension resolved. Continue Zosyn, IVF. Management per surgery.  2. Suspected perforated appendicitis/Multiple Pelvic Abscesses--management deferred to general surgery. Agree with empiric Zosyn. Has had perc drains placed by IR.  3. DM type 2--Good control on current regimen. 4. UTI--Cx with Klebsiella, sensitive to zosyn. 5 days of treatment should suffice. 5. Normocytic anemia--Hb today 6.8. Blood transfusion is indicated in all critically ill in the ICU with HB <7 regardless of age. 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 Probably has a component of dilution given aggressive IVF regimen. Will decrease IVF to 75 cc/hr. 6. HTN--hold lisinopril until condition improves. BP has been stable. 7. History of PE/DVT 2010--SCDs pending procedure and surgery evaluation.  8. History of bilateral oopherectomy and small bowel resection in 2010 at [facility]  Will sign off at this point as chronic medical issues have been stable. Please call us back with any questions.  Code Status: Full code Family Communication: none present Disposition Plan: per surgery   Domingo Mend, MD [facility] Pager: [phone]   If 8PM-8AM, please contact night-coverage at [email or web address], password [facility] 08/26/2012, 10:50 AM  LOS: 7 days   Brief narrative: 31 year old woman with history of 5 days of fever, flank pain, chills, nausea, vomiting, dysuria. CT ab/pelvis suspicious for ruptured  appendix with free air in abdomen. Admitted by surgery and medicine consulted for assistance with DM and medical issues.  Procedures:    Antibiotics:  Zosyn 10/6 >>  Fluconazole 10/6 >>  HPI/Subjective:  Still has pain to the RLQ. No n/v.  Objective: Filed Vitals:   08/25/12 2200 08/26/12 0000 08/26/12 0400 08/26/12 0800  BP: 124/73 118/75 111/70 116/78  Pulse: 111 107 100 104  Temp:  99 F (37.2 C) 98.6 F (37 C) 98.3 F (36.8 C)  TempSrc:  Oral Oral Oral  Resp: 25 25 24 28   Height:      Weight:  119.1 kg (262 lb 9.1 oz)    SpO2: 99% 97% 99% 100%    Intake/Output Summary (Last 24 hours) at 08/26/12 1050 Last data filed at 08/26/12 1000  Gross per 24 hour  Intake   3950 ml  Output   1743 ml  Net   2207 ml    Exam:  General:  Appears calm, uncomfortable, ill but not toxic Cardiovascular: RRR, no m/r/g. No LE edema. Respiratory: CTA bilaterally, no w/r/r. Normal respiratory effort. tachypneic. Psychiatric: grossly normal mood and affect, speech fluent and appropriate  Data Reviewed: Basic Metabolic Panel:  Lab 0000000 0410 08/25/12 0343 08/24/12 0325 08/23/12 0320 08/22/12 0320  NA 135 132* 135 134* 137  K 3.8 3.6 4.3 4.0 3.8  CL 101 98 102 103 105  CO2 25 22 19 20  18*  GLUCOSE 95 127* 97 100* 118*  BUN <3* <3* <3* 3* 5*  CREATININE 0.83 0.87 0.85 0.87 0.94  CALCIUM 8.6 8.5 8.6 8.4 8.4  MG -- -- -- -- --  PHOS -- -- -- -- --   Liver Function Tests:  Lab 08/20/12 0332 08/19/12 1735  AST 8 9  ALT 10 14  ALKPHOS 121* 168*  BILITOT 0.3 0.3  PROT 6.6 8.6*  ALBUMIN 2.3* 2.8*    Lab 08/19/12 1735  LIPASE 19  AMYLASE --   CBC:  Lab 08/26/12 0410 08/25/12 0604 08/25/12 0343 08/24/12 0325 08/23/12 0320 08/21/12 0317 08/20/12 0332 08/19/12 1735  WBC 14.8* 18.0* 18.0* 16.5* 15.9* -- -- --  NEUTROABS -- -- -- -- 13.2* 14.8* 16.6* 16.2*  HGB 6.8* 7.0* 6.9* 9.0* 8.4* -- -- --  HCT 19.9* 20.0* 20.2* 26.5* 24.3* -- -- --  MCV 82.9 82.6 82.4 82.3 82.4  -- -- --  PLT 561* 516* 532* 419* 460* -- -- --   CBG:  Lab 08/26/12 0820 08/26/12 0347 08/26/12 0013 08/25/12 2045 08/25/12 1540  GLUCAP 94 95 123* 114* 114*    Recent Results (from the past 240 hour(s))  URINE CULTURE     Status: Normal   Collection Time   08/19/12  5:49 PM      Component Value Range Status Comment   Specimen Description URINE, CATHETERIZED   Final    Special Requests NONE   Final    Culture  Setup Time 08/20/2012 12:23   Final    Colony Count >=100,000 COLONIES/ML   Final    Culture KLEBSIELLA PNEUMONIAE   Final    Report Status 08/22/2012 FINAL   Final    Organism ID, Bacteria KLEBSIELLA PNEUMONIAE   Final   CULTURE, BLOOD (ROUTINE X 2)     Status: Normal (Preliminary result)   Collection Time   08/19/12 11:00 PM      Component Value Range Status Comment   Specimen Description BLOOD RIGHT ANTECUBITAL   Final    Special Requests BOTTLES DRAWN AEROBIC ONLY 3CC   Final    Culture  Setup Time 08/20/2012 12:19   Final    Culture     Final    Value:        BLOOD CULTURE RECEIVED NO GROWTH TO DATE CULTURE WILL BE HELD FOR 5 DAYS BEFORE ISSUING A FINAL NEGATIVE REPORT   Report Status PENDING   Incomplete   CULTURE, BLOOD (ROUTINE X 2)     Status: Normal (Preliminary result)   Collection Time   08/19/12 11:15 PM      Component Value Range Status Comment   Specimen Description BLOOD LEFT ANTECUBITAL   Final    Special Requests BOTTLES DRAWN AEROBIC AND ANAEROBIC 5CC   Final    Culture  Setup Time 08/20/2012 12:19   Final    Culture     Final    Value:        BLOOD CULTURE RECEIVED NO GROWTH TO DATE CULTURE WILL BE HELD FOR 5 DAYS BEFORE ISSUING A FINAL NEGATIVE REPORT   Report Status PENDING   Incomplete   MRSA PCR SCREENING     Status: Normal   Collection Time   08/20/12 12:22 AM      Component Value Range Status Comment   MRSA by PCR NEGATIVE  NEGATIVE Final   CULTURE, ROUTINE-ABSCESS     Status: Normal   Collection Time   08/20/12 10:24 AM      Component  Value Range Status Comment   Specimen Description PERINEAL   Final    Special Requests NONE   Final    Gram Stain     Final    Value: ABUNDANT WBC PRESENT,BOTH PMN AND MONONUCLEAR     NO SQUAMOUS EPITHELIAL CELLS SEEN     MODERATE GRAM POSITIVE COCCI     IN PAIRS MODERATE GRAM NEGATIVE RODS  FEW GRAM POSITIVE RODS   Culture     Final    Value: GROUP B STREP(S.AGALACTIAE)ISOLATED     Note: TESTING AGAINST S. AGALACTIAE NOT ROUTINELY PERFORMED DUE TO PREDICTABILITY OF AMP/PEN/VAN SUSCEPTIBILITY.   Report Status 08/22/2012 FINAL   Final   ANAEROBIC CULTURE     Status: Normal (Preliminary result)   Collection Time   08/20/12  2:57 PM      Component Value Range Status Comment   Specimen Description PERINEAL   Final    Special Requests NONE   Final    Gram Stain     Final    Value: ABUNDANT WBC PRESENT,BOTH PMN AND MONONUCLEAR     NO SQUAMOUS EPITHELIAL CELLS SEEN     MODERATE GRAM POSITIVE COCCI     IN PAIRS MODERATE GRAM NEGATIVE RODS     FEW GRAM POSITIVE RODS   Culture     Final    Value: NO ANAEROBES ISOLATED; CULTURE IN PROGRESS FOR 5 DAYS   Report Status PENDING   Incomplete   CULTURE, ROUTINE-ABSCESS     Status: Normal   Collection Time   08/20/12  3:26 PM      Component Value Range Status Comment   Specimen Description PERITONEAL CAVITY   Final    Special Requests NONE   Final    Gram Stain     Final    Value: RARE WBC PRESENT, PREDOMINANTLY PMN     NO SQUAMOUS EPITHELIAL CELLS SEEN     NO ORGANISMS SEEN   Culture NO GROWTH 3 DAYS   Final    Report Status 08/24/2012 FINAL   Final   ANAEROBIC CULTURE     Status: Normal   Collection Time   08/20/12  3:26 PM      Component Value Range Status Comment   Specimen Description PERITONEAL CAVITY   Final    Special Requests NONE   Final    Gram Stain     Final    Value: RARE WBC PRESENT, PREDOMINANTLY PMN     NO SQUAMOUS EPITHELIAL CELLS SEEN     NO ORGANISMS SEEN   Culture NO ANAEROBES ISOLATED   Final    Report Status  08/25/2012 FINAL   Final   CULTURE, BLOOD (ROUTINE X 2)     Status: Normal (Preliminary result)   Collection Time   08/24/12  4:45 PM      Component Value Range Status Comment   Specimen Description Blood   Final    Special Requests Normal   Final    Culture  Setup Time 08/24/2012 22:50   Final    Culture     Final    Value:        BLOOD CULTURE RECEIVED NO GROWTH TO DATE CULTURE WILL BE HELD FOR 5 DAYS BEFORE ISSUING A FINAL NEGATIVE REPORT   Report Status PENDING   Incomplete      Studies: Ct Guided Abscess Drain  08/25/2012  *RADIOLOGY REPORT*  Clinical Data/Indication: PERI HEPATIC ABSCESS  CT GUIDED ABCESS DRAINAGE WITH CATHETER  Sedation: Versed 4.0 mg, Fentanyl 300 mg.  Total Moderate Sedation Time: 20 minutes.  Procedure: The procedure, risks, benefits, and alternatives were explained to the patient. Questions regarding the procedure were encouraged and answered. The patient understands and consents to the procedure.  The right upper quadrant was prepped with betadine in a sterile fashion, and a sterile drape was applied covering the operative field. A sterile gown and sterile gloves were used  for the procedure.  Under CT guidance, an 18 gauge needle was inserted into the peri hepatic abscess.  It was removed over an Amplatz wire.  A 10-French dilator followed by a 10-French drain were inserted.  It was looped and string fixed in the fluid collection then sewn to the skin. Frank pus was aspirated.  Findings: Imaging demonstrates placement of a 10-French abscess drain in 20 right peri hepatic abscess.  Complications: None.  IMPRESSION: Successful right upper quadrant peri hepatic abscess drain yielding pus.   Original Report Authenticated By: Jamas Lav, M.D.    Ct Abdomen Pelvis W Contrast  08/24/2012  *RADIOLOGY REPORT*  Clinical Data: Percutaneous abscess drainage 08/20/2012.  Evaluate for possible drain removal.  CT ABDOMEN AND PELVIS WITH CONTRAST  Technique:  Multidetector CT  imaging of the abdomen and pelvis was performed following the standard protocol during bolus administration of intravenous contrast.  Contrast: 123mL OMNIPAQUE IOHEXOL 300 MG/ML  SOLN  Comparison: CT 08/19/2012 and 08/20/2012.  Findings: There is a new small right pleural effusion with worsened right greater than left basilar atelectasis.  The two right-sided percutaneous drains in the false pelvis are stable in position.  The complex pelvic fluid collection has been partially drained.  The fluid collection along the superior right aspect of the uterus is unchanged, measuring up to 4.6 cm transverse on image 82.  Collection immediately superior to the superior drain and appearing to communicate with it measures up to 6.1 cm on image 73.  There is a collection lateral to the cecum which contains air bubbles and measures 4.7 cm on image 70.  There is a new lenticular shaped fluid collection along the inferior aspect of the right hepatic lobe, measuring 9.3 x 6.9 cm transverse on image 53.  Edema within the base of the mesentery has increased.  There is also increased subcutaneous edema.  There is no generalized ascites.  The liver, spleen, gallbladder, pancreas, adrenal glands and kidneys appear unremarkable.  There is no hydronephrosis. Retroperitoneal and mesenteric lymph nodes are likely reactive. There is a stable small fluid collection wrapping around the posterior and right aspect of the urethra on images 105 - 108 which could reflect a urethral diverticulum.  IMPRESSION:  1.  Status post percutaneous drainage of complex pelvic fluid collection.  The fluid collections immediately surrounding the drainage catheters have decreased in size. 2.  There are several residual fluid collections, including some which have enlarged in the right lower quadrant.  There is a prominent fluid collection along the inferior right hepatic lobe, measuring up to 9.3 cm in diameter. 3.  Increased mesenteric and subcutaneous edema  with new small right pleural effusion.  Increased bibasilar atelectasis.   Original Report Authenticated By: Vivia Ewing, M.D.    Scheduled Meds:    . antiseptic oral rinse  15 mL Mouth Rinse q12n4p  . chlorhexidine  15 mL Mouth Rinse BID  . citalopram  20 mg Oral Daily  . enoxaparin (LOVENOX) injection  40 mg Subcutaneous Daily  . ferrous sulfate  325 mg Oral TID WC  . fluconazole (DIFLUCAN) IV  400 mg Intravenous Q24H  . insulin aspart  0-20 Units Subcutaneous Q4H  . insulin aspart  0-5 Units Subcutaneous QHS  . insulin glargine  15 Units Subcutaneous QHS  . lip balm  1 application Topical BID  . piperacillin-tazobactam (ZOSYN)  IV  3.375 g Intravenous Q8H  . sodium chloride  10-40 mL Intracatheter Q12H   Continuous Infusions:    .  0.45 % NaCl with KCl 20 mEq / L 150 mL/hr at 08/26/12 1000     Active Problems:  DIABETES MELLITUS, TYPE II, UNCONTROLLED  Obesity (BMI 30-39.9)  Anxiety  Depression  Noncompliance to therapies / medications  Sepsis  UTI (lower urinary tract infection)  Anemia  HTN (hypertension)    Time Spent: 25 minutes  Domingo Mend, MD [facility] Pager: [phone]  If 8PM-8AM, please contact night-coverage at [email or web address], password [facility] 08/26/2012, 10:50 AM  LOS: 7 days

## 2012-08-26 NOTE — Progress Notes (Signed)
CRITICAL VALUE ALERT  Critical value received:  Hgb 6.8  Date of notification:  08/26/12  Time of notification:  0438  Critical value read back:yes  Nurse who received alert:  M. Reegan Mctighe  MD notified (1st page):  Dr. Oliver Pila  Time of first page:  0539  MD notified (2nd page):  Time of second page:  Responding MD:  Dr. Oliver Pila  Time MD responded:  [phone]

## 2012-08-27 LAB — CBC
Hemoglobin: 7.1 g/dL — ABNORMAL LOW (ref 12.0–15.0)
MCHC: 33.6 g/dL (ref 30.0–36.0)
RBC: 2.52 MIL/uL — ABNORMAL LOW (ref 3.87–5.11)
WBC: 9.7 10*3/uL (ref 4.0–10.5)

## 2012-08-27 LAB — GLUCOSE, CAPILLARY
Glucose-Capillary: 125 mg/dL — ABNORMAL HIGH (ref 70–99)
Glucose-Capillary: 138 mg/dL — ABNORMAL HIGH (ref 70–99)
Glucose-Capillary: 158 mg/dL — ABNORMAL HIGH (ref 70–99)
Glucose-Capillary: 125 mg/dL — ABNORMAL HIGH (ref 70–99)

## 2012-08-27 LAB — ANAEROBIC CULTURE

## 2012-08-27 MED ORDER — DOCUSATE SODIUM 100 MG PO CAPS
100.0000 mg | ORAL_CAPSULE | Freq: Every day | ORAL | Status: DC
  Administered 2012-08-28 – 2012-09-04 (×8): 100 mg via ORAL
  Filled 2012-08-27 (×9): qty 1

## 2012-08-27 MED ORDER — GLIPIZIDE ER 10 MG PO TB24
10.0000 mg | ORAL_TABLET | Freq: Every day | ORAL | Status: DC
  Administered 2012-08-28: 10 mg via ORAL
  Filled 2012-08-27 (×3): qty 1

## 2012-08-27 MED ORDER — METFORMIN HCL 500 MG PO TABS
500.0000 mg | ORAL_TABLET | Freq: Two times a day (BID) | ORAL | Status: DC
  Administered 2012-08-27 – 2012-08-28 (×3): 500 mg via ORAL
  Filled 2012-08-27 (×6): qty 1

## 2012-08-27 MED ORDER — BOOST / RESOURCE BREEZE PO LIQD
1.0000 | Freq: Two times a day (BID) | ORAL | Status: DC
  Administered 2012-08-27 – 2012-09-03 (×13): 1 via ORAL

## 2012-08-27 NOTE — Progress Notes (Signed)
Patient interviewed and examined, agree with PA note above.  Edward Jolly MD, FACS  08/27/2012 2:20 PM

## 2012-08-27 NOTE — Progress Notes (Signed)
[id number]/Rhonda Davis, RN, BSN, CCM: CHART REVIEWED AND UPDATED. NO DISCHARGE NEEDS PRESENT AT THIS TIME. CASE MANAGEMENT [phone] 

## 2012-08-27 NOTE — Progress Notes (Signed)
Subjective: Pt feels ok. Some better  Objective: Physical Exam: BP 115/76  Pulse 94  Temp 98.7 F (37.1 C) (Oral)  Resp 22  Ht 5\' 8"  (1.727 m)  Wt 259 lb 7.7 oz (117.7 kg)  BMI 39.45 kg/m2  SpO2 100%  LMP 08/06/2012 All right sided drains intact, sites clean. Stable output except from #2 which is scant serosanguinous.   Labs: CBC  Basename 08/27/12 0402 08/26/12 0410  WBC 9.7 14.8*  HGB 7.1* 6.8*  HCT 21.1* 19.9*  PLT 670* 561*   BMET  Basename 08/26/12 0410 08/25/12 0343  NA 135 132*  K 3.8 3.6  CL 101 98  CO2 25 22  GLUCOSE 95 127*  BUN <3* <3*  CREATININE 0.83 0.87  CALCIUM 8.6 8.5   LFT No results found for this basename: PROT,ALBUMIN,AST,ALT,ALKPHOS,BILITOT,BILIDIR,IBILI,LIPASE in the last 72 hours PT/INR No results found for this basename: LABPROT:2,INR:2 in the last 72 hours   Studies/Results: Ct Guided Abscess Drain  08/25/2012  *RADIOLOGY REPORT*  Clinical Data/Indication: PERI HEPATIC ABSCESS  CT GUIDED ABCESS DRAINAGE WITH CATHETER  Sedation: Versed 4.0 mg, Fentanyl 300 mg.  Total Moderate Sedation Time: 20 minutes.  Procedure: The procedure, risks, benefits, and alternatives were explained to the patient. Questions regarding the procedure were encouraged and answered. The patient understands and consents to the procedure.  The right upper quadrant was prepped with betadine in a sterile fashion, and a sterile drape was applied covering the operative field. A sterile gown and sterile gloves were used for the procedure.  Under CT guidance, an 18 gauge needle was inserted into the peri hepatic abscess.  It was removed over an Amplatz wire.  A 10-French dilator followed by a 10-French drain were inserted.  It was looped and string fixed in the fluid collection then sewn to the skin. Frank pus was aspirated.  Findings: Imaging demonstrates placement of a 10-French abscess drain in 20 right peri hepatic abscess.  Complications: None.  IMPRESSION: Successful right  upper quadrant peri hepatic abscess drain yielding pus.   Original Report Authenticated By: Jamas Lav, M.D.     Assessment/Plan: Multiple intra-abdominal abscesses s/p perc drainage X 3. WBC finally into normal range, afebrile Will cont to follow. Repeat CT scan later this week as long as good progress.    LOS: 8 days    Ascencion Dike PA-C 08/27/2012 10:27 AM

## 2012-08-27 NOTE — Progress Notes (Signed)
Patient ID: Joelene Giel, female   DOB: 1981/05/05, 31 y.o.   MRN: [id number]    Subjective: Pt feels better today.  Still not getting up out of bed.  Tolerating full liquids.  Pain is less.  Objective: Vital signs in last 24 hours: Temp:  [98.4 F (36.9 C)-99 F (37.2 C)] 98.7 F (37.1 C) (10/14 0800) Pulse Rate:  [94-102] 94  (10/14 0800) Resp:  [20-26] 22  (10/14 0800) BP: (96-115)/(45-79) 115/76 mmHg (10/14 0800) SpO2:  [98 %-100 %] 100 % (10/14 0800) Weight:  [259 lb 7.7 oz (117.7 kg)] 259 lb 7.7 oz (117.7 kg) (10/14 0100) Last BM Date: 08/25/12  Intake/Output from previous day: 10/13 0701 - 10/14 0700 In: 2640 [P.O.:420; I.V.:2025; IV Piggyback:150] Out: 2172 [Urine:2100; Drains:72] Intake/Output this shift: Total I/O In: 230 [P.O.:80; I.V.:150] Out: 525 [Urine:525]  PE: Abd: soft, much less tender than on Friday when I saw her, +BS, ND, JP1 with serosang output, JP 2 with serosang output, JP 3 with serous output. Heart: mildly tachy Lungs: CTAB  Lab Results:   Basename 08/27/12 0402 08/26/12 0410  WBC 9.7 14.8*  HGB 7.1* 6.8*  HCT 21.1* 19.9*  PLT 670* 561*   BMET  Basename 08/26/12 0410 08/25/12 0343  NA 135 132*  K 3.8 3.6  CL 101 98  CO2 25 22  GLUCOSE 95 127*  BUN <3* <3*  CREATININE 0.83 0.87  CALCIUM 8.6 8.5   PT/INR No results found for this basename: LABPROT:2,INR:2 in the last 72 hours CMP     Component Value Date/Time   NA 135 08/26/2012 0410   K 3.8 08/26/2012 0410   CL 101 08/26/2012 0410   CO2 25 08/26/2012 0410   GLUCOSE 95 08/26/2012 0410   BUN <3* 08/26/2012 0410   CREATININE 0.83 08/26/2012 0410   CALCIUM 8.6 08/26/2012 0410   PROT 6.6 08/20/2012 0332   ALBUMIN 2.3* 08/20/2012 0332   AST 8 08/20/2012 0332   ALT 10 08/20/2012 0332   ALKPHOS 121* 08/20/2012 0332   BILITOT 0.3 08/20/2012 0332   GFRNONAA >90 08/26/2012 0410   GFRAA >90 08/26/2012 0410   Lipase     Component Value Date/Time   LIPASE 19 08/19/2012 1735        Studies/Results: Ct Guided Abscess Drain  08/25/2012  *RADIOLOGY REPORT*  Clinical Data/Indication: PERI HEPATIC ABSCESS  CT GUIDED ABCESS DRAINAGE WITH CATHETER  Sedation: Versed 4.0 mg, Fentanyl 300 mg.  Total Moderate Sedation Time: 20 minutes.  Procedure: The procedure, risks, benefits, and alternatives were explained to the patient. Questions regarding the procedure were encouraged and answered. The patient understands and consents to the procedure.  The right upper quadrant was prepped with betadine in a sterile fashion, and a sterile drape was applied covering the operative field. A sterile gown and sterile gloves were used for the procedure.  Under CT guidance, an 18 gauge needle was inserted into the peri hepatic abscess.  It was removed over an Amplatz wire.  A 10-French dilator followed by a 10-French drain were inserted.  It was looped and string fixed in the fluid collection then sewn to the skin. Frank pus was aspirated.  Findings: Imaging demonstrates placement of a 10-French abscess drain in 20 right peri hepatic abscess.  Complications: None.  IMPRESSION: Successful right upper quadrant peri hepatic abscess drain yielding pus.   Original Report Authenticated By: Jamas Lav, M.D.     Anti-infectives: Anti-infectives     Start     Dose/Rate  Route Frequency Ordered Stop   08/20/12 0500  piperacillin-tazobactam (ZOSYN) IVPB 3.375 g       3.375 g 12.5 mL/hr over 240 Minutes Intravenous Every 8 hours 08/19/12 2210     08/19/12 2300   fluconazole (DIFLUCAN) IVPB 400 mg        400 mg 200 mL/hr over 60 Minutes Intravenous Every 24 hours 08/19/12 2145     08/19/12 2100  piperacillin-tazobactam (ZOSYN) IVPB 3.375 g       3.375 g 12.5 mL/hr over 240 Minutes Intravenous  Once 08/19/12 2014 08/20/12 0048   08/19/12 2000   cefTRIAXone (ROCEPHIN) 2 g in dextrose 5 % 50 mL IVPB        2 g 100 mL/hr over 30 Minutes Intravenous To Emergency Dept 08/19/12 1915 08/19/12 2035            Assessment/Plan  1. Pelvic and subhepatic abscesses, etiology unclear (cx showing no growth?) 2. Medical noncompliance 3. DM 4. Depression 5. HTN 6. Fe deficiency anemia Plan: 1. Cont abx therapy for now as patient seems to be improving after placement of 3rd drain.  WBC has normalized as well as her temperature. 2. tx to the floor 3.  Cont Fe supplementation 4. Advance to regular, carb mod diet 5. Cont SSI for DM 6. Will need repeat CT scan later this week.  LOS: 8 days    Arshiya Jakes E 08/27/2012, 11:20 AM Pager: [phone]
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
## 2012-08-28 LAB — GLUCOSE, CAPILLARY
Glucose-Capillary: 143 mg/dL — ABNORMAL HIGH (ref 70–99)
Glucose-Capillary: 92 mg/dL (ref 70–99)
Glucose-Capillary: 84 mg/dL (ref 70–99)

## 2012-08-28 LAB — CULTURE, ROUTINE-ABSCESS

## 2012-08-28 MED ORDER — SODIUM CHLORIDE 0.9 % IV SOLN
500.0000 mg | Freq: Every day | INTRAVENOUS | Status: AC
  Administered 2012-08-28 – 2012-08-30 (×3): 500 mg via INTRAVENOUS
  Filled 2012-08-28 (×5): qty 10

## 2012-08-28 MED ORDER — SODIUM CHLORIDE 0.9 % IV SOLN
25.0000 mg | Freq: Once | INTRAVENOUS | Status: AC
  Administered 2012-08-28: 25 mg via INTRAVENOUS
  Filled 2012-08-28: qty 0.5

## 2012-08-28 NOTE — Progress Notes (Signed)
Subjective: Pt feeling better overall; no new c/o; has some soreness at drain sites  Objective: Vital signs in last 24 hours: Temp:  [99.2 F (37.3 C)-99.4 F (37.4 C)] 99.4 F (37.4 C) (10/15 1400) Pulse Rate:  [101-111] 101  (10/15 1400) Resp:  [20] 20  (10/15 1400) BP: (100-121)/(61-68) 121/68 mmHg (10/15 1400) SpO2:  [93 %-97 %] 93 % (10/15 1400) Last BM Date: 08/27/12  Intake/Output from previous day: 10/14 0701 - 10/15 0700 In: 3266.3 [P.O.:1000; I.V.:1738.8; IV Piggyback:512.5] Out: 2860 [Urine:2775; Drains:85] Intake/Output this shift: Total I/O In: 906.3 [P.O.:240; I.V.:586.3; Other:30; IV Piggyback:50] Out: 630 [Urine:600; Drains:30]  RLQ/ right perihepatic drains intact, outputs minimal today; insertion sites ok; fluid cx's neg  Lab Results:   Basename 08/27/12 0402 08/26/12 0410  WBC 9.7 14.8*  HGB 7.1* 6.8*  HCT 21.1* 19.9*  PLT 670* 561*   BMET  Basename 08/26/12 0410  NA 135  K 3.8  CL 101  CO2 25  GLUCOSE 95  BUN <3*  CREATININE 0.83  CALCIUM 8.6   PT/INR No results found for this basename: LABPROT:2,INR:2 in the last 72 hours ABG No results found for this basename: PHART:2,PCO2:2,PO2:2,HCO3:2 in the last 72 hours Results for orders placed during the hospital encounter of 08/19/12  URINE CULTURE     Status: Normal   Collection Time   08/19/12  5:49 PM      Component Value Range Status Comment   Specimen Description URINE, CATHETERIZED   Final    Special Requests NONE   Final    Culture  Setup Time 08/20/2012 12:23   Final    Colony Count >=100,000 COLONIES/ML   Final    Culture KLEBSIELLA PNEUMONIAE   Final    Report Status 08/22/2012 FINAL   Final    Organism ID, Bacteria KLEBSIELLA PNEUMONIAE   Final   CULTURE, BLOOD (ROUTINE X 2)     Status: Normal   Collection Time   08/19/12 11:00 PM      Component Value Range Status Comment   Specimen Description BLOOD RIGHT ANTECUBITAL   Final    Special Requests BOTTLES DRAWN AEROBIC ONLY  3CC   Final    Culture  Setup Time 08/20/2012 12:19   Final    Culture NO GROWTH 5 DAYS   Final    Report Status 08/26/2012 FINAL   Final   CULTURE, BLOOD (ROUTINE X 2)     Status: Normal   Collection Time   08/19/12 11:15 PM      Component Value Range Status Comment   Specimen Description BLOOD LEFT ANTECUBITAL   Final    Special Requests BOTTLES DRAWN AEROBIC AND ANAEROBIC 5CC   Final    Culture  Setup Time 08/20/2012 12:19   Final    Culture NO GROWTH 5 DAYS   Final    Report Status 08/26/2012 FINAL   Final   MRSA PCR SCREENING     Status: Normal   Collection Time   08/20/12 12:22 AM      Component Value Range Status Comment   MRSA by PCR NEGATIVE  NEGATIVE Final   CULTURE, ROUTINE-ABSCESS     Status: Normal   Collection Time   08/20/12 10:24 AM      Component Value Range Status Comment   Specimen Description PERINEAL   Final    Special Requests NONE   Final    Gram Stain     Final    Value: ABUNDANT WBC PRESENT,BOTH PMN AND MONONUCLEAR  NO SQUAMOUS EPITHELIAL CELLS SEEN     MODERATE GRAM POSITIVE COCCI     IN PAIRS MODERATE GRAM NEGATIVE RODS     FEW GRAM POSITIVE RODS   Culture     Final    Value: GROUP B STREP(S.AGALACTIAE)ISOLATED     Note: TESTING AGAINST S. AGALACTIAE NOT ROUTINELY PERFORMED DUE TO PREDICTABILITY OF AMP/PEN/VAN SUSCEPTIBILITY.   Report Status 08/22/2012 FINAL   Final   ANAEROBIC CULTURE     Status: Normal   Collection Time   08/20/12  2:57 PM      Component Value Range Status Comment   Specimen Description PERINEAL   Final    Special Requests NONE   Final    Gram Stain     Final    Value: ABUNDANT WBC PRESENT,BOTH PMN AND MONONUCLEAR     NO SQUAMOUS EPITHELIAL CELLS SEEN     MODERATE GRAM POSITIVE COCCI     IN PAIRS MODERATE GRAM NEGATIVE RODS     FEW GRAM POSITIVE RODS   Culture     Final    Value: ABUNDANT BACTEROIDES FRAGILIS     Note: BETA LACTAMASE POSITIVE   Report Status 08/27/2012 FINAL   Final   CULTURE, ROUTINE-ABSCESS     Status:  Normal   Collection Time   08/20/12  3:26 PM      Component Value Range Status Comment   Specimen Description PERITONEAL CAVITY   Final    Special Requests NONE   Final    Gram Stain     Final    Value: RARE WBC PRESENT, PREDOMINANTLY PMN     NO SQUAMOUS EPITHELIAL CELLS SEEN     NO ORGANISMS SEEN   Culture NO GROWTH 3 DAYS   Final    Report Status 08/24/2012 FINAL   Final   ANAEROBIC CULTURE     Status: Normal   Collection Time   08/20/12  3:26 PM      Component Value Range Status Comment   Specimen Description PERITONEAL CAVITY   Final    Special Requests NONE   Final    Gram Stain     Final    Value: RARE WBC PRESENT, PREDOMINANTLY PMN     NO SQUAMOUS EPITHELIAL CELLS SEEN     NO ORGANISMS SEEN   Culture NO ANAEROBES ISOLATED   Final    Report Status 08/25/2012 FINAL   Final   CULTURE, BLOOD (ROUTINE X 2)     Status: Normal (Preliminary result)   Collection Time   08/24/12  4:45 PM      Component Value Range Status Comment   Specimen Description Blood   Final    Special Requests Normal   Final    Culture  Setup Time 08/24/2012 22:50   Final    Culture     Final    Value:        BLOOD CULTURE RECEIVED NO GROWTH TO DATE CULTURE WILL BE HELD FOR 5 DAYS BEFORE ISSUING A FINAL NEGATIVE REPORT   Report Status PENDING   Incomplete   CULTURE, BLOOD (ROUTINE X 2)     Status: Normal (Preliminary result)   Collection Time   08/24/12  6:25 PM      Component Value Range Status Comment   Specimen Description BLOOD LEFT HAND   Final    Special Requests BOTTLES DRAWN AEROBIC ONLY  2ML   Final    Culture  Setup Time 08/25/2012 01:10   Final    Culture  Final    Value:        BLOOD CULTURE RECEIVED NO GROWTH TO DATE CULTURE WILL BE HELD FOR 5 DAYS BEFORE ISSUING A FINAL NEGATIVE REPORT   Report Status PENDING   Incomplete   CULTURE, ROUTINE-ABSCESS     Status: Normal   Collection Time   08/25/12  1:49 PM      Component Value Range Status Comment   Specimen Description PERITONEAL  CAVITY   Final    Special Requests NONE   Final    Gram Stain     Final    Value: NO WBC SEEN     NO SQUAMOUS EPITHELIAL CELLS SEEN     NO ORGANISMS SEEN   Culture NO GROWTH 3 DAYS   Final    Report Status 08/28/2012 FINAL   Final     Studies/Results: No results found.  Anti-infectives: Anti-infectives     Start     Dose/Rate Route Frequency Ordered Stop   08/20/12 0500   piperacillin-tazobactam (ZOSYN) IVPB 3.375 g        3.375 g 12.5 mL/hr over 240 Minutes Intravenous Every 8 hours 08/19/12 2210     08/19/12 2300   fluconazole (DIFLUCAN) IVPB 400 mg        400 mg 200 mL/hr over 60 Minutes Intravenous Every 24 hours 08/19/12 2145     08/19/12 2100   piperacillin-tazobactam (ZOSYN) IVPB 3.375 g        3.375 g 12.5 mL/hr over 240 Minutes Intravenous  Once 08/19/12 2014 08/20/12 0048   08/19/12 2000   cefTRIAXone (ROCEPHIN) 2 g in dextrose 5 % 50 mL IVPB        2 g 100 mL/hr over 30 Minutes Intravenous To Emergency Dept 08/19/12 1915 08/19/12 2035          Assessment/Plan: S/p RLQ/ right perihepatic fluid coll drainages 10/7, 10/12; plans as per [facility].  LOS: 9 days    Nyla Creason,D [facility] 08/28/2012

## 2012-08-28 NOTE — Progress Notes (Signed)
Patient interviewed and examined, agree with PA note above.  Edward Jolly MD, FACS  08/28/2012 10:37 AM

## 2012-08-28 NOTE — Progress Notes (Signed)
Patient ID: [name], female   DOB: 1981-03-22, 31 y.o.   MRN: [id number]    Subjective: Pt is feeling better.  Trying to eat solid food, but doesn't have a great appetite.  Pain is less.  Still not ambulating.  Objective: Vital signs in last 24 hours: Temp:  [97.8 F (36.6 C)-99.2 F (37.3 C)] 99.2 F (37.3 C) (10/15 0557) Pulse Rate:  [107-113] 111  (10/15 0557) Resp:  [20-22] 20  (10/15 0557) BP: (100-118)/(61-66) 100/61 mmHg (10/15 0557) SpO2:  [93 %-97 %] 97 % (10/15 0557) Last BM Date: 08/27/12  Intake/Output from previous day: 10/14 0701 - 10/15 0700 In: 3266.3 [P.O.:1000; I.V.:1738.8; IV Piggyback:512.5] Out: 2860 [Urine:2775; Drains:85] Intake/Output this shift: Total I/O In: 120 [P.O.:120] Out: 600 [Urine:600]  PE: Abd: soft, less tender, JP 1&2 with serosang output.  JP 3 with cloudy serous output., +BS Heart: tachy Lungs: CTAB  Lab Results:   Basename 08/27/12 0402 08/26/12 0410  WBC 9.7 14.8*  HGB 7.1* 6.8*  HCT 21.1* 19.9*  PLT 670* 561*   BMET  Basename 08/26/12 0410  NA 135  K 3.8  CL 101  CO2 25  GLUCOSE 95  BUN <3*  CREATININE 0.83  CALCIUM 8.6   PT/INR No results found for this basename: LABPROT:2,INR:2 in the last 72 hours CMP     Component Value Date/Time   NA 135 08/26/2012 0410   K 3.8 08/26/2012 0410   CL 101 08/26/2012 0410   CO2 25 08/26/2012 0410   GLUCOSE 95 08/26/2012 0410   BUN <3* 08/26/2012 0410   CREATININE 0.83 08/26/2012 0410   CALCIUM 8.6 08/26/2012 0410   PROT 6.6 08/20/2012 0332   ALBUMIN 2.3* 08/20/2012 0332   AST 8 08/20/2012 0332   ALT 10 08/20/2012 0332   ALKPHOS 121* 08/20/2012 0332   BILITOT 0.3 08/20/2012 0332   GFRNONAA >90 08/26/2012 0410   GFRAA >90 08/26/2012 0410   Lipase     Component Value Date/Time   LIPASE 19 08/19/2012 1735       Studies/Results: No results found.  Anti-infectives: Anti-infectives     Start     Dose/Rate Route Frequency Ordered Stop   08/20/12 0500   piperacillin-tazobactam (ZOSYN) IVPB 3.375 g       3.375 g 12.5 mL/hr over 240 Minutes Intravenous Every 8 hours 08/19/12 2210     08/19/12 2300   fluconazole (DIFLUCAN) IVPB 400 mg        400 mg 200 mL/hr over 60 Minutes Intravenous Every 24 hours 08/19/12 2145     08/19/12 2100  piperacillin-tazobactam (ZOSYN) IVPB 3.375 g       3.375 g 12.5 mL/hr over 240 Minutes Intravenous  Once 08/19/12 2014 08/20/12 0048   08/19/12 2000   cefTRIAXone (ROCEPHIN) 2 g in dextrose 5 % 50 mL IVPB        2 g 100 mL/hr over 30 Minutes Intravenous To Emergency Dept 08/19/12 1915 08/19/12 2035           Assessment/Plan  1. Pelvic abscess, s/p perc drain 2. Subhepatic abscess, s/p perc drain 3. Fe def anemia  Plan: 1. D/w hospitalist about what to do about anemia.  I already have patient on oral supplement of iron. They recommend adding 500mg  of IV Fe to that daily as well. 2. Cont with drains. 3. Repeat CT scan later this week 4. Cont abx therapy for now 5. Cont to encourage ambulation!     LOS: 9 days  Destinie Thornsberry E 08/28/2012, 10:32 AM Pager: [phone]

## 2012-08-28 NOTE — Progress Notes (Signed)
ANTIBIOTIC CONSULT NOTE - Follow up  Pharmacy Consult for Zosyn Indication: abdominal abscess, UTI  No Known Allergies  Patient Measurements: Height: 5\' 8"  (172.7 cm) Weight: 259 lb 7.7 oz (117.7 kg) IBW/kg (Calculated) : 63.9   Vital Signs: Temp: 99.2 F (37.3 C) (10/15 0557) Temp src: Oral (10/15 0557) BP: 100/61 mmHg (10/15 0557) Pulse Rate: 111  (10/15 0557) Intake/Output from previous day: 10/14 0701 - 10/15 0700 In: 3266.3 [P.O.:1000; I.V.:1738.8; IV Piggyback:512.5] Out: 2860 [Urine:2775; Drains:85]  Labs:  [facility] 08/27/12 0402 08/26/12 0410  WBC 9.7 14.8*  HGB 7.1* 6.8*  PLT 670* 561*  LABCREA -- --  CREATININE -- 0.83   Estimated Creatinine Clearance: 132.4 ml/min (by C-G formula based on Cr of 0.83).  Microbiology: 10/6 blood x 2 >> ngF 10/6 urine >> Klebsiella (Sens to cefazolin, ceftriaxone, cipro, gent, levo, zosyn, bactrim) 10/7 mrsa pcr >> negative 10/7 Peritoneal cavity NG F 10/7 perineal abscess Group B strep 10/7 perineal anaerobic: abundant bacteroides fragilis 10/11 Blood x 2: NGtd 10/12 abscess: NGF   Assessment:  34 YOF admit 10/6 with abdominal abscesses.  D#10 Zosyn EI and fluconazole 400 IV q24h for pelvic and subhepatic abscesses of uncertain etiology and Klebsiella UTI. S/P perc drainage x2.  10/7 Perineal abscess culture = Group B Strep.  The anaerobic culture for the perineal abscess resulted yesterday with B.fragilis, Zosyn would cover.  WBC now WNL, patient is afebrile, Scr stable/wnl, CrCl >100 ml/min.  Plan:   Continue Zosyn 3.375g IV Q8H infused over 4hrs.  Continue to monitor and adjust if necessary  Duration of therapy per MD   Hershal Coria, PharmD, BCPS Pager: [phone] 08/28/2012 1:44 PM

## 2012-08-29 LAB — CBC
Hemoglobin: 7.3 g/dL — ABNORMAL LOW (ref 12.0–15.0)
Platelets: 686 10*3/uL — ABNORMAL HIGH (ref 150–400)
RBC: 2.64 MIL/uL — ABNORMAL LOW (ref 3.87–5.11)
WBC: 8.8 10*3/uL (ref 4.0–10.5)

## 2012-08-29 LAB — GLUCOSE, CAPILLARY
Glucose-Capillary: 65 mg/dL — ABNORMAL LOW (ref 70–99)
Glucose-Capillary: 113 mg/dL — ABNORMAL HIGH (ref 70–99)
Glucose-Capillary: 132 mg/dL — ABNORMAL HIGH (ref 70–99)

## 2012-08-29 MED ORDER — INSULIN ASPART 100 UNIT/ML ~~LOC~~ SOLN
0.0000 [IU] | Freq: Three times a day (TID) | SUBCUTANEOUS | Status: DC
  Administered 2012-08-29: 1 [IU] via SUBCUTANEOUS
  Administered 2012-08-30: 2 [IU] via SUBCUTANEOUS
  Administered 2012-08-30: 3 [IU] via SUBCUTANEOUS
  Administered 2012-08-30: 2 [IU] via SUBCUTANEOUS
  Administered 2012-08-31 (×2): 3 [IU] via SUBCUTANEOUS
  Administered 2012-08-31: 2 [IU] via SUBCUTANEOUS
  Administered 2012-09-01: 3 [IU] via SUBCUTANEOUS
  Administered 2012-09-01 – 2012-09-02 (×4): 2 [IU] via SUBCUTANEOUS
  Administered 2012-09-02: 1 [IU] via SUBCUTANEOUS
  Administered 2012-09-03 (×2): 2 [IU] via SUBCUTANEOUS
  Administered 2012-09-03: 1 [IU] via SUBCUTANEOUS
  Administered 2012-09-04: 2 [IU] via SUBCUTANEOUS

## 2012-08-29 MED ORDER — FLUCONAZOLE 200 MG PO TABS
400.0000 mg | ORAL_TABLET | Freq: Every day | ORAL | Status: DC
  Administered 2012-08-29 – 2012-09-03 (×6): 400 mg via ORAL
  Filled 2012-08-29 (×7): qty 2

## 2012-08-29 MED ORDER — GLUCOSE 40 % PO GEL: 1.0000 | ORAL | Status: DC | PRN

## 2012-08-29 MED ORDER — GLUCOSE-VITAMIN C 4-6 GM-MG PO CHEW: 4.0000 | CHEWABLE_TABLET | ORAL | Status: DC | PRN

## 2012-08-29 NOTE — Progress Notes (Signed)
Subjective: Pt resting quietly; no new c/o  Objective: Vital signs in last 24 hours: Temp:  [98.1 F (36.7 C)-98.7 F (37.1 C)] 98.1 F (36.7 C) (10/16 1400) Pulse Rate:  [100-110] 100  (10/16 1400) Resp:  [20] 20  (10/16 1400) BP: (100-109)/(65-67) 109/66 mmHg (10/16 1400) SpO2:  [93 %-94 %] 94 % (10/16 1400) Last BM Date: 08/28/12  Intake/Output from previous day: 10/15 0701 - 10/16 0700 In: 2610 [P.O.:1320; I.V.:920; IV Piggyback:310] Out: 2876 [Urine:2750; Drains:125; Stool:1] Intake/Output this shift: Total I/O In: 1716.3 [P.O.:240; I.V.:1476.3] Out: 867 [Urine:850; Drains:17]  abd /pelvic drains intact, outputs < 10 cc's today, 30-50 cc's 10/15  Lab Results:   Basename 08/29/12 0520 08/27/12 0402  WBC 8.8 9.7  HGB 7.3* 7.1*  HCT 22.9* 21.1*  PLT 686* 670*   BMET No results found for this basename: NA:2,K:2,CL:2,CO2:2,GLUCOSE:2,BUN:2,CREATININE:2,CALCIUM:2 in the last 72 hours PT/INR No results found for this basename: LABPROT:2,INR:2 in the last 72 hours ABG No results found for this basename: PHART:2,PCO2:2,PO2:2,HCO3:2 in the last 72 hours Results for orders placed during the hospital encounter of 08/19/12  URINE CULTURE     Status: Normal   Collection Time   08/19/12  5:49 PM      Component Value Range Status Comment   Specimen Description URINE, CATHETERIZED   Final    Special Requests NONE   Final    Culture  Setup Time 08/20/2012 12:23   Final    Colony Count >=100,000 COLONIES/ML   Final    Culture KLEBSIELLA PNEUMONIAE   Final    Report Status 08/22/2012 FINAL   Final    Organism ID, Bacteria KLEBSIELLA PNEUMONIAE   Final   CULTURE, BLOOD (ROUTINE X 2)     Status: Normal   Collection Time   08/19/12 11:00 PM      Component Value Range Status Comment   Specimen Description BLOOD RIGHT ANTECUBITAL   Final    Special Requests BOTTLES DRAWN AEROBIC ONLY 3CC   Final    Culture  Setup Time 08/20/2012 12:19   Final    Culture NO GROWTH 5 DAYS    Final    Report Status 08/26/2012 FINAL   Final   CULTURE, BLOOD (ROUTINE X 2)     Status: Normal   Collection Time   08/19/12 11:15 PM      Component Value Range Status Comment   Specimen Description BLOOD LEFT ANTECUBITAL   Final    Special Requests BOTTLES DRAWN AEROBIC AND ANAEROBIC 5CC   Final    Culture  Setup Time 08/20/2012 12:19   Final    Culture NO GROWTH 5 DAYS   Final    Report Status 08/26/2012 FINAL   Final   MRSA PCR SCREENING     Status: Normal   Collection Time   08/20/12 12:22 AM      Component Value Range Status Comment   MRSA by PCR NEGATIVE  NEGATIVE Final   CULTURE, ROUTINE-ABSCESS     Status: Normal   Collection Time   08/20/12 10:24 AM      Component Value Range Status Comment   Specimen Description PERINEAL   Final    Special Requests NONE   Final    Gram Stain     Final    Value: ABUNDANT WBC PRESENT,BOTH PMN AND MONONUCLEAR     NO SQUAMOUS EPITHELIAL CELLS SEEN     MODERATE GRAM POSITIVE COCCI     IN PAIRS MODERATE GRAM NEGATIVE RODS  FEW GRAM POSITIVE RODS   Culture     Final    Value: GROUP B STREP(S.AGALACTIAE)ISOLATED     Note: TESTING AGAINST S. AGALACTIAE NOT ROUTINELY PERFORMED DUE TO PREDICTABILITY OF AMP/PEN/VAN SUSCEPTIBILITY.   Report Status 08/22/2012 FINAL   Final   ANAEROBIC CULTURE     Status: Normal   Collection Time   08/20/12  2:57 PM      Component Value Range Status Comment   Specimen Description PERINEAL   Final    Special Requests NONE   Final    Gram Stain     Final    Value: ABUNDANT WBC PRESENT,BOTH PMN AND MONONUCLEAR     NO SQUAMOUS EPITHELIAL CELLS SEEN     MODERATE GRAM POSITIVE COCCI     IN PAIRS MODERATE GRAM NEGATIVE RODS     FEW GRAM POSITIVE RODS   Culture     Final    Value: ABUNDANT BACTEROIDES FRAGILIS     Note: BETA LACTAMASE POSITIVE   Report Status 08/27/2012 FINAL   Final   CULTURE, ROUTINE-ABSCESS     Status: Normal   Collection Time   08/20/12  3:26 PM      Component Value Range Status Comment    Specimen Description PERITONEAL CAVITY   Final    Special Requests NONE   Final    Gram Stain     Final    Value: RARE WBC PRESENT, PREDOMINANTLY PMN     NO SQUAMOUS EPITHELIAL CELLS SEEN     NO ORGANISMS SEEN   Culture NO GROWTH 3 DAYS   Final    Report Status 08/24/2012 FINAL   Final   ANAEROBIC CULTURE     Status: Normal   Collection Time   08/20/12  3:26 PM      Component Value Range Status Comment   Specimen Description PERITONEAL CAVITY   Final    Special Requests NONE   Final    Gram Stain     Final    Value: RARE WBC PRESENT, PREDOMINANTLY PMN     NO SQUAMOUS EPITHELIAL CELLS SEEN     NO ORGANISMS SEEN   Culture NO ANAEROBES ISOLATED   Final    Report Status 08/25/2012 FINAL   Final   CULTURE, BLOOD (ROUTINE X 2)     Status: Normal (Preliminary result)   Collection Time   08/24/12  4:45 PM      Component Value Range Status Comment   Specimen Description Blood   Final    Special Requests Normal   Final    Culture  Setup Time 08/24/2012 22:50   Final    Culture     Final    Value:        BLOOD CULTURE RECEIVED NO GROWTH TO DATE CULTURE WILL BE HELD FOR 5 DAYS BEFORE ISSUING A FINAL NEGATIVE REPORT   Report Status PENDING   Incomplete   CULTURE, BLOOD (ROUTINE X 2)     Status: Normal (Preliminary result)   Collection Time   08/24/12  6:25 PM      Component Value Range Status Comment   Specimen Description BLOOD LEFT HAND   Final    Special Requests BOTTLES DRAWN AEROBIC ONLY  2ML   Final    Culture  Setup Time 08/25/2012 01:10   Final    Culture     Final    Value:        BLOOD CULTURE RECEIVED NO GROWTH TO DATE CULTURE WILL BE HELD  FOR 5 DAYS BEFORE ISSUING A FINAL NEGATIVE REPORT   Report Status PENDING   Incomplete   CULTURE, ROUTINE-ABSCESS     Status: Normal   Collection Time   08/25/12  1:49 PM      Component Value Range Status Comment   Specimen Description PERITONEAL CAVITY   Final    Special Requests NONE   Final    Gram Stain     Final    Value: NO WBC  SEEN     NO SQUAMOUS EPITHELIAL CELLS SEEN     NO ORGANISMS SEEN   Culture NO GROWTH 3 DAYS   Final    Report Status 08/28/2012 FINAL   Final     Studies/Results: No results found.  Anti-infectives: Anti-infectives     Start     Dose/Rate Route Frequency Ordered Stop   08/29/12 2200   fluconazole (DIFLUCAN) tablet 400 mg        400 mg Oral Daily at bedtime 08/29/12 1409     08/20/12 0500  piperacillin-tazobactam (ZOSYN) IVPB 3.375 g       3.375 g 12.5 mL/hr over 240 Minutes Intravenous Every 8 hours 08/19/12 2210     08/19/12 2300   fluconazole (DIFLUCAN) IVPB 400 mg  Status:  Discontinued        400 mg 200 mL/hr over 60 Minutes Intravenous Every 24 hours 08/19/12 2145 08/29/12 1409   08/19/12 2100  piperacillin-tazobactam (ZOSYN) IVPB 3.375 g       3.375 g 12.5 mL/hr over 240 Minutes Intravenous  Once 08/19/12 2014 08/20/12 0048   08/19/12 2000   cefTRIAXone (ROCEPHIN) 2 g in dextrose 5 % 50 mL IVPB        2 g 100 mL/hr over 30 Minutes Intravenous To Emergency Dept 08/19/12 1915 08/19/12 2035          Assessment/Plan: S/p right perihepatic/RLQ fluid coll drainages 10/7, 10/12; check CT 10/18; other plans as per [facility]  LOS: 10 days    ALLRED,D [facility] 08/29/2012

## 2012-08-29 NOTE — Progress Notes (Signed)
Hypoglycemic Event  CBG: 65 @ 0717  Treatment: 15 GM carbohydrate snack  Symptoms: None  Follow-up CBG: Time:0733 CBG Result:79  Possible Reasons for Event: Inadequate meal intake  Comments/MD notified:Dr. Hassell Done notified of hypoglycemic event and no further order given.    [name], [name]  Remember to initiate Hypoglycemia Order Set & complete

## 2012-08-29 NOTE — Progress Notes (Signed)
Patient ID: Janmarie Deubel, female   DOB: 1981-02-14, 31 y.o.   MRN: [id number]    Subjective: Pt feels better.  Had a hypoglycemic episode this morning, but was asymptomatic.   Objective: Vital signs in last 24 hours: Temp:  [98.7 F (37.1 C)-99.4 F (37.4 C)] 98.7 F (37.1 C) (10/16 0608) Pulse Rate:  [101-110] 102  (10/16 0608) Resp:  [20] 20  (10/16 0608) BP: (100-121)/(65-68) 100/67 mmHg (10/16 0608) SpO2:  [93 %-94 %] 94 % (10/16 0608) Last BM Date: 08/28/12  Intake/Output from previous day: 10/15 0701 - 10/16 0700 In: 2610 [P.O.:1320; I.V.:920; IV Piggyback:310] Out: 2876 [Urine:2750; Drains:125; Stool:1] Intake/Output this shift: Total I/O In: 120 [P.O.:120] Out: 0   PE: Abd: soft, less tender, drains 1 and 2 with serosang (scant), drain 3 is with slightly cloudy serous fluid  Lab Results:   Basename 08/29/12 0520 08/27/12 0402  WBC 8.8 9.7  HGB 7.3* 7.1*  HCT 22.9* 21.1*  PLT 686* 670*   BMET No results found for this basename: NA:2,K:2,CL:2,CO2:2,GLUCOSE:2,BUN:2,CREATININE:2,CALCIUM:2 in the last 72 hours PT/INR No results found for this basename: LABPROT:2,INR:2 in the last 72 hours CMP     Component Value Date/Time   NA 135 08/26/2012 0410   K 3.8 08/26/2012 0410   CL 101 08/26/2012 0410   CO2 25 08/26/2012 0410   GLUCOSE 95 08/26/2012 0410   BUN <3* 08/26/2012 0410   CREATININE 0.83 08/26/2012 0410   CALCIUM 8.6 08/26/2012 0410   PROT 6.6 08/20/2012 0332   ALBUMIN 2.3* 08/20/2012 0332   AST 8 08/20/2012 0332   ALT 10 08/20/2012 0332   ALKPHOS 121* 08/20/2012 0332   BILITOT 0.3 08/20/2012 0332   GFRNONAA >90 08/26/2012 0410   GFRAA >90 08/26/2012 0410   Lipase     Component Value Date/Time   LIPASE 19 08/19/2012 1735       Studies/Results: No results found.  Anti-infectives: Anti-infectives     Start     Dose/Rate Route Frequency Ordered Stop   08/20/12 0500  piperacillin-tazobactam (ZOSYN) IVPB 3.375 g       3.375 g 12.5 mL/hr  over 240 Minutes Intravenous Every 8 hours 08/19/12 2210     08/19/12 2300   fluconazole (DIFLUCAN) IVPB 400 mg        400 mg 200 mL/hr over 60 Minutes Intravenous Every 24 hours 08/19/12 2145     08/19/12 2100  piperacillin-tazobactam (ZOSYN) IVPB 3.375 g       3.375 g 12.5 mL/hr over 240 Minutes Intravenous  Once 08/19/12 2014 08/20/12 0048   08/19/12 2000   cefTRIAXone (ROCEPHIN) 2 g in dextrose 5 % 50 mL IVPB        2 g 100 mL/hr over 30 Minutes Intravenous To Emergency Dept 08/19/12 1915 08/19/12 2035           Assessment/Plan  1. Pelvic abscess, s/p perc drain x 3 2. DM  Plan: 1. Repeat CT scan on Friday, cont abx therapy for now 2. Cont diet, will hold oral DM agents and just do SSI right now, while patient not eating much. 3. Cont resource breeze for additional nutrients   LOS: 10 days    Rika Daughdrill E 08/29/2012, 8:59 AM Pager: [phone]

## 2012-08-29 NOTE — Progress Notes (Signed)
Saverio Danker, PA aware on am rounds of low CBG of 65 at 0717 this am later up to 79 at 0733 after 15 gm carb snack. Questioned PA whether or not to give 0800 oral hypoglycemics at that time with instruction to hold. Recently received order via phone to discontinue oral hypoglycemics glucotrol and metformin.

## 2012-08-29 NOTE — Progress Notes (Signed)
Dr. Excell Seltzer and Saverio Danker, PA aware while in report on unit pt's recent CBG 132 requiring 3 Units Novolog. Both aware of hypoglycemic event this am and CBG at lunch was 113. See new verbal orders received.

## 2012-08-29 NOTE — Progress Notes (Signed)
Patient interviewed and examined, agree with PA note above. Daily improvement, for CT later this week Edward Jolly MD, FACS  08/29/2012 2:12 PM

## 2012-08-30 LAB — CULTURE, BLOOD (ROUTINE X 2)
Culture: NO GROWTH
Special Requests: NORMAL

## 2012-08-30 LAB — GLUCOSE, CAPILLARY
Glucose-Capillary: 176 mg/dL — ABNORMAL HIGH (ref 70–99)
Glucose-Capillary: 202 mg/dL — ABNORMAL HIGH (ref 70–99)
Glucose-Capillary: 189 mg/dL — ABNORMAL HIGH (ref 70–99)
Glucose-Capillary: 190 mg/dL — ABNORMAL HIGH (ref 70–99)

## 2012-08-30 NOTE — Progress Notes (Signed)
Patient interviewed and examined, agree with PA note above.  Edward Jolly MD, FACS  08/30/2012 5:35 PM

## 2012-08-30 NOTE — Progress Notes (Signed)
Patient ID: [name], female   DOB: 1981-02-02, 31 y.o.   MRN: [id number]    Subjective: No new complaints.  Trying to eat better.  Objective: Vital signs in last 24 hours: Temp:  [98.1 F (36.7 C)-98.6 F (37 C)] 98.5 F (36.9 C) (10/17 0523) Pulse Rate:  [88-100] 95  (10/17 0523) Resp:  [18-20] 18  (10/17 0523) BP: (103-109)/(61-66) 105/61 mmHg (10/17 0523) SpO2:  [94 %-97 %] 97 % (10/17 0523) Last BM Date: 08/29/12  Intake/Output from previous day: 10/16 0701 - 10/17 0700 In: 2197.5 [P.O.:360; I.V.:1782.5] Out: 2767 [Urine:2750; Drains:17] Intake/Output this shift:    PE: Abd: soft, mildly tender in RLQ around drains.  JP 1,2 with scant serosang drainage.  3 with serous output.  Lab Results:   [facility] 08/29/12 0520  WBC 8.8  HGB 7.3*  HCT 22.9*  PLT 686*   BMET No results found for this basename: NA:2,K:2,CL:2,CO2:2,GLUCOSE:2,BUN:2,CREATININE:2,CALCIUM:2 in the last 72 hours PT/INR No results found for this basename: LABPROT:2,INR:2 in the last 72 hours CMP     Component Value Date/Time   NA 135 08/26/2012 0410   K 3.8 08/26/2012 0410   CL 101 08/26/2012 0410   CO2 25 08/26/2012 0410   GLUCOSE 95 08/26/2012 0410   BUN <3* 08/26/2012 0410   CREATININE 0.83 08/26/2012 0410   CALCIUM 8.6 08/26/2012 0410   PROT 6.6 08/20/2012 0332   ALBUMIN 2.3* 08/20/2012 0332   AST 8 08/20/2012 0332   ALT 10 08/20/2012 0332   ALKPHOS 121* 08/20/2012 0332   BILITOT 0.3 08/20/2012 0332   GFRNONAA >90 08/26/2012 0410   GFRAA >90 08/26/2012 0410   Lipase     Component Value Date/Time   LIPASE 19 08/19/2012 1735       Studies/Results: No results found.  Anti-infectives: Anti-infectives     Start     Dose/Rate Route Frequency Ordered Stop   08/29/12 2200   fluconazole (DIFLUCAN) tablet 400 mg        400 mg Oral Daily at bedtime 08/29/12 1409     08/20/12 0500  piperacillin-tazobactam (ZOSYN) IVPB 3.375 g       3.375 g 12.5 mL/hr over 240 Minutes  Intravenous Every 8 hours 08/19/12 2210     08/19/12 2300   fluconazole (DIFLUCAN) IVPB 400 mg  Status:  Discontinued        400 mg 200 mL/hr over 60 Minutes Intravenous Every 24 hours 08/19/12 2145 08/29/12 1409   08/19/12 2100  piperacillin-tazobactam (ZOSYN) IVPB 3.375 g       3.375 g 12.5 mL/hr over 240 Minutes Intravenous  Once 08/19/12 2014 08/20/12 0048   08/19/12 2000   cefTRIAXone (ROCEPHIN) 2 g in dextrose 5 % 50 mL IVPB        2 g 100 mL/hr over 30 Minutes Intravenous To Emergency Dept 08/19/12 1915 08/19/12 2035           Assessment/Plan  1. Intra-abdominal abscesses, s/p perc drain 2. DM   Plan: 1. Repeat CT scan tomorrow to evaluate these abscesses, hopefully can get rid of some of these drains given their decrease in output. 2. Had to change her SSI yesterday due to hypoglycemia and hold her oral agents.  LOS: 11 days    Hellon Vaccarella E 08/30/2012, 8:53 AM Pager: [phone]

## 2012-08-30 NOTE — Progress Notes (Signed)
Inpatient Diabetes Program Recommendations  AACE/ADA: New Consensus Statement on Inpatient Glycemic Control (2013)  Target Ranges:  Prepandial:   less than 140 mg/dL      Peak postprandial:   less than 180 mg/dL (1-2 hours)      Critically ill patients:  140 - 180 mg/dL   Reason for Visit: Uncontrolled DM  Results for [name], [name] (MRN [id number]) as of 08/30/2012 12:55  08/26/2012 04:10 Sodium: 135 Potassium: 3.8 Chloride: 101 CO2: 25 BUN: <3 (L) Creatinine: 0.83 Calcium: 8.6 GFR calc non Af Amer: >90 GFR calc Af Amer: >90 Glucose: 95  Results for [name], [name] (MRN [id number]) as of 08/30/2012 12:55  Ref. Range 08/20/2012 03:32  Hemoglobin A1C Latest Range: <5.7 % 13.0 (H)    DM type 2 uncontrolled--fair control, no DKA.  Hgb A1c 13.0. Reports compliance with Lantus and oral agents at home.  Hypoglycemia on 10/16 in am.   Recommendations:  Restart metformin 500 bid. Agree with pharmacist to delay restarting glipizide d/t hypoglycemia risks. Increase Novolog to moderate tidwc. Needs OP Diabetes Education consult for uncontrolled DM - HgbA1C of 13.0%.  Will follow.

## 2012-08-31 ENCOUNTER — Inpatient Hospital Stay (HOSPITAL_COMMUNITY): Payer: Medicaid Other

## 2012-08-31 LAB — GLUCOSE, CAPILLARY: Glucose-Capillary: 207 mg/dL — ABNORMAL HIGH (ref 70–99)

## 2012-08-31 LAB — CULTURE, BLOOD (ROUTINE X 2)

## 2012-08-31 MED ORDER — METFORMIN HCL 500 MG PO TABS
500.0000 mg | ORAL_TABLET | Freq: Two times a day (BID) | ORAL | Status: DC
  Administered 2012-08-31 – 2012-09-04 (×8): 500 mg via ORAL
  Filled 2012-08-31 (×10): qty 1

## 2012-08-31 MED ORDER — IOHEXOL 300 MG/ML  SOLN
100.0000 mL | Freq: Once | INTRAMUSCULAR | Status: AC | PRN
  Administered 2012-08-31: 100 mL via INTRAVENOUS

## 2012-08-31 NOTE — Progress Notes (Signed)
Patient ID: [name], female   DOB: 1981/03/30, 31 y.o.   MRN: [id number]    Subjective: Pt looks great.  Happy this morning.  Feels well.  Tolerating diet.  Ready for CT scan  Objective: Vital signs in last 24 hours: Temp:  [98.4 F (36.9 C)-98.6 F (37 C)] 98.6 F (37 C) (10/18 0610) Pulse Rate:  [86-99] 86  (10/18 0610) Resp:  [18] 18  (10/18 0610) BP: (105-119)/(69-78) 112/70 mmHg (10/18 0610) SpO2:  [97 %-98 %] 98 % (10/18 0610) Last BM Date: 08/29/12  Intake/Output from previous day: 10/17 0701 - 10/18 0700 In: 2993.1 [P.O.:760; I.V.:1383.1; IV Piggyback:820] Out: [id number] [Urine:1025; Drains:102] Intake/Output this shift: Total I/O In: 960 [P.O.:960] Out: 0   PE: Abd: soft, much less tender, all drains with minimal output.  +BS  Lab Results:   Basename 08/29/12 0520  WBC 8.8  HGB 7.3*  HCT 22.9*  PLT 686*   BMET No results found for this basename: NA:2,K:2,CL:2,CO2:2,GLUCOSE:2,BUN:2,CREATININE:2,CALCIUM:2 in the last 72 hours PT/INR No results found for this basename: LABPROT:2,INR:2 in the last 72 hours CMP     Component Value Date/Time   NA 135 08/26/2012 0410   K 3.8 08/26/2012 0410   CL 101 08/26/2012 0410   CO2 25 08/26/2012 0410   GLUCOSE 95 08/26/2012 0410   BUN <3* 08/26/2012 0410   CREATININE 0.83 08/26/2012 0410   CALCIUM 8.6 08/26/2012 0410   PROT 6.6 08/20/2012 0332   ALBUMIN 2.3* 08/20/2012 0332   AST 8 08/20/2012 0332   ALT 10 08/20/2012 0332   ALKPHOS 121* 08/20/2012 0332   BILITOT 0.3 08/20/2012 0332   GFRNONAA >90 08/26/2012 0410   GFRAA >90 08/26/2012 0410   Lipase     Component Value Date/Time   LIPASE 19 08/19/2012 1735       Studies/Results: Ct Abdomen Pelvis W Contrast  08/31/2012  *RADIOLOGY REPORT*  Clinical Data: Perforated appendicitis.  Follow-up abdominal and pelvic abscesses.  CT ABDOMEN AND PELVIS WITH CONTRAST  Technique:  Multidetector CT imaging of the abdomen and pelvis was performed following the standard  protocol during bolus administration of intravenous contrast.  Contrast: 199mL OMNIPAQUE IOHEXOL 300 MG/ML  SOLN  Comparison: 08/24/2012  Findings: New percutaneous drainage catheter is now seen within the right pericolic gutter, with significant decrease in size of abscess at this location, currently measuring 1.9 x 5.3 cm compared to 6.7 x 9.3 cm on previous exam.  Two other percutaneous drainage catheters are again seen in the right lower quadrant.  A complex multi locular abscess is again seen in the central pelvis which is decreased in size since previous study, currently measuring approximately 6.1 x 6.5 cm.  A smaller persistent abscess is seen in the inferior right pericolic gutter which measures 2.8 x 5.2 cm and is not significantly changed.  Another fluid collections seen in the right adnexa measures 3.8 x 4.7 cm is also unchanged.  The abdominal parenchymal organs are normal in appearance.  No evidence of hydronephrosis.  Gallbladder is unremarkable.  IMPRESSION:  1.  Near complete resolution of abscess in the superior right pericolic gutter. 2.  Decreased size of 6.5 cm complex multi locular abscess in the central pelvis. 3.  Persistent abscesses in the inferior right pericolic gutter measuring 3 x 5 cm and in the right adnexa measuring 4 x 5 cm which show no significant change.   Original Report Authenticated By: Marlaine Hind, M.D.     Anti-infectives: Anti-infectives     Start  Dose/Rate Route Frequency Ordered Stop   08/29/12 2200   fluconazole (DIFLUCAN) tablet 400 mg        400 mg Oral Daily at bedtime 08/29/12 1409     08/20/12 0500  piperacillin-tazobactam (ZOSYN) IVPB 3.375 g       3.375 g 12.5 mL/hr over 240 Minutes Intravenous Every 8 hours 08/19/12 2210     08/19/12 2300   fluconazole (DIFLUCAN) IVPB 400 mg  Status:  Discontinued        400 mg 200 mL/hr over 60 Minutes Intravenous Every 24 hours 08/19/12 2145 08/29/12 1409   08/19/12 2100  piperacillin-tazobactam (ZOSYN)  IVPB 3.375 g       3.375 g 12.5 mL/hr over 240 Minutes Intravenous  Once 08/19/12 2014 08/20/12 0048   08/19/12 2000   cefTRIAXone (ROCEPHIN) 2 g in dextrose 5 % 50 mL IVPB        2 g 100 mL/hr over 30 Minutes Intravenous To Emergency Dept 08/19/12 1915 08/19/12 2035           Assessment/Plan  1. Pelvic abscesses, s/p perc drain 2. DM  Plan: 1. Will restart metformin today 2. Await CT scan, hopefully we can get some of her drains out today.  If CT looks better then hopefully we can transition to oral abx therapy and plan for dc home within the next 1-2 days.   LOS: 12 days    Deiondre Harrower E 08/31/2012, 10:34 AM Pager: [phone]

## 2012-08-31 NOTE — Progress Notes (Addendum)
ANTIBIOTIC CONSULT NOTE - Follow up  Pharmacy Consult for Zosyn Indication: abdominal abscess, UTI  No Known Allergies  Patient Measurements: Height: 5\' 8"  (172.7 cm) Weight: 259 lb 7.7 oz (117.7 kg) IBW/kg (Calculated) : 63.9   Vital Signs: Temp: 98.3 F (36.8 C) (10/18 0827) Temp src: Oral (10/18 0610) BP: 108/73 mmHg (10/18 0827) Pulse Rate: 86  (10/18 0827) Intake/Output from previous day: 10/17 0701 - 10/18 0700 In: 2993.1 [P.O.:760; I.V.:1383.1; IV Piggyback:820] Out: [id number] [Urine:1025; Drains:102]  Labs:  [facility] 08/29/12 0520  WBC 8.8  HGB 7.3*  PLT 686*  [facility] --  CREATININE --   Estimated Creatinine Clearance: 132.4 ml/min (by C-G formula based on Cr of 0.83).  Microbiology: 10/6 blood x 2 >> ngF 10/6 urine >> Klebsiella (Sens to cefazolin, ceftriaxone, cipro, gent, levo, zosyn, bactrim) 10/7 mrsa pcr >> negative 10/7 Peritoneal cavity NG F 10/7 perineal abscess Group B strep 10/7 perineal anaerobic: abundant bacteroides fragilis 10/11 Blood x 2: NGtd 10/12 abscess: NGF   Assessment:  56 YOF admit 10/6 with abdominal abscesses.  D#13 Zosyn EI and fluconazole 400 IV q24h for pelvic and subhepatic abscesses and Klebsiella UTI (UTI treated). S/P perc drainage x2.  10/7 Perineal abscess culture = Group B Strep.  The anaerobic culture for the perineal abscess resulted yesterday with B.fragilis, Zosyn would cover.  WBC now WNL, patient is afebrile, Scr stable/wnl, CrCl >100 ml/min.  Plan is to repeat CT today and depending on results may remove some of her drains and possibly change to PO abx  CT results, complete resolution of superior pericolic gutter abscess but inferior gutter abscess remains unchanged  Plan:   Continue Zosyn 3.375g IV Q8H infused over 4hrs (Day #13)  Continue to monitor and adjust if necessary  Doreene Eland, PharmD, BCPS.   Pager: [phone] 08/31/2012 11:11 AM

## 2012-08-31 NOTE — Progress Notes (Signed)
Patient interviewed and examined, agree with PA note above.  CT reviewed, shows improvement but not resolution of abscesses.  No new areas, would continue current Rx for now Edward Jolly MD, FACS  08/31/2012 11:31 AM

## 2012-08-31 NOTE — Progress Notes (Signed)
Subjective: Pt doing well; no new c/o; tol diet ok  Objective: Vital signs in last 24 hours: Temp:  [98.3 F (36.8 C)-98.6 F (37 C)] 98.3 F (36.8 C) (10/18 0827) Pulse Rate:  [86-99] 86  (10/18 0827) Resp:  [18-22] 22  (10/18 0827) BP: (105-119)/(69-78) 108/73 mmHg (10/18 0827) SpO2:  [97 %-98 %] 97 % (10/18 0827) Last BM Date: 08/30/12  Intake/Output from previous day: 10/17 0701 - 10/18 0700 In: 2993.1 [P.O.:760; I.V.:1383.1; IV Piggyback:820] Out: [id number] [Urine:1025; Drains:102] Intake/Output this shift: Total I/O In: 960 [P.O.:960] Out: 651 [Urine:650; Drains:1] Rt abd drains intact, outputs avg around 20-40 cc's last 24 hrs   Lab Results:   Basename 08/29/12 0520  WBC 8.8  HGB 7.3*  HCT 22.9*  PLT 686*   BMET No results found for this basename: NA:2,K:2,CL:2,CO2:2,GLUCOSE:2,BUN:2,CREATININE:2,CALCIUM:2 in the last 72 hours PT/INR No results found for this basename: LABPROT:2,INR:2 in the last 72 hours ABG No results found for this basename: PHART:2,PCO2:2,PO2:2,HCO3:2 in the last 72 hours  Studies/Results: Ct Abdomen Pelvis W Contrast  08/31/2012  *RADIOLOGY REPORT*  Clinical Data: Perforated appendicitis.  Follow-up abdominal and pelvic abscesses.  CT ABDOMEN AND PELVIS WITH CONTRAST  Technique:  Multidetector CT imaging of the abdomen and pelvis was performed following the standard protocol during bolus administration of intravenous contrast.  Contrast: 113mL OMNIPAQUE IOHEXOL 300 MG/ML  SOLN  Comparison: 08/24/2012  Findings: New percutaneous drainage catheter is now seen within the right pericolic gutter, with significant decrease in size of abscess at this location, currently measuring 1.9 x 5.3 cm compared to 6.7 x 9.3 cm on previous exam.  Two other percutaneous drainage catheters are again seen in the right lower quadrant.  A complex multi locular abscess is again seen in the central pelvis which is decreased in size since previous study, currently  measuring approximately 6.1 x 6.5 cm.  A smaller persistent abscess is seen in the inferior right pericolic gutter which measures 2.8 x 5.2 cm and is not significantly changed.  Another fluid collections seen in the right adnexa measures 3.8 x 4.7 cm is also unchanged.  The abdominal parenchymal organs are normal in appearance.  No evidence of hydronephrosis.  Gallbladder is unremarkable.  IMPRESSION:  1.  Near complete resolution of abscess in the superior right pericolic gutter. 2.  Decreased size of 6.5 cm complex multi locular abscess in the central pelvis. 3.  Persistent abscesses in the inferior right pericolic gutter measuring 3 x 5 cm and in the right adnexa measuring 4 x 5 cm which show no significant change.   Original Report Authenticated By: Marlaine Hind, M.D.     Anti-infectives: Anti-infectives     Start     Dose/Rate Route Frequency Ordered Stop   08/29/12 2200   fluconazole (DIFLUCAN) tablet 400 mg        400 mg Oral Daily at bedtime 08/29/12 1409     08/20/12 0500   piperacillin-tazobactam (ZOSYN) IVPB 3.375 g        3.375 g 12.5 mL/hr over 240 Minutes Intravenous Every 8 hours 08/19/12 2210     08/19/12 2300   fluconazole (DIFLUCAN) IVPB 400 mg  Status:  Discontinued        400 mg 200 mL/hr over 60 Minutes Intravenous Every 24 hours 08/19/12 2145 08/29/12 1409   08/19/12 2100   piperacillin-tazobactam (ZOSYN) IVPB 3.375 g        3.375 g 12.5 mL/hr over 240 Minutes Intravenous  Once 08/19/12 2014 08/20/12 0048  08/19/12 2000   cefTRIAXone (ROCEPHIN) 2 g in dextrose 5 % 50 mL IVPB        2 g 100 mL/hr over 30 Minutes Intravenous To Emergency Dept 08/19/12 1915 08/19/12 2035          Assessment/Plan: s/p multiple rt abd/pelvic fluid coll drainages 10/7, 10/12; 10/18 CT shows improved but unresolved drained collections. Plan is to cont current tx as per CCS.  LOS: 12 days    Lyndsi Altic,D [facility] 08/31/2012

## 2012-09-01 LAB — GLUCOSE, CAPILLARY
Glucose-Capillary: 162 mg/dL — ABNORMAL HIGH (ref 70–99)
Glucose-Capillary: 234 mg/dL — ABNORMAL HIGH (ref 70–99)
Glucose-Capillary: 196 mg/dL — ABNORMAL HIGH (ref 70–99)

## 2012-09-01 NOTE — Progress Notes (Signed)
Subjective: Reviewed ct results from yesterday, doing ok this am, no emesis, some nausea, having bm, passing flatus  Objective: Vital signs in last 24 hours: Temp:  [98.3 F (36.8 C)-99.4 F (37.4 C)] 98.7 F (37.1 C) (10/19 0540) Pulse Rate:  [86-96] 91  (10/19 0540) Resp:  [16-22] 16  (10/19 0540) BP: (86-110)/(54-73) 110/70 mmHg (10/19 0540) SpO2:  [95 %-100 %] 100 % (10/19 0540) Last BM Date: 08/30/12  Intake/Output from previous day: 10/18 0701 - 10/19 0700 In: 1869.3 [P.O.:1560; I.V.:159.3; IV Piggyback:150] Out: 3966 [Urine:3950; Drains:16] Intake/Output this shift: Total I/O In: 0  Out: 1615 [Urine:1600; Drains:15]  General appearance: no distress Resp: clear to auscultation bilaterally Cardio: regular rate and rhythm GI: 3 drains with serosang fluid rlq, soft, nontender except at drain sites, bs present  Lab Results:  No results found for this basename: WBC:2,HGB:2,HCT:2,PLT:2 in the last 72 hours BMET No results found for this basename: NA:2,K:2,CL:2,CO2:2,GLUCOSE:2,BUN:2,CREATININE:2,CALCIUM:2 in the last 72 hours PT/INR No results found for this basename: LABPROT:2,INR:2 in the last 72 hours ABG No results found for this basename: PHART:2,PCO2:2,PO2:2,HCO3:2 in the last 72 hours  Studies/Results: Ct Abdomen Pelvis W Contrast  08/31/2012  *RADIOLOGY REPORT*  Clinical Data: Perforated appendicitis.  Follow-up abdominal and pelvic abscesses.  CT ABDOMEN AND PELVIS WITH CONTRAST  Technique:  Multidetector CT imaging of the abdomen and pelvis was performed following the standard protocol during bolus administration of intravenous contrast.  Contrast: 162mL OMNIPAQUE IOHEXOL 300 MG/ML  SOLN  Comparison: 08/24/2012  Findings: New percutaneous drainage catheter is now seen within the right pericolic gutter, with significant decrease in size of abscess at this location, currently measuring 1.9 x 5.3 cm compared to 6.7 x 9.3 cm on previous exam.  Two other percutaneous  drainage catheters are again seen in the right lower quadrant.  A complex multi locular abscess is again seen in the central pelvis which is decreased in size since previous study, currently measuring approximately 6.1 x 6.5 cm.  A smaller persistent abscess is seen in the inferior right pericolic gutter which measures 2.8 x 5.2 cm and is not significantly changed.  Another fluid collections seen in the right adnexa measures 3.8 x 4.7 cm is also unchanged.  The abdominal parenchymal organs are normal in appearance.  No evidence of hydronephrosis.  Gallbladder is unremarkable.  IMPRESSION:  1.  Near complete resolution of abscess in the superior right pericolic gutter. 2.  Decreased size of 6.5 cm complex multi locular abscess in the central pelvis. 3.  Persistent abscesses in the inferior right pericolic gutter measuring 3 x 5 cm and in the right adnexa measuring 4 x 5 cm which show no significant change.   Original Report Authenticated By: Marlaine Hind, M.D.     Anti-infectives: Anti-infectives     Start     Dose/Rate Route Frequency Ordered Stop   08/29/12 2200   fluconazole (DIFLUCAN) tablet 400 mg        400 mg Oral Daily at bedtime 08/29/12 1409     08/20/12 0500   piperacillin-tazobactam (ZOSYN) IVPB 3.375 g        3.375 g 12.5 mL/hr over 240 Minutes Intravenous Every 8 hours 08/19/12 2210     08/19/12 2300   fluconazole (DIFLUCAN) IVPB 400 mg  Status:  Discontinued        400 mg 200 mL/hr over 60 Minutes Intravenous Every 24 hours 08/19/12 2145 08/29/12 1409   08/19/12 2100   piperacillin-tazobactam (ZOSYN) IVPB 3.375 g  3.375 g 12.5 mL/hr over 240 Minutes Intravenous  Once 08/19/12 2014 08/20/12 0048   08/19/12 2000   cefTRIAXone (ROCEPHIN) 2 g in dextrose 5 % 50 mL IVPB        2 g 100 mL/hr over 30 Minutes Intravenous To Emergency Dept 08/19/12 1915 08/19/12 2035          Assessment/Plan: Multiple IA abscess Cont perc drains, cont abx, cont regular diet, may be able to  be switched to po abx and go home by early this week but still having some nausea now   LOS: 13 days    [facility] 09/01/2012

## 2012-09-02 LAB — GLUCOSE, CAPILLARY
Glucose-Capillary: 156 mg/dL — ABNORMAL HIGH (ref 70–99)
Glucose-Capillary: 165 mg/dL — ABNORMAL HIGH (ref 70–99)
Glucose-Capillary: 166 mg/dL — ABNORMAL HIGH (ref 70–99)

## 2012-09-02 MED ORDER — DIPHENHYDRAMINE HCL 25 MG PO CAPS
25.0000 mg | ORAL_CAPSULE | Freq: Four times a day (QID) | ORAL | Status: DC | PRN
  Administered 2012-09-02: 25 mg via ORAL
  Filled 2012-09-02: qty 1

## 2012-09-02 NOTE — Progress Notes (Signed)
Patient ID: [name], female   DOB: May 05, 1981, 31 y.o.   MRN: [id number]    Subjective: Some right lower quadrant pain, unchanged, not severe. Also some itching rash on her hands and forearms  Objective: Vital signs in last 24 hours: Temp:  [98 F (36.7 C)-99 F (37.2 C)] 99 F (37.2 C) (10/20 0500) Pulse Rate:  [86-96] 87  (10/20 0500) Resp:  [16-20] 20  (10/20 0500) BP: (105-116)/(69-78) 116/78 mmHg (10/20 0500) SpO2:  [97 %-100 %] 100 % (10/20 0500) Last BM Date: 09/01/12  Intake/Output from previous day: 10/19 0701 - 10/20 0700 In: [id number] [P.O.:24795; I.V.:470] Out: 2629 [Urine:2550; Drains:79] Intake/Output this shift: Total I/O In: 120 [P.O.:120] Out: 321 [Urine:300; Drains:21]  General appearance: alert, cooperative, no distress and morbidly obese GI: abnormal findings:  mild tenderness in the RLQ and drainage catheters in place with cloudy minimal serosanguineous drainage Skin: minimal papular rash over mostly back of right hand and lower forearm  Lab Results:  No results found for this basename: WBC:2,HGB:2,HCT:2,PLT:2 in the last 72 hours BMET No results found for this basename: NA:2,K:2,CL:2,CO2:2,GLUCOSE:2,BUN:2,CREATININE:2,CALCIUM:2 in the last 72 hours   Studies/Results: No results found.  Anti-infectives: Anti-infectives     Start     Dose/Rate Route Frequency Ordered Stop   08/29/12 2200   fluconazole (DIFLUCAN) tablet 400 mg        400 mg Oral Daily at bedtime 08/29/12 1409     08/20/12 0500  piperacillin-tazobactam (ZOSYN) IVPB 3.375 g       3.375 g 12.5 mL/hr over 240 Minutes Intravenous Every 8 hours 08/19/12 2210     08/19/12 2300   fluconazole (DIFLUCAN) IVPB 400 mg  Status:  Discontinued        400 mg 200 mL/hr over 60 Minutes Intravenous Every 24 hours 08/19/12 2145 08/29/12 1409   08/19/12 2100  piperacillin-tazobactam (ZOSYN) IVPB 3.375 g       3.375 g 12.5 mL/hr over 240 Minutes Intravenous  Once 08/19/12 2014 08/20/12 0048     08/19/12 2000   cefTRIAXone (ROCEPHIN) 2 g in dextrose 5 % 50 mL IVPB        2 g 100 mL/hr over 30 Minutes Intravenous To Emergency Dept 08/19/12 1915 08/19/12 2035          Assessment/Plan: Abdominal abscesses, etiology unclear, history of small bowel resection x2 previously in [facility]. Cannot rule out perforated appendicitis. Has been stable/improving with percutaneous drainage and antibiotics. Will need repeat CT this week and hopefully can get home soon on oral antibiotics. Minimal rash question atopic dermatitis. We'll try Benadryl.    LOS: 14 days    Aolani Piggott T 09/02/2012

## 2012-09-02 NOTE — Progress Notes (Addendum)
  Subjective: Pt without new c/o except some mild itching /rash bilateral wrists/forearms; still has intermittent abd pain  Objective: Vital signs in last 24 hours: Temp:  [98 F (36.7 C)-99 F (37.2 C)] 99 F (37.2 C) (10/20 0500) Pulse Rate:  [86-96] 87  (10/20 0500) Resp:  [16-20] 20  (10/20 0500) BP: (105-116)/(69-78) 116/78 mmHg (10/20 0500) SpO2:  [97 %-100 %] 100 % (10/20 0500) Last BM Date: 09/01/12  Intake/Output from previous day: 10/19 0701 - 10/20 0700 In: [id number] [P.O.:24795; I.V.:470] Out: 2629 [Urine:2550; Drains:79] Intake/Output this shift:    Rt abd drains intact, insertion sites ok, mildly tender, outputs less than 40 cc's last 12 hrs  Lab Results:  No results found for this basename: WBC:2,HGB:2,HCT:2,PLT:2 in the last 72 hours BMET No results found for this basename: NA:2,K:2,CL:2,CO2:2,GLUCOSE:2,BUN:2,CREATININE:2,CALCIUM:2 in the last 72 hours PT/INR No results found for this basename: LABPROT:2,INR:2 in the last 72 hours ABG No results found for this basename: PHART:2,PCO2:2,PO2:2,HCO3:2 in the last 72 hours  Studies/Results: No results found.  Anti-infectives: Anti-infectives     Start     Dose/Rate Route Frequency Ordered Stop   08/29/12 2200   fluconazole (DIFLUCAN) tablet 400 mg        400 mg Oral Daily at bedtime 08/29/12 1409     08/20/12 0500  piperacillin-tazobactam (ZOSYN) IVPB 3.375 g       3.375 g 12.5 mL/hr over 240 Minutes Intravenous Every 8 hours 08/19/12 2210     08/19/12 2300   fluconazole (DIFLUCAN) IVPB 400 mg  Status:  Discontinued        400 mg 200 mL/hr over 60 Minutes Intravenous Every 24 hours 08/19/12 2145 08/29/12 1409   08/19/12 2100  piperacillin-tazobactam (ZOSYN) IVPB 3.375 g       3.375 g 12.5 mL/hr over 240 Minutes Intravenous  Once 08/19/12 2014 08/20/12 0048   08/19/12 2000   cefTRIAXone (ROCEPHIN) 2 g in dextrose 5 % 50 mL IVPB        2 g 100 mL/hr over 30 Minutes Intravenous To Emergency Dept 08/19/12  1915 08/19/12 2035          Assessment/Plan: s/p multiple fluid coll drainages 10/7, 10/12; cont current tx; recheck CT once drain outputs less than 10-15 cc's per day; ? trial of po benadryl for itching  LOS: 14 days    Delecia Vastine,D [facility] 09/02/2012

## 2012-09-03 ENCOUNTER — Inpatient Hospital Stay (HOSPITAL_COMMUNITY): Payer: Medicaid Other

## 2012-09-03 LAB — GLUCOSE, CAPILLARY
Glucose-Capillary: 158 mg/dL — ABNORMAL HIGH (ref 70–99)
Glucose-Capillary: 187 mg/dL — ABNORMAL HIGH (ref 70–99)
Glucose-Capillary: 136 mg/dL — ABNORMAL HIGH (ref 70–99)
Glucose-Capillary: 173 mg/dL — ABNORMAL HIGH (ref 70–99)

## 2012-09-03 MED ORDER — IOHEXOL 300 MG/ML  SOLN
100.0000 mL | Freq: Once | INTRAMUSCULAR | Status: AC | PRN
  Administered 2012-09-03: 100 mL via INTRAVENOUS

## 2012-09-03 MED ORDER — AMOXICILLIN-POT CLAVULANATE 875-125 MG PO TABS
1.0000 | ORAL_TABLET | Freq: Two times a day (BID) | ORAL | Status: DC
Start: 2012-09-03 — End: 2012-09-04
  Administered 2012-09-03 – 2012-09-04 (×3): 1 via ORAL
  Filled 2012-09-03 (×4): qty 1

## 2012-09-03 NOTE — Progress Notes (Addendum)
Subjective: Pt ok, feeling good.  Hoping to go home soon CT done earlier today  Objective: Physical Exam: BP 114/72  Pulse 87  Temp 98.9 F (37.2 C) (Oral)  Resp 18  Ht 5\' 8"  (1.727 m)  Wt 259 lb 7.7 oz (117.7 kg)  BMI 39.45 kg/m2  SpO2 96%  LMP 08/13/2012 All drains intact #3 with scant output #1 with cloudy purulence still #2 with serosanguinous output    Labs: CBC No results found for this basename: WBC:2,HGB:2,HCT:2,PLT:2 in the last 72 hours BMET No results found for this basename: NA:2,K:2,CL:2,CO2:2,GLUCOSE:2,BUN:2,CREATININE:2,CALCIUM:2 in the last 72 hours LFT No results found for this basename: PROT,ALBUMIN,AST,ALT,ALKPHOS,BILITOT,BILIDIR,IBILI,LIPASE in the last 72 hours PT/INR No results found for this basename: LABPROT:2,INR:2 in the last 72 hours   Studies/Results: Ct Abdomen Pelvis W Contrast  09/03/2012  *RADIOLOGY REPORT*  Clinical Data:  Follow-up abdominal and pelvic abscesses.  CT ABDOMEN AND PELVIS WITH CONTRAST  Technique:  Multidetector CT imaging of the abdomen and pelvis was performed following the standard protocol during bolus administration of intravenous contrast.  Contrast: 182mL OMNIPAQUE IOHEXOL 300 MG/ML  SOLN  Comparison: 08/31/2012  Findings: Right-sided abdominal and pelvic percutaneous drainage catheters remain in stable position.  Previously seen abscess in the superior right pericolic gutter has nearly completely resolved. Bibasilar atelectasis shows no significant change.  A small fluid collection in the inferior right pericolic gutter is mildly decreased in size, now measuring 2.7 x 4.8 cm compared with 2.8 x 5.2 cm previously.  A multi loculated fluid collection and this is inflammatory changes in the right adnexa shows slight decrease in size, now measuring 5.0 x 6.4 cm compared to 6.1 x 6.5 cm previously.  A thin-walled cyst in the right adnexa posterior to this complex collection shows no significant change in size measuring  approximately 4.1 x 4.5 cm.  This could represent a right ovarian cyst rather than an abscess.  Uterus unremarkable.  No evidence of bowel obstruction.  No soft tissue masses or lymphadenopathy identified.  IMPRESSION:  1.  Mild decrease in size of complex fluid collections in the inferior right pericolic gutter and right adnexa, consistent with abscesses. 2.  Stable 4 x 5 cm right adnexal cyst, which may represent an ovarian cyst rather than abscess. 3.  No new or enlarging fluid collections identified.   Original Report Authenticated By: Marlaine Hind, M.D.     Assessment/Plan: CT reviewed, upper most collection resolved---drain #3 location Right pericolic and pelvic collection remain, but cont to decrease in size- drains #1&2 location. Drain #3 can safely be removed prior to DC if ok with surgery team. Showed pt how to flush drains. Rec daily to every other day flushes with 5-10cc Pt thinks she or her sister will be able to do.    LOS: 15 days    Ascencion Dike PA-C 09/03/2012 1:38 PM  Drain #3 removed without difficulty  3:39 PM

## 2012-09-03 NOTE — Progress Notes (Signed)
Nutrition Follow-up  Intervention:  1. Will continue to provide Resource Breeze BID, provides 500 kcal and 18 grams of protein daily.  2. I have encouraged the patient to continue to have good PO intake.  3. RD to follow for nutrition plan of care.   Assessment:   Patient diet has been advanced from clear liquid to carb modified diet. Patient reported her appetite and intake have improved. PO intake documented 100% at meals. She reported she likes the resource breeze and is consuming them. Per MD, patient with positive tolerance of diet.   Diet Order:  Carb Modified  Meds: Scheduled Meds:   . amoxicillin-clavulanate  1 tablet Oral Q12H  . antiseptic oral rinse  15 mL Mouth Rinse q12n4p  . chlorhexidine  15 mL Mouth Rinse BID  . citalopram  20 mg Oral Daily  . docusate sodium  100 mg Oral Daily  . enoxaparin (LOVENOX) injection  40 mg Subcutaneous Daily  . feeding supplement  1 Container Oral BID BM  . ferrous sulfate  325 mg Oral TID WC  . fluconazole  400 mg Oral QHS  . insulin aspart  0-9 Units Subcutaneous TID WC  . lip balm  1 application Topical BID  . metFORMIN  500 mg Oral BID WC  . sodium chloride  10-40 mL Intracatheter Q12H  . DISCONTD: piperacillin-tazobactam (ZOSYN)  IV  3.375 g Intravenous Q8H   Continuous Infusions:   . 0.45 % NaCl with KCl 20 mEq / L 20 mL/hr at 09/03/12 1029   PRN Meds:.acetaminophen, alum & mag hydroxide-simeth, bisacodyl, dextrose, diphenhydrAMINE, diphenhydrAMINE, diphenhydrAMINE, glucose-Vitamin C, HYDROcodone-acetaminophen, HYDROmorphone (DILAUDID) injection, iohexol, LORazepam, magic mouthwash, ondansetron, ondansetron (ZOFRAN) IV, promethazine, sodium chloride  Labs:  CMP     Component Value Date/Time   NA 135 08/26/2012 0410   K 3.8 08/26/2012 0410   CL 101 08/26/2012 0410   CO2 25 08/26/2012 0410   GLUCOSE 95 08/26/2012 0410   BUN <3* 08/26/2012 0410   CREATININE 0.83 08/26/2012 0410   CALCIUM 8.6 08/26/2012 0410   PROT 6.6  08/20/2012 0332   ALBUMIN 2.3* 08/20/2012 0332   AST 8 08/20/2012 0332   ALT 10 08/20/2012 0332   ALKPHOS 121* 08/20/2012 0332   BILITOT 0.3 08/20/2012 0332   GFRNONAA >90 08/26/2012 0410   GFRAA >90 08/26/2012 0410     Intake/Output Summary (Last 24 hours) at 09/03/12 1406 Last data filed at 09/03/12 1000  Gross per 24 hour  Intake    845 ml  Output 2190.5 ml  Net -1345.5 ml    Weight Status:  259 lb. No new weight on pt.   Re-estimated needs remain the same:  1600-1900 kcal, 130-141 grams protien  Nutrition Dx:  Inadequate oral intake, Ongoing.   Goal:  1. Positive tolerance of diet advancements. -Meeting, continue.  2. PO intake > 50% at meals and supplements. -Meeting, continue.   Monitor:  Diet tolerance, PO intake, weights, labs.   Loyce Dys [facility] [id number]

## 2012-09-03 NOTE — Progress Notes (Signed)
Patient ID: [name], female   DOB: 02/19/1981, 31 y.o.   MRN: [id number]    Subjective: Unchanged mild RLQ pain, itching better, denies n/v, tol diet well  Objective: Vital signs in last 24 hours: Temp:  [97.7 F (36.5 C)-98.9 F (37.2 C)] 98.9 F (37.2 C) (10/21 0553) Pulse Rate:  [87-91] 87  (10/21 0553) Resp:  [18] 18  (10/21 0553) BP: (106-116)/(72-73) 114/72 mmHg (10/21 0553) SpO2:  [95 %-96 %] 96 % (10/21 0553) Last BM Date: 09/01/12  Intake/Output from previous day: 10/20 0701 - 10/21 0700 In: [id number] [P.O.:600; I.V.:490] Out: 1980 [Urine:1910; Drains:70] Intake/Output this shift:    General appearance: alert, cooperative, no distress GI: abnormal findings:  mild tenderness in the RLQ and drains in place with cloudy minimal serosanguineous drainage  Lab Results:  No results found for this basename: WBC:2,HGB:2,HCT:2,PLT:2 in the last 72 hours BMET No results found for this basename: NA:2,K:2,CL:2,CO2:2,GLUCOSE:2,BUN:2,CREATININE:2,CALCIUM:2 in the last 72 hours   Studies/Results: No results found.  Anti-infectives: Anti-infectives     Start     Dose/Rate Route Frequency Ordered Stop   08/29/12 2200   fluconazole (DIFLUCAN) tablet 400 mg        400 mg Oral Daily at bedtime 08/29/12 1409     08/20/12 0500  piperacillin-tazobactam (ZOSYN) IVPB 3.375 g       3.375 g 12.5 mL/hr over 240 Minutes Intravenous Every 8 hours 08/19/12 2210     08/19/12 2300   fluconazole (DIFLUCAN) IVPB 400 mg  Status:  Discontinued        400 mg 200 mL/hr over 60 Minutes Intravenous Every 24 hours 08/19/12 2145 08/29/12 1409   08/19/12 2100  piperacillin-tazobactam (ZOSYN) IVPB 3.375 g       3.375 g 12.5 mL/hr over 240 Minutes Intravenous  Once 08/19/12 2014 08/20/12 0048   08/19/12 2000   cefTRIAXone (ROCEPHIN) 2 g in dextrose 5 % 50 mL IVPB        2 g 100 mL/hr over 30 Minutes Intravenous To Emergency Dept 08/19/12 1915 08/19/12 2035          Assessment/Plan: 1.   Multiple Pelvic abscesses: etiology unclear, history of small bowel resection x2 previously in [facility]. Cannot rule out perforated appendicitis. WBC trended down, culture with no growth but responded to drains and abx.    --repeat CT today  --convert to PO abx  --drain training  --?d/c home today if CT ok    LOS: 15 days    [name] 09/03/2012

## 2012-09-04 LAB — CBC
HCT: 27 % — ABNORMAL LOW (ref 36.0–46.0)
Hemoglobin: 8.7 g/dL — ABNORMAL LOW (ref 12.0–15.0)
MCH: 27.8 pg (ref 26.0–34.0)
MCHC: 32.2 g/dL (ref 30.0–36.0)
MCV: 86.3 fL (ref 78.0–100.0)
Platelets: 574 10*3/uL — ABNORMAL HIGH (ref 150–400)
RBC: 3.13 MIL/uL — ABNORMAL LOW (ref 3.87–5.11)
RDW: 14.1 % (ref 11.5–15.5)
WBC: 7.1 10*3/uL (ref 4.0–10.5)

## 2012-09-04 LAB — GLUCOSE, CAPILLARY: Glucose-Capillary: 161 mg/dL — ABNORMAL HIGH (ref 70–99)

## 2012-09-04 MED ORDER — AMOXICILLIN-POT CLAVULANATE 875-125 MG PO TABS: 1.0000 | ORAL_TABLET | Freq: Two times a day (BID) | ORAL | Status: DC

## 2012-09-04 MED ORDER — HYDROCODONE-ACETAMINOPHEN 5-325 MG PO TABS: 1.0000 | ORAL_TABLET | Freq: Four times a day (QID) | ORAL | Status: DC | PRN

## 2012-09-04 MED ORDER — FLUCONAZOLE 200 MG PO TABS: 400.0000 mg | ORAL_TABLET | Freq: Every day | ORAL | Status: DC

## 2012-09-04 MED ORDER — METFORMIN HCL 500 MG PO TABS: 500.0000 mg | ORAL_TABLET | Freq: Two times a day (BID) | ORAL | Status: DC

## 2012-09-04 NOTE — Care Management Note (Signed)
    Page 1 of 2   09/04/2012     12:29:46 PM   CARE MANAGEMENT NOTE 09/04/2012  Patient:  [name], [name]   Account Number:  [id number]  Date Initiated:  08/20/2012  Documentation initiated by:  DAVIS,RHONDA  Subjective/Objective Assessment:   patient with very complex medical and surgical history, uhc patient in the past, now present with pelvic mass and poss perf. appendix.     Action/Plan:   home  [employer]   Anticipated DC Date:  09/03/2012   Anticipated DC Plan:  [place]  In-house referral  Clinical Social Worker      DC Forensic scientist  CM consult      [facility] Choice  HOME HEALTH   Choice offered to / List presented to:  [name]   DME arranged  NA      DME agency  NA     [place] arranged  HH-1 RN      [place].   Status of service:  Completed, signed off Medicare Important Message given?  NA - LOS <3 / Initial given by admissions (If response is "NO", the following Medicare IM given date fields will be blank) Date Medicare IM given:   Date Additional Medicare IM given:    Discharge Disposition:  [place]  Per UR Regulation:  Reviewed for med. necessity/level of care/duration of stay  If discussed at [place] of Stay Meetings, dates discussed:   08/28/2012    Comments:  09-03-12 [place] 1228 Spoke with patient. Wants to use [facility] for [facility] services. Contacted Manuela Schwartz with [facility], she will follow for d/c needs.  08-28-12 Sunday Spillers RN CM 1500 Spoke with patient at bedside. States she lives at home with sister. Currently does not have a PCP, states Medicaid Social Worker had given her a list PTA and she has not called anyone yet. Stressed the importance of doing this asap. States she has all diabetes supplies and feels comfortable managing this. Worried about how to take care of drains, currently has 3. Provided patient with [facility] listing for [facility] services choice. Will follow  up on patient choice.  [id number] Rosana Hoes, RN, BSN, CCM: CHART REVIEWED AND UPDATED. NO DISCHARGE NEEDS PRESENT AT THIS TIME. CASE MANAGEMENT [phone]   [id number]/Rhonda Rosana Hoes, RN, BSN, CCM: CHART REVIEWED AND UPDATED. NO DISCHARGE NEEDS PRESENT AT THIS TIME. CASE MANAGEMENT [phone]
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
## 2012-09-04 NOTE — Discharge Instructions (Signed)
1.  Empty JP drains daily and record output on piece of paper.  Record each drain separately 2.  Change drain dressings daily with dry gauze 3.  May shower but leave dressings in place and remove wet dressing after shower.  Dry well and replace with dry dressing. 4.  Contact our office if you have not received a date and time for your repeat CT scan and follow up appointment.

## 2012-09-04 NOTE — Progress Notes (Signed)
Patient ID: [name], female   DOB: November 13, 1981, 31 y.o.   MRN: [id number]    Subjective: Unchanged mild RLQ pain, denies n/v, tol diet well, OK with going home today but would like teaching again for drains  Objective: Vital signs in last 24 hours: Temp:  [98.4 F (36.9 C)-99.5 F (37.5 C)] 98.4 F (36.9 C) (10/22 0546) Pulse Rate:  [87-91] 91  (10/22 0546) Resp:  [18-20] 20  (10/22 0546) BP: (108-112)/(70-76) 108/70 mmHg (10/22 0546) SpO2:  [96 %-100 %] 97 % (10/22 0546) Last BM Date: 09/02/12  Intake/Output from previous day: 10/21 0701 - 10/22 0700 In: 980 [P.O.:480; I.V.:480] Out: 2178 [Urine:2150; Drains:28] Intake/Output this shift:   Physical exam: General appearance: alert, cooperative, no distress GI: abnormal findings:  mild tenderness in the RLQ and now only 2 drains in place with cloudy minimal serosanguineous drainage  Lab Results:  No results found for this basename: WBC:2,HGB:2,HCT:2,PLT:2 in the last 72 hours BMET No results found for this basename: NA:2,K:2,CL:2,CO2:2,GLUCOSE:2,BUN:2,CREATININE:2,CALCIUM:2 in the last 72 hours   Studies/Results: Ct Abdomen Pelvis W Contrast  09/03/2012  *RADIOLOGY REPORT*  Clinical Data:  Follow-up abdominal and pelvic abscesses.  CT ABDOMEN AND PELVIS WITH CONTRAST  Technique:  Multidetector CT imaging of the abdomen and pelvis was performed following the standard protocol during bolus administration of intravenous contrast.  Contrast: 159mL OMNIPAQUE IOHEXOL 300 MG/ML  SOLN  Comparison: 08/31/2012  Findings: Right-sided abdominal and pelvic percutaneous drainage catheters remain in stable position.  Previously seen abscess in the superior right pericolic gutter has nearly completely resolved. Bibasilar atelectasis shows no significant change.  A small fluid collection in the inferior right pericolic gutter is mildly decreased in size, now measuring 2.7 x 4.8 cm compared with 2.8 x 5.2 cm previously.  A multi loculated  fluid collection and this is inflammatory changes in the right adnexa shows slight decrease in size, now measuring 5.0 x 6.4 cm compared to 6.1 x 6.5 cm previously.  A thin-walled cyst in the right adnexa posterior to this complex collection shows no significant change in size measuring approximately 4.1 x 4.5 cm.  This could represent a right ovarian cyst rather than an abscess.  Uterus unremarkable.  No evidence of bowel obstruction.  No soft tissue masses or lymphadenopathy identified.  IMPRESSION:  1.  Mild decrease in size of complex fluid collections in the inferior right pericolic gutter and right adnexa, consistent with abscesses. 2.  Stable 4 x 5 cm right adnexal cyst, which may represent an ovarian cyst rather than abscess. 3.  No new or enlarging fluid collections identified.   Original Report Authenticated By: Marlaine Hind, M.D.     Anti-infectives: Anti-infectives     Start     Dose/Rate Route Frequency Ordered Stop   09/03/12 1000   amoxicillin-clavulanate (AUGMENTIN) 875-125 MG per tablet 1 tablet        1 tablet Oral Every 12 hours 09/03/12 0854     08/29/12 2200   fluconazole (DIFLUCAN) tablet 400 mg        400 mg Oral Daily at bedtime 08/29/12 1409     08/20/12 0500   piperacillin-tazobactam (ZOSYN) IVPB 3.375 g  Status:  Discontinued        3.375 g 12.5 mL/hr over 240 Minutes Intravenous Every 8 hours 08/19/12 2210 09/03/12 0854   08/19/12 2300   fluconazole (DIFLUCAN) IVPB 400 mg  Status:  Discontinued        400 mg 200 mL/hr over 60 Minutes  Intravenous Every 24 hours 08/19/12 2145 08/29/12 1409   08/19/12 2100  piperacillin-tazobactam (ZOSYN) IVPB 3.375 g       3.375 g 12.5 mL/hr over 240 Minutes Intravenous  Once 08/19/12 2014 08/20/12 0048   08/19/12 2000   cefTRIAXone (ROCEPHIN) 2 g in dextrose 5 % 50 mL IVPB        2 g 100 mL/hr over 30 Minutes Intravenous To Emergency Dept 08/19/12 1915 08/19/12 2035          Assessment/Plan: 1.  Multiple Pelvic abscesses:  etiology unclear, history of small bowel resection x2 previously in [facility]. Cannot rule out perforated appendicitis. WBC trended down, culture with no growth but responded to drains and abx.    --recheck wbc today  --PO augmentin for 10 days  --f/u in 10 days with CT scan then see Dr. Lucia Gaskins in 2 weeks  --drain training  --d/c home today    LOS: 16 days    WHITE, [name] 09/04/2012

## 2012-09-05 ENCOUNTER — Other Ambulatory Visit (INDEPENDENT_AMBULATORY_CARE_PROVIDER_SITE_OTHER): Payer: Self-pay

## 2012-09-05 DIAGNOSIS — N739 Female pelvic inflammatory disease, unspecified: Secondary | ICD-10-CM

## 2012-09-13 ENCOUNTER — Telehealth (INDEPENDENT_AMBULATORY_CARE_PROVIDER_SITE_OTHER): Payer: Self-pay | Admitting: General Surgery

## 2012-09-13 NOTE — Telephone Encounter (Signed)
[name], nurse with [facility], calling to report this pt not getting much pain relief from the medication she is on currently.  Pt is taking Hydrocodone 2 tabs Q6H.  She is guarding and panting with pain. Home health nurse asking if possible to get stronger pain meds.  Post op appt in 09/18/12.  Please advise.

## 2012-09-14 ENCOUNTER — Telehealth (INDEPENDENT_AMBULATORY_CARE_PROVIDER_SITE_OTHER): Payer: Self-pay

## 2012-09-14 ENCOUNTER — Ambulatory Visit
Admission: RE | Admit: 2012-09-14 | Discharge: 2012-09-14 | Disposition: A | Discharge status: Home / Self Care | Payer: Medicaid Other | Source: Ambulatory Visit | Attending: Surgery | Admitting: Surgery

## 2012-09-14 DIAGNOSIS — N739 Female pelvic inflammatory disease, unspecified: Secondary | ICD-10-CM

## 2012-09-14 MED ORDER — IOHEXOL 300 MG/ML  SOLN
30.0000 mL | Freq: Once | INTRAMUSCULAR | Status: AC | PRN
  Administered 2012-09-14: 30 mL via ORAL

## 2012-09-14 MED ORDER — IOHEXOL 300 MG/ML  SOLN
125.0000 mL | Freq: Once | INTRAMUSCULAR | Status: AC | PRN
  Administered 2012-09-14: 125 mL via INTRAVENOUS

## 2012-09-14 NOTE — Telephone Encounter (Signed)
Informed pt of vicodin 5/325mg  1-2 po Q6hrs Prn # 30 per Dr Lucia Gaskins  was phoned to [place] [phone]

## 2012-09-18 ENCOUNTER — Ambulatory Visit (INDEPENDENT_AMBULATORY_CARE_PROVIDER_SITE_OTHER): Payer: Medicaid Other | Admitting: Surgery

## 2012-09-18 ENCOUNTER — Encounter (INDEPENDENT_AMBULATORY_CARE_PROVIDER_SITE_OTHER): Payer: Self-pay | Admitting: Surgery

## 2012-09-18 VITALS — BP 140/90 | HR 118 | Temp 98.0°F | Resp 20 | Ht 68.0 in | Wt 247.0 lb

## 2012-09-18 DIAGNOSIS — K651 Peritoneal abscess: Secondary | ICD-10-CM

## 2012-09-18 NOTE — Progress Notes (Signed)
[place]  Ann Overall, MD,  [address]  [place], [place]    [place] Phone:  [phone] FAX:  [phone]   Re:   [name] DOB:   13-Nov-1981 MRN:   [id number]  ASSESSMENT AND PLAN: 1. RLQ abscess, multiloculated.  The cause of this current abscess is unclear, though since it is the area of her prior surgery and SB resection, there is probably an association.  It looks like the appendix tracks posterior the inflammatory process.  She has a history of bilateral oophorectomy and SB resection at [facility] in 2010 by [name]. She had a complicated post op period with abscesses.  [name] was involved.   Perc drain - 08/20/2012.  (she has two drains, so I am a little unsure as to when both were placed)  Plan:  I removed one drain today (#2).  I will leave the other drain for 2 weeks.  I gave her a prescription for Septra x 2 weeks (to hopefully sterilize any residual abscess).  I also gave her Vicodin - #30 with 1 refill.  I told her to try to back off of these.  If she is doing well, I will remove the second drain in 2 weeks.  I will repeat a CT scan only if she is not progressing.  I also talked about her returning to [facility] for a second opinion about this whole episode, since I expect this current problem has something to do with her prior surgery.  2. DIABETES MELLITUS, TYPE II, UNCONTROLLED   HgbA1c - 13.0 - 08/20/2012  Now seeing [name] - so she has follow up for her DM.  (She was formerly a patient of [facility]) 3. Obesity (BMI 30-39.9)  BMI - 37.6  4. Anxiety  5. Depression  6. Anemia   Hgb - 8.7 - 09/04/2012   She is taking Fe supplement.  I encouraged her to take a multivit with Fe.  7.History of DVT and filter. The filter was removed at [facility]. 8.  [employer]  Will not accept blood products.  HISTORY OF PRESENT ILLNESS: Chief Complaint  Patient presents with  . Routine Post Op    pelvic abs     [name] is a 31 y.o. (DOB: 03/14/1981)  AA  female who is a patient of [name],[name], MD and comes to me today for follow up of a right lower quadrant abscess.  She was on the [place] service.  I reviewed her CT scan from 09/14/2012 with [name].  This showed a persistant, though small, abscess in the right lower quadrant.  It is probably multiloculated.  She also has an ovarian cyst in the same area.  It appears we can see her appendix (with air) posterior to most of the inflammatory mass (arguing that this is probably not appendicitis).  She has two drains.  She is still having RLQ pain, but not as bad as when she was in the hospital.  She has had one or two episodes of fever (to 101.5).  But has not had trouble the last 5 days.  She is eating well and she has no change in her bowel habits. Her drains are putting out almost nothing.  She has even had trouble irrigating drain # 2.  She is having pain requiring Vicodin.  She has established [name] as a PCP - so hopefully her DM will be better managed.  History  of RLQ abscess: The patient had an ovarian cystectomy at [facility] by [name] on 04/27/2009.  The operation was complicated by a small bowel injury and she required a small bowel resection.  Note, the [facility] op note should be part of the scanned Epic chart.  In the operative note, there was also mention of dense adhesions to the ovarian cysts.  The ovarian cysts were benign.  Her post op course was complicated by multiple admissions for persistent abscesses and wound separation to [facility].  There was concern about a entero-cutaneous fistula, but this was never proven.  She had at least one admission to the [facility] system on 11/02/2010 when she was admitted "drainage of abdominal fistula".    I looks like she was transferred back to [facility] at that time.   Since I do not have all the [facility] records, I am not sure when all of this ended.  It sounds like she did well  until the end of September 2013 when she developed RLQ quadrant pain and nausea.  She was originally seen by [name] at [facility] on 08/19/2012 and last seen by [name] during his LDOW on 09/04/2012.  She comes to me  for follow-up of the right lower quadrant abscesses with two perc drains.  Her last CT scan of the abdomen was 09/14/2012.  PHYSICAL EXAM: BP 140/90  Pulse 118  Temp 98 F (36.7 C) (Oral)  Resp 20  Ht 5\' 8"  (1.727 m)  Wt 247 lb (112.038 kg)  BMI 37.56 kg/m2  LMP 08/12/2012  General: AA obese F who is alert and generally healthy appearing.  HEENT: Normal. Pupils equal.  Neck: Supple. No mass.  No thyroid mass.   Lungs: Clear to auscultation and symmetric breath sounds. Heart:  RRR. No murmur or rub.  Abdomen: Soft. No mass. No tenderness. No hernia. Normal bowel sounds. Well healed lower midline scar.  Two drains coming out the RLQ near the iliac spine, labeled #1 and#2. Rectal: Not done. Extremities:  Good strength and ROM  in upper and lower extremities.  DATA REVIEWED: CT scan reviewed with [name].  Ann Overall, MD, [place] Office:  [phone]

## 2012-09-19 ENCOUNTER — Encounter (INDEPENDENT_AMBULATORY_CARE_PROVIDER_SITE_OTHER): Payer: Medicaid Other | Admitting: Surgery

## 2012-09-27 ENCOUNTER — Telehealth (INDEPENDENT_AMBULATORY_CARE_PROVIDER_SITE_OTHER): Payer: Self-pay

## 2012-09-27 NOTE — Telephone Encounter (Signed)
Spoke with Levada Dy with [facility] ed order for home visits until patients See Dr. Lucia Gaskins 10-04-12 2:30pm- [facility]  number is [phone]

## 2012-10-02 ENCOUNTER — Emergency Department (HOSPITAL_COMMUNITY)
Admission: EM | Admit: 2012-10-02 | Discharge: 2012-10-03 | Disposition: A | Discharge status: Home / Self Care | Payer: Medicaid Other | Attending: Emergency Medicine | Admitting: Emergency Medicine

## 2012-10-02 ENCOUNTER — Telehealth (INDEPENDENT_AMBULATORY_CARE_PROVIDER_SITE_OTHER): Payer: Self-pay | Admitting: General Surgery

## 2012-10-02 ENCOUNTER — Encounter (HOSPITAL_COMMUNITY): Payer: Self-pay | Admitting: *Deleted

## 2012-10-02 ENCOUNTER — Telehealth (INDEPENDENT_AMBULATORY_CARE_PROVIDER_SITE_OTHER): Payer: Self-pay

## 2012-10-02 DIAGNOSIS — R1031 Right lower quadrant pain: Secondary | ICD-10-CM | POA: Insufficient documentation

## 2012-10-02 DIAGNOSIS — R350 Frequency of micturition: Secondary | ICD-10-CM | POA: Insufficient documentation

## 2012-10-02 DIAGNOSIS — IMO0002 Reserved for concepts with insufficient information to code with codable children: Secondary | ICD-10-CM | POA: Insufficient documentation

## 2012-10-02 DIAGNOSIS — E119 Type 2 diabetes mellitus without complications: Secondary | ICD-10-CM | POA: Insufficient documentation

## 2012-10-02 DIAGNOSIS — N83209 Unspecified ovarian cyst, unspecified side: Secondary | ICD-10-CM | POA: Insufficient documentation

## 2012-10-02 DIAGNOSIS — R110 Nausea: Secondary | ICD-10-CM | POA: Insufficient documentation

## 2012-10-02 DIAGNOSIS — Z86718 Personal history of other venous thrombosis and embolism: Secondary | ICD-10-CM | POA: Insufficient documentation

## 2012-10-02 DIAGNOSIS — Z8659 Personal history of other mental and behavioral disorders: Secondary | ICD-10-CM | POA: Insufficient documentation

## 2012-10-02 DIAGNOSIS — R109 Unspecified abdominal pain: Secondary | ICD-10-CM

## 2012-10-02 LAB — URINALYSIS, ROUTINE W REFLEX MICROSCOPIC
Hgb urine dipstick: NEGATIVE
Leukocytes, UA: NEGATIVE
Nitrite: NEGATIVE
Protein, ur: NEGATIVE mg/dL
Specific Gravity, Urine: 1.017 (ref 1.005–1.030)
Urobilinogen, UA: 0.2 mg/dL (ref 0.0–1.0)

## 2012-10-02 LAB — CBC
Hemoglobin: 10.9 g/dL — ABNORMAL LOW (ref 12.0–15.0)
MCHC: 34.2 g/dL (ref 30.0–36.0)
WBC: 9.1 10*3/uL (ref 4.0–10.5)

## 2012-10-02 LAB — GLUCOSE, CAPILLARY: Glucose-Capillary: 208 mg/dL — ABNORMAL HIGH (ref 70–99)

## 2012-10-02 MED ORDER — SODIUM CHLORIDE 0.9 % IV SOLN
Freq: Once | INTRAVENOUS | Status: AC
  Administered 2012-10-02: 20 mL/h via INTRAVENOUS
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
## 2012-10-02 NOTE — Telephone Encounter (Signed)
The pt has called because she ran out of Hydrocodone.  She was told to wait until her next office visit but she can't.  She is having pain at her drain site.  [facility] is using the [facility] on [address].  I told the pt that we are denying this right now until she is seen Thursday and her CBC is drawn.  She didn't know about the blood work yet so I called her nurse Levada Dy at [phone] and checked on it.  Levada Dy is arranging it and someone will go out in the morning and draw this.  We will have it in time for her appointment.  [facility] will be the resulting agency.

## 2012-10-02 NOTE — Telephone Encounter (Signed)
Kerri Epps from [facility] called stating pt c/o increasing pain at drain site. Site clean, dry, no redness,no fever. Temp 98.7. Drainage clear yellow. Reviewed with Dr Lucia Gaskins via phone. Per Dr Lucia Gaskins have [facility] draw CBC with diff and have pt keep appt 11-21. Kerri at [facility] given order for cbc with diff. She states she is still at pts home and will draw lab today since she is not scheduled to come back to pts home tomorrow.

## 2012-10-02 NOTE — Telephone Encounter (Signed)
She called stating she had run out of her pain medicine. I told her if we do not know the patient we would not refill the pain medicine after hours. I told her she could call in the morning and speak with her physician and he can decide if he wanted to refill her pain medicine.

## 2012-10-02 NOTE — ED Notes (Signed)
Pt has jp drain on right side for cyst rupturing and possible others  Pt is taking 2 vicodin every 4 hours for pain without relief.  Also blood sugar has been high,  Home health was at pt's house today

## 2012-10-03 ENCOUNTER — Emergency Department (HOSPITAL_COMMUNITY): Payer: Medicaid Other

## 2012-10-03 LAB — COMPREHENSIVE METABOLIC PANEL
ALT: 14 U/L (ref 0–35)
Albumin: 3.5 g/dL (ref 3.5–5.2)
Alkaline Phosphatase: 102 U/L (ref 39–117)
Chloride: 102 mEq/L (ref 96–112)
Glucose, Bld: 223 mg/dL — ABNORMAL HIGH (ref 70–99)
Potassium: 3.7 mEq/L (ref 3.5–5.1)
Sodium: 138 mEq/L (ref 135–145)
Total Protein: 8 g/dL (ref 6.0–8.3)

## 2012-10-03 MED ORDER — SODIUM CHLORIDE 0.9 % IV BOLUS (SEPSIS)
1000.0000 mL | Freq: Once | INTRAVENOUS | Status: AC
  Administered 2012-10-03: 1000 mL via INTRAVENOUS

## 2012-10-03 MED ORDER — HYDROMORPHONE HCL PF 1 MG/ML IJ SOLN
1.0000 mg | Freq: Once | INTRAMUSCULAR | Status: AC
  Administered 2012-10-03: 1 mg via INTRAVENOUS
  Filled 2012-10-03: qty 1

## 2012-10-03 MED ORDER — ONDANSETRON HCL 4 MG/2ML IJ SOLN
4.0000 mg | Freq: Once | INTRAMUSCULAR | Status: AC
  Administered 2012-10-03: 4 mg via INTRAVENOUS
  Filled 2012-10-03: qty 2

## 2012-10-03 MED ORDER — ONDANSETRON HCL 4 MG PO TABS: 4.0000 mg | ORAL_TABLET | Freq: Four times a day (QID) | ORAL | Status: DC

## 2012-10-03 MED ORDER — IOHEXOL 300 MG/ML  SOLN
100.0000 mL | Freq: Once | INTRAMUSCULAR | Status: AC | PRN
  Administered 2012-10-03: 100 mL via INTRAVENOUS

## 2012-10-03 MED ORDER — HYDROCODONE-ACETAMINOPHEN 5-325 MG PO TABS: 1.0000 | ORAL_TABLET | Freq: Four times a day (QID) | ORAL | Status: DC | PRN

## 2012-10-03 NOTE — ED Provider Notes (Signed)
Medical screening examination/treatment/procedure(s) were performed by non-physician practitioner and as supervising physician I was immediately available for consultation/collaboration.  Teressa Lower, MD 10/03/12 [phone]

## 2012-10-04 ENCOUNTER — Encounter (INDEPENDENT_AMBULATORY_CARE_PROVIDER_SITE_OTHER): Payer: Self-pay | Admitting: Surgery

## 2012-10-04 ENCOUNTER — Ambulatory Visit (INDEPENDENT_AMBULATORY_CARE_PROVIDER_SITE_OTHER): Payer: Medicaid Other | Admitting: Surgery

## 2012-10-04 VITALS — BP 126/82 | HR 84 | Temp 97.0°F | Resp 18 | Ht 68.0 in | Wt 257.0 lb

## 2012-10-04 DIAGNOSIS — K651 Peritoneal abscess: Secondary | ICD-10-CM

## 2012-10-04 NOTE — Progress Notes (Signed)
[place]  Alphonsa Overall, MD,  [address]  [place], [place]    [place] Phone:  [phone] FAX:  [phone]   Re:   [name] DOB:   [birth year] MRN:   [id number]  ASSESSMENT AND PLAN: 1. RLQ abscess, multiloculated.  The cause of this current abscess is unclear, though since it is the area of her prior surgery and SB resection, there is probably an association.  It looks like the appendix tracks posterior the inflammatory process.  She has a history of bilateral oophorectomy and SB resection at [facility] in 2010 by Dr. Thurston Pounds. She had a complicated post op period with abscesses.  Dr. Launa Flight was involved.   Perc drain - 08/20/2012.  Second drain removed today.  Plan:  I think the abscess/infection is in some way related to her surgery/ovarian cysts which were operated on in [facility].  I think she would be best served seeing Dr. Thurston Pounds in [employer], her surgeon there.  I talked to her about this last time, but she has made no plans.  I coped discharge notes and my last note and wrote Dr. Lucianne Lei Le's phone number for her.  She also has not followed up with Dr. Ernie Hew because she said that her "card" had the wrong name on it.  I gave her #30 Vicodin, but told her further pain meds would have to come through Dr. Thurston Pounds or Dr. Ernie Hew.  I made this her last visit with me.  2. DIABETES MELLITUS, TYPE II, UNCONTROLLED   HgbA1c - 13.0 - 08/20/2012  Now seeing Dr. Ernie Hew - so she has follow up for her DM.  (She was formerly a patient of [facility])  I copied her labs for her and noted to her that her BS was 223. 3. Obesity (BMI 30-39.9)  BMI - 37.6  4. Anxiety  5. Depression  6. Anemia   Hgb - 8.7 - 09/04/2012   She is taking Fe supplement.  I encouraged her to take a multivit with Fe.  7.History of DVT and filter. The filter was removed at [facility].  [employer]  Will not accept blood products.  HISTORY OF PRESENT ILLNESS: Chief  Complaint  Patient presents with  . Follow-up    recheck abd drain    [name] is a 31 y.o. (DOB: 12-Dec-1980)  AA  female who is a patient of DEWEY,[name], MD and comes to me today for follow up of a right lower quadrant abscess.  She was on the [place] service.  Since I last saw her, she is still having pain, but more at the stitch that holds the drain than intra-abdominally.  She is eating well.  Has no fever.  And has finished the course of antibiotics (Septra) that I gave her.  I reviewed her CT scan from 09/14/2012 with Dr. Suzy Bouchard.  This showed a persistant, though small, abscess in the right lower quadrant.  It is probably multiloculated.  She also has an ovarian cyst in the same area.  It appears we can see her appendix (with air) posterior to most of the inflammatory mass (arguing that this is probably not appendicitis).  She has two drains.  She is still having RLQ pain, but not as bad as when she was in the hospital.  She has had one or two episodes of fever (to 101.5).  But has not had trouble the last 5 days.  She is eating well and she has no change in her bowel habits. Her drains are putting out almost nothing.  She has even had trouble irrigating drain # 2.  She is having pain requiring Vicodin.  She has established Dr. Ernie Hew as a PCP - so hopefully her DM will be better managed.  History of RLQ abscess: The patient had an ovarian cystectomy at [facility] by Dr. Margaretmary Bayley on 04/27/2009.  The operation was complicated by a small bowel injury and she required a small bowel resection.  Note, the [facility] op note should be part of the scanned Epic chart.  In the operative note, there was also mention of dense adhesions to the ovarian cysts.  The ovarian cysts were benign.  Her post op course was complicated by multiple admissions for persistent abscesses and wound separation to [facility].  There was concern about a entero-cutaneous fistula, but this was never proven.  She  had at least one admission to the [facility] system on 11/02/2010 when she was admitted "drainage of abdominal fistula".    I looks like she was transferred back to [facility] at that time.   Since I do not have all the [facility] records, I am not sure when all of this ended.  It sounds like she did well until the end of September 2013 when she developed RLQ quadrant pain and nausea.  She was originally seen by Dr. Johney Maine at [facility] on 08/19/2012 and last seen by Dr. Excell Seltzer during his LDOW on 09/04/2012.  She comes to me  for follow-up of the right lower quadrant abscesses with two perc drains.  Her last CT scan of the abdomen was 09/14/2012.  PHYSICAL EXAM: BP 126/82  Pulse 84  Temp 97 F (36.1 C) (Temporal)  Resp 18  Ht 5\' 8"  (1.727 m)  Wt 257 lb (116.574 kg)  BMI 39.08 kg/m2  LMP 09/30/2012  General: AA obese F who is alert and generally healthy appearing.  HEENT: Normal. Pupils equal.  Neck: Supple. No mass.  No thyroid mass.   Lungs: Clear to auscultation and symmetric breath sounds. Heart:  RRR. No murmur or rub.  Abdomen: Soft. No mass. No tenderness. No hernia. Normal bowel sounds. Well healed lower midline scar.  One drain coming out the RLQ near the iliac spine.  It has minimal drainage and I removed the drain today. Rectal: Not done. Extremities:  Good strength and ROM  in upper and lower extremities.  DATA REVIEWED: CT scan reviewed with Dr. Suzy Bouchard.  Alphonsa Overall, MD, [place] Office:  [phone]

## 2012-10-05 ENCOUNTER — Telehealth (INDEPENDENT_AMBULATORY_CARE_PROVIDER_SITE_OTHER): Payer: Self-pay | Admitting: General Surgery

## 2012-10-05 NOTE — Telephone Encounter (Signed)
[name] from [facility] called wanting to know if Dr. Lucia Gaskins had pulled the drain in this patient yesterday at her appt.  I informed her that according to his office note he had done this.  She said she would discharge the patient from their services.

## 2019-06-13 ENCOUNTER — Other Ambulatory Visit: Payer: Self-pay

## 2019-06-13 ENCOUNTER — Emergency Department (HOSPITAL_COMMUNITY)
Admission: EM | Admit: 2019-06-13 | Discharge: 2019-06-14 | Disposition: A | Discharge status: Home / Self Care | Payer: Medicaid - Out of State | Attending: Emergency Medicine | Admitting: Emergency Medicine

## 2019-06-13 ENCOUNTER — Encounter (HOSPITAL_COMMUNITY): Payer: Self-pay | Admitting: *Deleted

## 2019-06-13 ENCOUNTER — Emergency Department (HOSPITAL_COMMUNITY): Payer: Medicaid - Out of State

## 2019-06-13 DIAGNOSIS — R112 Nausea with vomiting, unspecified: Secondary | ICD-10-CM | POA: Insufficient documentation

## 2019-06-13 DIAGNOSIS — G8918 Other acute postprocedural pain: Secondary | ICD-10-CM | POA: Diagnosis not present

## 2019-06-13 DIAGNOSIS — E669 Obesity, unspecified: Secondary | ICD-10-CM | POA: Diagnosis not present

## 2019-06-13 DIAGNOSIS — Z9049 Acquired absence of other specified parts of digestive tract: Secondary | ICD-10-CM | POA: Insufficient documentation

## 2019-06-13 DIAGNOSIS — Z7984 Long term (current) use of oral hypoglycemic drugs: Secondary | ICD-10-CM | POA: Insufficient documentation

## 2019-06-13 DIAGNOSIS — Z4802 Encounter for removal of sutures: Secondary | ICD-10-CM | POA: Diagnosis not present

## 2019-06-13 DIAGNOSIS — Z9889 Other specified postprocedural states: Secondary | ICD-10-CM | POA: Diagnosis not present

## 2019-06-13 DIAGNOSIS — D849 Immunodeficiency, unspecified: Secondary | ICD-10-CM | POA: Insufficient documentation

## 2019-06-13 DIAGNOSIS — Z4803 Encounter for change or removal of drains: Secondary | ICD-10-CM | POA: Insufficient documentation

## 2019-06-13 DIAGNOSIS — Z79899 Other long term (current) drug therapy: Secondary | ICD-10-CM | POA: Diagnosis not present

## 2019-06-13 DIAGNOSIS — I10 Essential (primary) hypertension: Secondary | ICD-10-CM | POA: Diagnosis not present

## 2019-06-13 DIAGNOSIS — R0602 Shortness of breath: Secondary | ICD-10-CM | POA: Diagnosis not present

## 2019-06-13 DIAGNOSIS — K5900 Constipation, unspecified: Secondary | ICD-10-CM | POA: Insufficient documentation

## 2019-06-13 DIAGNOSIS — R1084 Generalized abdominal pain: Secondary | ICD-10-CM

## 2019-06-13 DIAGNOSIS — E119 Type 2 diabetes mellitus without complications: Secondary | ICD-10-CM | POA: Insufficient documentation

## 2019-06-13 DIAGNOSIS — R1011 Right upper quadrant pain: Secondary | ICD-10-CM | POA: Diagnosis present

## 2019-06-13 LAB — URINALYSIS, ROUTINE W REFLEX MICROSCOPIC
Bilirubin Urine: NEGATIVE
Glucose, UA: NEGATIVE mg/dL
Ketones, ur: NEGATIVE mg/dL
Nitrite: NEGATIVE
Protein, ur: >=300 mg/dL — AB
Specific Gravity, Urine: 1.014 (ref 1.005–1.030)
pH: 5 (ref 5.0–8.0)

## 2019-06-13 LAB — CBC
HCT: 26.2 % — ABNORMAL LOW (ref 36.0–46.0)
Hemoglobin: 8.6 g/dL — ABNORMAL LOW (ref 12.0–15.0)
MCH: 29.6 pg (ref 26.0–34.0)
MCHC: 32.8 g/dL (ref 30.0–36.0)
MCV: 90 fL (ref 80.0–100.0)
Platelets: 249 10*3/uL (ref 150–400)
RBC: 2.91 MIL/uL — ABNORMAL LOW (ref 3.87–5.11)
RDW: 12.1 % (ref 11.5–15.5)
WBC: 6.3 10*3/uL (ref 4.0–10.5)
nRBC: 0 % (ref 0.0–0.2)

## 2019-06-13 LAB — COMPREHENSIVE METABOLIC PANEL
ALT: 25 U/L (ref 0–44)
AST: 20 U/L (ref 15–41)
Albumin: 2.4 g/dL — ABNORMAL LOW (ref 3.5–5.0)
Alkaline Phosphatase: 82 U/L (ref 38–126)
Anion gap: 8 (ref 5–15)
BUN: 15 mg/dL (ref 6–20)
CO2: 24 mmol/L (ref 22–32)
Calcium: 8.6 mg/dL — ABNORMAL LOW (ref 8.9–10.3)
Chloride: 106 mmol/L (ref 98–111)
Creatinine, Ser: 1.67 mg/dL — ABNORMAL HIGH (ref 0.44–1.00)
GFR calc Af Amer: 45 mL/min — ABNORMAL LOW (ref >60)
GFR calc non Af Amer: 38 mL/min — ABNORMAL LOW (ref >60)
Glucose, Bld: 181 mg/dL — ABNORMAL HIGH (ref 70–99)
Potassium: 4.7 mmol/L (ref 3.5–5.1)
Sodium: 138 mmol/L (ref 135–145)
Total Bilirubin: 0.3 mg/dL (ref 0.3–1.2)
Total Protein: 6.2 g/dL — ABNORMAL LOW (ref 6.5–8.1)

## 2019-06-13 LAB — LIPASE, BLOOD: Lipase: 33 U/L (ref 11–51)

## 2019-06-13 LAB — I-STAT BETA HCG BLOOD, ED (MC, WL, AP ONLY): I-stat hCG, quantitative: <5 m[IU]/mL (ref <5)

## 2019-06-13 MED ORDER — ONDANSETRON HCL 4 MG/2ML IJ SOLN
4.0000 mg | Freq: Once | INTRAMUSCULAR | Status: AC
  Administered 2019-06-13: 4 mg via INTRAVENOUS
  Filled 2019-06-13: qty 2

## 2019-06-13 MED ORDER — SODIUM CHLORIDE 0.9 % IV BOLUS
1000.0000 mL | Freq: Once | INTRAVENOUS | Status: AC
  Administered 2019-06-13: 17:00:00 1000 mL via INTRAVENOUS

## 2019-06-13 MED ORDER — OXYCODONE-ACETAMINOPHEN 5-325 MG PO TABS: 2.0000 | ORAL_TABLET | ORAL | 0 refills | Status: DC | PRN

## 2019-06-13 MED ORDER — SODIUM CHLORIDE 0.9% FLUSH: 3.0000 mL | Freq: Once | INTRAVENOUS | Status: DC

## 2019-06-13 MED ORDER — TECHNETIUM TC 99M MEBROFENIN IV KIT
5.4500 | PACK | Freq: Once | INTRAVENOUS | Status: AC | PRN
  Administered 2019-06-13: 5.45 via INTRAVENOUS

## 2019-06-13 MED ORDER — IOHEXOL 300 MG/ML  SOLN
80.0000 mL | Freq: Once | INTRAMUSCULAR | Status: AC | PRN
  Administered 2019-06-13: 80 mL via INTRAVENOUS

## 2019-06-13 MED ORDER — HYDROMORPHONE HCL 1 MG/ML IJ SOLN
1.0000 mg | Freq: Once | INTRAMUSCULAR | Status: AC
  Administered 2019-06-13: 1 mg via INTRAVENOUS
  Filled 2019-06-13: qty 1
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
## 2019-06-13 NOTE — ED Notes (Signed)
Patient transported to CT 

## 2019-06-13 NOTE — ED Notes (Signed)
Pt transported to nuclear med.  

## 2019-06-13 NOTE — ED Triage Notes (Signed)
Pt in c/o abd pain post gallbladder removal last Friday with d/c from [facility] in [place] this Monday, pt presents with Jpeg drain in place draining serosangenous fluid, pt reports worsening R lower abd, pt reports nausea and vomiting, pt denies fever, pt A&O x4

## 2019-06-14 MED ORDER — OXYCODONE-ACETAMINOPHEN 5-325 MG PO TABS
1.0000 | ORAL_TABLET | Freq: Once | ORAL | Status: AC
  Administered 2019-06-14: 01:00:00 1 via ORAL
  Filled 2019-06-14: qty 1

## 2019-06-14 MED ORDER — LIDOCAINE-EPINEPHRINE (PF) 2 %-1:200000 IJ SOLN
20.0000 mL | Freq: Once | INTRAMUSCULAR | Status: DC
  Filled 2019-06-14: qty 20

## 2019-06-14 NOTE — ED Notes (Signed)
Pt discharged with all belongings. Discharge instructions reviewed with pt, and pt verbalized understanding. Opportunity for questions provided.  

## 2019-06-14 NOTE — Discharge Instructions (Signed)
Call your surgeon tomorrow Please return if worsening

## 2019-07-29 ENCOUNTER — Encounter (HOSPITAL_COMMUNITY): Payer: Self-pay

## 2019-07-29 ENCOUNTER — Emergency Department (HOSPITAL_COMMUNITY): Payer: Medicaid - Out of State

## 2019-07-29 ENCOUNTER — Emergency Department (HOSPITAL_COMMUNITY)
Admission: EM | Admit: 2019-07-29 | Discharge: 2019-07-29 | Disposition: A | Discharge status: Home / Self Care | Payer: Medicaid - Out of State | Attending: Emergency Medicine | Admitting: Emergency Medicine

## 2019-07-29 ENCOUNTER — Other Ambulatory Visit: Payer: Self-pay

## 2019-07-29 DIAGNOSIS — R51 Headache: Secondary | ICD-10-CM | POA: Diagnosis present

## 2019-07-29 DIAGNOSIS — Z79899 Other long term (current) drug therapy: Secondary | ICD-10-CM | POA: Diagnosis not present

## 2019-07-29 DIAGNOSIS — I10 Essential (primary) hypertension: Secondary | ICD-10-CM | POA: Insufficient documentation

## 2019-07-29 DIAGNOSIS — R519 Headache, unspecified: Secondary | ICD-10-CM

## 2019-07-29 DIAGNOSIS — E119 Type 2 diabetes mellitus without complications: Secondary | ICD-10-CM | POA: Insufficient documentation

## 2019-07-29 LAB — POC URINE PREG, ED: Preg Test, Ur: NEGATIVE

## 2019-07-29 MED ORDER — DEXAMETHASONE SODIUM PHOSPHATE 10 MG/ML IJ SOLN
8.0000 mg | Freq: Once | INTRAMUSCULAR | Status: AC
  Administered 2019-07-29: 15:00:00 8 mg via INTRAVENOUS
  Filled 2019-07-29: qty 1

## 2019-07-29 MED ORDER — DIPHENHYDRAMINE HCL 50 MG/ML IJ SOLN
25.0000 mg | Freq: Once | INTRAMUSCULAR | Status: AC
  Administered 2019-07-29: 25 mg via INTRAVENOUS
  Filled 2019-07-29: qty 1

## 2019-07-29 MED ORDER — IBUPROFEN 600 MG PO TABS: 600.0000 mg | ORAL_TABLET | Freq: Four times a day (QID) | ORAL | 0 refills | Status: DC | PRN

## 2019-07-29 MED ORDER — SODIUM CHLORIDE 0.9 % IV BOLUS
1000.0000 mL | Freq: Once | INTRAVENOUS | Status: AC
  Administered 2019-07-29: 15:00:00 1000 mL via INTRAVENOUS

## 2019-07-29 MED ORDER — METOCLOPRAMIDE HCL 5 MG/ML IJ SOLN
10.0000 mg | Freq: Once | INTRAMUSCULAR | Status: AC
  Administered 2019-07-29: 15:00:00 10 mg via INTRAVENOUS
  Filled 2019-07-29: qty 2

## 2019-07-29 MED ORDER — METOCLOPRAMIDE HCL 10 MG PO TABS: 10.0000 mg | ORAL_TABLET | Freq: Four times a day (QID) | ORAL | 0 refills | Status: DC | PRN

## 2019-07-29 MED ORDER — KETOROLAC TROMETHAMINE 30 MG/ML IJ SOLN
30.0000 mg | Freq: Once | INTRAMUSCULAR | Status: AC
  Administered 2019-07-29: 15:00:00 30 mg via INTRAVENOUS
  Filled 2019-07-29: qty 1

## 2019-07-29 MED ORDER — DIPHENHYDRAMINE HCL 25 MG PO TABS: 25.0000 mg | ORAL_TABLET | Freq: Four times a day (QID) | ORAL | 0 refills | Status: DC
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
## 2019-07-29 NOTE — ED Notes (Signed)
Patient transported to radiology

## 2019-07-29 NOTE — ED Notes (Signed)
Patient has ambulated to the restroom for a urine sample.

## 2019-07-29 NOTE — ED Triage Notes (Signed)
Patient  C/o constant migraine x 2 weeks that has been getting progressively worse. Patient c/o sensitivity to light, N/v, and blurred vision. Patient states she has been taking Excedrin, but is not working now.

## 2019-07-29 NOTE — ED Provider Notes (Signed)
Reassess after migraine cocktail. Physical Exam  BP (!) 143/79 (BP Location: Left Arm)   Pulse 96   Temp 98.2 F (36.8 C) (Oral)   Resp 20   Ht 5\' 4"  (1.626 m)   Wt 53.5 kg   LMP 04/24/2019 Comment: negative urine pregnancy test 07-29-2019  SpO2 100%   BMI 20.25 kg/m   Physical Exam  ED Course/Procedures   Clinical Course as of Jul 29 1735  Mon Jul 29, 2019  1727 Patient reports headache is much better.  She is alert nontoxic.  No somnolence or confusion.  Clinically well in appearance.   [MP]    Clinical Course User Index [MP] Charlesetta Shanks, MD    Procedures  MDM   Patient reports that headache is much better after treatment.  She is well in appearance.  She requests a medication to take at home if she has another headache.  Will prescribe prescription for Benadryl, Reglan and ibuprofen.  I have advised patient to schedule follow-up with [facility].  She describes headaches for a long time with a diagnosis of migraines but no formal migraine therapy.  Return precautions reviewed.      Charlesetta Shanks, MD 07/29/19 1737

## 2019-07-29 NOTE — Discharge Instructions (Addendum)
Please read instructions below. You can continue taking over-the-counter medications as needed for headache. Keep an eye on your blood sugars, as the steroid you were given today may cause elevated blood sugar. Schedule an appointment with your primary care provider to follow up on your headache and discuss preventative treatment. Return to the ER for severely worsening headache, vision changes, fever, weakness or numbness, or new or concerning symptoms.

## 2019-07-29 NOTE — ED Notes (Signed)
Attempted to start an IV x 2 but unsuccessful.  

## 2019-08-22 ENCOUNTER — Ambulatory Visit: Payer: Self-pay | Admitting: Family Medicine

## 2019-08-22 ENCOUNTER — Other Ambulatory Visit: Payer: Self-pay

## 2019-08-22 ENCOUNTER — Ambulatory Visit (INDEPENDENT_AMBULATORY_CARE_PROVIDER_SITE_OTHER): Payer: Self-pay | Admitting: Family Medicine

## 2019-08-22 DIAGNOSIS — R61 Generalized hyperhidrosis: Secondary | ICD-10-CM

## 2019-08-22 DIAGNOSIS — G43709 Chronic migraine without aura, not intractable, without status migrainosus: Secondary | ICD-10-CM

## 2019-08-22 MED ORDER — TOPIRAMATE 100 MG PO TABS: 100.0000 mg | ORAL_TABLET | Freq: Two times a day (BID) | ORAL | 1 refills | Status: DC

## 2019-08-22 MED ORDER — METOCLOPRAMIDE HCL 10 MG PO TABS: 10.0000 mg | ORAL_TABLET | Freq: Three times a day (TID) | ORAL | 1 refills | Status: DC | PRN

## 2019-08-22 NOTE — Progress Notes (Signed)
Following up on ED visit for headaches.  States that the headaches have gotten worse. Has a history of migraines but nothing like this. Also having c/o nausea, vomiting, sweats, sensitivity to lights/sounds/smells
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
## 2019-08-27 ENCOUNTER — Telehealth: Payer: Self-pay | Admitting: Family Medicine

## 2019-08-27 NOTE — Telephone Encounter (Signed)
Patient wants to speak to nurse the medication is making act funny and she almost got into the wrong car.

## 2019-08-27 NOTE — Telephone Encounter (Signed)
Patient was given Topamax for her headaches and patient states that the medication is making her fell funny and she does not like how it makes her feel.

## 2019-08-28 MED ORDER — PROPRANOLOL HCL 10 MG PO TABS: 10.0000 mg | ORAL_TABLET | Freq: Two times a day (BID) | ORAL | 0 refills | Status: DC

## 2019-09-03 ENCOUNTER — Other Ambulatory Visit: Payer: Medicaid - Out of State

## 2019-09-03 NOTE — Telephone Encounter (Signed)
Patient has been called and informed of medication being sent to pharmacy.

## 2019-09-10 ENCOUNTER — Other Ambulatory Visit: Payer: Medicaid - Out of State

## 2019-09-13 ENCOUNTER — Other Ambulatory Visit: Payer: Medicaid - Out of State

## 2019-09-18 ENCOUNTER — Telehealth: Payer: Self-pay

## 2019-09-18 LAB — TSH: TSH: 2.36 u[IU]/mL (ref 0.450–4.500)

## 2019-09-18 LAB — QUANTIFERON-TB GOLD PLUS
QuantiFERON Mitogen Value: 5.86 IU/mL
QuantiFERON Nil Value: 0.02 IU/mL
QuantiFERON TB1 Ag Value: 0.02 IU/mL
QuantiFERON TB2 Ag Value: 0.03 IU/mL
QuantiFERON-TB Gold Plus: NEGATIVE

## 2019-09-18 LAB — CBC WITH DIFFERENTIAL/PLATELET
Basophils Absolute: 0.1 10*3/uL (ref 0.0–0.2)
Basos: 1 %
EOS (ABSOLUTE): 0.2 10*3/uL (ref 0.0–0.4)
Eos: 3 %
Hematocrit: 31 % — ABNORMAL LOW (ref 34.0–46.6)
Hemoglobin: 10.1 g/dL — ABNORMAL LOW (ref 11.1–15.9)
Immature Grans (Abs): 0 10*3/uL (ref 0.0–0.1)
Immature Granulocytes: 0 %
Lymphocytes Absolute: 1.9 10*3/uL (ref 0.7–3.1)
Lymphs: 29 %
MCH: 28.7 pg (ref 26.6–33.0)
MCHC: 32.6 g/dL (ref 31.5–35.7)
MCV: 88 fL (ref 79–97)
Monocytes Absolute: 0.4 10*3/uL (ref 0.1–0.9)
Monocytes: 6 %
Neutrophils Absolute: 4 10*3/uL (ref 1.4–7.0)
Neutrophils: 61 %
Platelets: 269 10*3/uL (ref 150–450)
RBC: 3.52 x10E6/uL — ABNORMAL LOW (ref 3.77–5.28)
RDW: 13.1 % (ref 11.7–15.4)
WBC: 6.6 10*3/uL (ref 3.4–10.8)

## 2019-09-18 LAB — T4, FREE: Free T4: 1.05 ng/dL (ref 0.82–1.77)

## 2019-09-18 NOTE — Telephone Encounter (Signed)
Patient was called and a voicemail was left informing patient to return phone call for lab results. 

## 2019-09-18 NOTE — Telephone Encounter (Signed)
-----   Message from Charlott Rakes, MD sent at 09/17/2019  5:17 PM EST ----- Thyroid test is negative, she is anemic but this has improved compared to previous labs.  Continue ferrous sulfate

## 2019-09-19 NOTE — Telephone Encounter (Signed)
Patient called wanting to get her lab results. Please follow up.

## 2019-09-19 NOTE — Progress Notes (Signed)
Patient notified of results & recommendations. Expressed understanding.

## 2019-09-19 NOTE — Telephone Encounter (Signed)
Patient notified of lab results & recommendations. Expressed understanding. 

## 2019-09-23 ENCOUNTER — Other Ambulatory Visit: Payer: Self-pay

## 2019-09-23 ENCOUNTER — Encounter (HOSPITAL_COMMUNITY): Payer: Self-pay | Admitting: Emergency Medicine

## 2019-09-23 ENCOUNTER — Emergency Department (HOSPITAL_COMMUNITY): Payer: Medicaid - Out of State

## 2019-09-23 ENCOUNTER — Ambulatory Visit: Payer: Medicaid - Out of State

## 2019-09-23 ENCOUNTER — Emergency Department (HOSPITAL_COMMUNITY)
Admission: EM | Admit: 2019-09-23 | Discharge: 2019-09-23 | Disposition: A | Discharge status: Home / Self Care | Payer: Medicaid - Out of State | Attending: Emergency Medicine | Admitting: Emergency Medicine

## 2019-09-23 DIAGNOSIS — S99921A Unspecified injury of right foot, initial encounter: Secondary | ICD-10-CM | POA: Diagnosis present

## 2019-09-23 DIAGNOSIS — Y9301 Activity, walking, marching and hiking: Secondary | ICD-10-CM | POA: Insufficient documentation

## 2019-09-23 DIAGNOSIS — W108XXA Fall (on) (from) other stairs and steps, initial encounter: Secondary | ICD-10-CM | POA: Diagnosis not present

## 2019-09-23 DIAGNOSIS — Y92018 Other place in single-family (private) house as the place of occurrence of the external cause: Secondary | ICD-10-CM | POA: Insufficient documentation

## 2019-09-23 DIAGNOSIS — Z794 Long term (current) use of insulin: Secondary | ICD-10-CM | POA: Diagnosis not present

## 2019-09-23 DIAGNOSIS — E119 Type 2 diabetes mellitus without complications: Secondary | ICD-10-CM | POA: Insufficient documentation

## 2019-09-23 DIAGNOSIS — W19XXXA Unspecified fall, initial encounter: Secondary | ICD-10-CM

## 2019-09-23 DIAGNOSIS — Y999 Unspecified external cause status: Secondary | ICD-10-CM | POA: Diagnosis not present

## 2019-09-23 DIAGNOSIS — I10 Essential (primary) hypertension: Secondary | ICD-10-CM | POA: Diagnosis not present

## 2019-09-23 DIAGNOSIS — S92341A Displaced fracture of fourth metatarsal bone, right foot, initial encounter for closed fracture: Secondary | ICD-10-CM | POA: Diagnosis not present

## 2019-09-23 LAB — POC URINE PREG, ED: Preg Test, Ur: NEGATIVE

## 2019-09-23 MED ORDER — LIDOCAINE HCL (PF) 1 % IJ SOLN
10.0000 mL | Freq: Once | INTRAMUSCULAR | Status: AC
  Administered 2019-09-23: 10 mL
  Filled 2019-09-23: qty 30

## 2019-09-23 MED ORDER — CEPHALEXIN 500 MG PO CAPS: 500.0000 mg | ORAL_CAPSULE | Freq: Four times a day (QID) | ORAL | 0 refills | Status: DC

## 2019-09-23 MED ORDER — IBUPROFEN 200 MG PO TABS
600.0000 mg | ORAL_TABLET | Freq: Once | ORAL | Status: AC
  Administered 2019-09-23: 600 mg via ORAL
  Filled 2019-09-23: qty 3

## 2019-09-23 NOTE — Discharge Instructions (Signed)
Please follow up with podiatry regarding the fracture in your 4th toe. Wear post op shoe when you walk to help decrease bending of the toes. Continue taping toes together to help isolate the toe in place. You can take Ibuprofen 600 mg every 6-8 hours and 1,000 mg Tylenol every 8 hours as needed for the pain. When you are at home elevate foot and ice it to reduce inflammation.   I have prescribed antibiotics for you to take if you begin noticing any signs of infection on the abrasions on your toes given you are a diabetic.   Follow up with your PCP regarding your ED visit as well.

## 2019-09-23 NOTE — ED Triage Notes (Signed)
Patient here from home reporting fall today down stairs. Complains of left arm pain from wrist up to shoulder and right leg pain from knee down to foot. Able to move all extremities.

## 2019-09-23 NOTE — ED Notes (Signed)
Patient in radiology
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
## 2019-09-24 ENCOUNTER — Ambulatory Visit: Payer: Medicaid - Out of State | Attending: Family Medicine | Admitting: Family Medicine

## 2019-09-24 DIAGNOSIS — Z794 Long term (current) use of insulin: Secondary | ICD-10-CM

## 2019-09-24 DIAGNOSIS — G43109 Migraine with aura, not intractable, without status migrainosus: Secondary | ICD-10-CM | POA: Diagnosis not present

## 2019-09-24 DIAGNOSIS — G43909 Migraine, unspecified, not intractable, without status migrainosus: Secondary | ICD-10-CM | POA: Insufficient documentation

## 2019-09-24 DIAGNOSIS — E1165 Type 2 diabetes mellitus with hyperglycemia: Secondary | ICD-10-CM

## 2019-09-24 DIAGNOSIS — R61 Generalized hyperhidrosis: Secondary | ICD-10-CM | POA: Diagnosis not present

## 2019-09-24 MED ORDER — CLONIDINE HCL 0.1 MG PO TABS: 0.1000 mg | ORAL_TABLET | Freq: Every day | ORAL | 3 refills | Status: DC

## 2019-09-24 MED ORDER — AMITRIPTYLINE HCL 10 MG PO TABS: 10.0000 mg | ORAL_TABLET | Freq: Every day | ORAL | 1 refills | Status: DC

## 2019-09-24 MED ORDER — ALBUTEROL SULFATE HFA 108 (90 BASE) MCG/ACT IN AERS: 1.0000 | INHALATION_SPRAY | Freq: Four times a day (QID) | RESPIRATORY_TRACT | 1 refills | Status: DC | PRN

## 2019-09-24 NOTE — Progress Notes (Signed)
Patient has been called and DOB has been verified. Patient has been screened and transferred to PCP to start phone visit.     
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
## 2019-10-28 ENCOUNTER — Encounter: Payer: Self-pay | Admitting: Family Medicine

## 2019-10-28 ENCOUNTER — Other Ambulatory Visit: Payer: Self-pay

## 2019-10-28 ENCOUNTER — Ambulatory Visit: Payer: Self-pay | Attending: Family Medicine | Admitting: Family Medicine

## 2019-10-28 DIAGNOSIS — E1165 Type 2 diabetes mellitus with hyperglycemia: Secondary | ICD-10-CM

## 2019-10-28 DIAGNOSIS — G43109 Migraine with aura, not intractable, without status migrainosus: Secondary | ICD-10-CM

## 2019-10-28 DIAGNOSIS — Z794 Long term (current) use of insulin: Secondary | ICD-10-CM

## 2019-10-28 MED ORDER — PROPRANOLOL HCL 10 MG PO TABS: 10.0000 mg | ORAL_TABLET | Freq: Two times a day (BID) | ORAL | 3 refills | Status: DC

## 2019-10-28 MED ORDER — AMITRIPTYLINE HCL 10 MG PO TABS: 10.0000 mg | ORAL_TABLET | Freq: Every day | ORAL | 3 refills | Status: DC

## 2019-10-28 NOTE — Progress Notes (Signed)
Patient has been called and DOB has been verified. Patient has been screened and transferred to PCP to start phone visit.     
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
## 2019-10-29 ENCOUNTER — Ambulatory Visit: Payer: Medicaid - Out of State | Attending: Family Medicine

## 2019-10-29 ENCOUNTER — Other Ambulatory Visit: Payer: Self-pay

## 2019-10-29 DIAGNOSIS — E1165 Type 2 diabetes mellitus with hyperglycemia: Secondary | ICD-10-CM

## 2019-10-29 DIAGNOSIS — Z794 Long term (current) use of insulin: Secondary | ICD-10-CM

## 2019-10-30 ENCOUNTER — Other Ambulatory Visit: Payer: Self-pay | Admitting: Family Medicine

## 2019-10-30 DIAGNOSIS — N1832 Chronic kidney disease, stage 3b: Secondary | ICD-10-CM

## 2019-10-30 LAB — MICROALBUMIN / CREATININE URINE RATIO
Creatinine, Urine: 86.9 mg/dL
Microalb/Creat Ratio: 5384 mg/g creat — ABNORMAL HIGH (ref 0–29)
Microalbumin, Urine: 4679 ug/mL

## 2019-10-30 LAB — LIPID PANEL
Chol/HDL Ratio: 5.6 ratio — ABNORMAL HIGH (ref 0.0–4.4)
Cholesterol, Total: 291 mg/dL — ABNORMAL HIGH (ref 100–199)
HDL: 52 mg/dL (ref >39)
LDL Chol Calc (NIH): 212 mg/dL — ABNORMAL HIGH (ref 0–99)
Triglycerides: 148 mg/dL (ref 0–149)
VLDL Cholesterol Cal: 27 mg/dL (ref 5–40)

## 2019-10-30 LAB — CMP14+EGFR
ALT: 14 IU/L (ref 0–32)
AST: 15 IU/L (ref 0–40)
Albumin/Globulin Ratio: 1.2 (ref 1.2–2.2)
Albumin: 3.6 g/dL — ABNORMAL LOW (ref 3.8–4.8)
Alkaline Phosphatase: 108 IU/L (ref 39–117)
BUN/Creatinine Ratio: 9 (ref 9–23)
BUN: 20 mg/dL (ref 6–20)
Bilirubin Total: 0.2 mg/dL (ref 0.0–1.2)
CO2: 22 mmol/L (ref 20–29)
Calcium: 9.4 mg/dL (ref 8.7–10.2)
Chloride: 108 mmol/L — ABNORMAL HIGH (ref 96–106)
Creatinine, Ser: 2.14 mg/dL — ABNORMAL HIGH (ref 0.57–1.00)
GFR calc Af Amer: 33 mL/min/{1.73_m2} — ABNORMAL LOW (ref >59)
GFR calc non Af Amer: 29 mL/min/{1.73_m2} — ABNORMAL LOW (ref >59)
Globulin, Total: 3.1 g/dL (ref 1.5–4.5)
Glucose: 159 mg/dL — ABNORMAL HIGH (ref 65–99)
Potassium: 4.5 mmol/L (ref 3.5–5.2)
Sodium: 143 mmol/L (ref 134–144)
Total Protein: 6.7 g/dL (ref 6.0–8.5)

## 2019-10-30 LAB — HEMOGLOBIN A1C
Est. average glucose Bld gHb Est-mCnc: 157 mg/dL
Hgb A1c MFr Bld: 7.1 % — ABNORMAL HIGH (ref 4.8–5.6)

## 2019-10-30 MED ORDER — ATORVASTATIN CALCIUM 20 MG PO TABS: 20.0000 mg | ORAL_TABLET | Freq: Every day | ORAL | 3 refills | Status: DC

## 2019-10-30 MED ORDER — NOVOLOG MIX 70/30 FLEXPEN (70-30) 100 UNIT/ML ~~LOC~~ SUPN: 35.0000 [IU] | PEN_INJECTOR | SUBCUTANEOUS | 3 refills | Status: DC

## 2019-11-01 ENCOUNTER — Telehealth: Payer: Self-pay

## 2019-11-01 NOTE — Telephone Encounter (Signed)
-----   Message from Charlott Rakes, MD sent at 10/30/2019  7:25 PM EST ----- A1c is normal at 7.1, she is doing a good job with her Diabetes, continue current regimen. Cholesterol is elevated, I have sent a prescription for Lipitor to her Pharmacy; advised to adhere to a low cholesterol diet. Kidney function is abnormal and I have sent a referral to Nephrology; avoid NSAIDS.

## 2019-11-01 NOTE — Telephone Encounter (Signed)
Patient was called and informed of lab results and referral being placed for kidney doctor.

## 2019-12-10 ENCOUNTER — Telehealth: Payer: Self-pay | Admitting: Family Medicine

## 2019-12-10 NOTE — Telephone Encounter (Signed)
Patient called and requested for pcp to send something to her Pharmacy for flu symptoms. Please fu at

## 2019-12-10 NOTE — Telephone Encounter (Signed)
Patient was called and a voicemail was left informing patient to return phone call. 

## 2019-12-11 ENCOUNTER — Emergency Department (HOSPITAL_COMMUNITY)
Admission: EM | Admit: 2019-12-11 | Discharge: 2019-12-11 | Disposition: A | Discharge status: Home / Self Care | Payer: HRSA Program | Attending: Emergency Medicine | Admitting: Emergency Medicine

## 2019-12-11 ENCOUNTER — Other Ambulatory Visit: Payer: Self-pay

## 2019-12-11 ENCOUNTER — Encounter (HOSPITAL_COMMUNITY): Payer: Self-pay | Admitting: Emergency Medicine

## 2019-12-11 ENCOUNTER — Emergency Department (HOSPITAL_COMMUNITY): Payer: HRSA Program

## 2019-12-11 DIAGNOSIS — R509 Fever, unspecified: Secondary | ICD-10-CM | POA: Diagnosis present

## 2019-12-11 DIAGNOSIS — Z794 Long term (current) use of insulin: Secondary | ICD-10-CM | POA: Insufficient documentation

## 2019-12-11 DIAGNOSIS — Z7982 Long term (current) use of aspirin: Secondary | ICD-10-CM | POA: Insufficient documentation

## 2019-12-11 DIAGNOSIS — N179 Acute kidney failure, unspecified: Secondary | ICD-10-CM

## 2019-12-11 DIAGNOSIS — I10 Essential (primary) hypertension: Secondary | ICD-10-CM | POA: Insufficient documentation

## 2019-12-11 DIAGNOSIS — N12 Tubulo-interstitial nephritis, not specified as acute or chronic: Secondary | ICD-10-CM | POA: Insufficient documentation

## 2019-12-11 DIAGNOSIS — U071 COVID-19: Secondary | ICD-10-CM | POA: Insufficient documentation

## 2019-12-11 DIAGNOSIS — E119 Type 2 diabetes mellitus without complications: Secondary | ICD-10-CM | POA: Diagnosis not present

## 2019-12-11 DIAGNOSIS — Z79899 Other long term (current) drug therapy: Secondary | ICD-10-CM | POA: Insufficient documentation

## 2019-12-11 LAB — URINALYSIS, ROUTINE W REFLEX MICROSCOPIC
Bilirubin Urine: NEGATIVE
Glucose, UA: NEGATIVE mg/dL
Ketones, ur: NEGATIVE mg/dL
Nitrite: NEGATIVE
Protein, ur: >=300 mg/dL — AB
Specific Gravity, Urine: 1.012 (ref 1.005–1.030)
WBC, UA: >50 WBC/hpf — ABNORMAL HIGH (ref 0–5)
pH: 5 (ref 5.0–8.0)

## 2019-12-11 LAB — CBC WITH DIFFERENTIAL/PLATELET
Abs Immature Granulocytes: 0.02 10*3/uL (ref 0.00–0.07)
Basophils Absolute: 0 10*3/uL (ref 0.0–0.1)
Basophils Relative: 0 %
Eosinophils Absolute: 0.1 10*3/uL (ref 0.0–0.5)
Eosinophils Relative: 2 %
HCT: 28.8 % — ABNORMAL LOW (ref 36.0–46.0)
Hemoglobin: 9.4 g/dL — ABNORMAL LOW (ref 12.0–15.0)
Immature Granulocytes: 0 %
Lymphocytes Relative: 16 %
Lymphs Abs: 1 10*3/uL (ref 0.7–4.0)
MCH: 30.3 pg (ref 26.0–34.0)
MCHC: 32.6 g/dL (ref 30.0–36.0)
MCV: 92.9 fL (ref 80.0–100.0)
Monocytes Absolute: 0.5 10*3/uL (ref 0.1–1.0)
Monocytes Relative: 8 %
Neutro Abs: 4.6 10*3/uL (ref 1.7–7.7)
Neutrophils Relative %: 74 %
Platelets: 195 10*3/uL (ref 150–400)
RBC: 3.1 MIL/uL — ABNORMAL LOW (ref 3.87–5.11)
RDW: 12.8 % (ref 11.5–15.5)
WBC: 6.3 10*3/uL (ref 4.0–10.5)
nRBC: 0 % (ref 0.0–0.2)

## 2019-12-11 LAB — CBG MONITORING, ED
Glucose-Capillary: 167 mg/dL — ABNORMAL HIGH (ref 70–99)
Glucose-Capillary: 175 mg/dL — ABNORMAL HIGH (ref 70–99)

## 2019-12-11 LAB — COMPREHENSIVE METABOLIC PANEL
ALT: 19 U/L (ref 0–44)
AST: 20 U/L (ref 15–41)
Albumin: 2.6 g/dL — ABNORMAL LOW (ref 3.5–5.0)
Alkaline Phosphatase: 73 U/L (ref 38–126)
Anion gap: 10 (ref 5–15)
BUN: 23 mg/dL — ABNORMAL HIGH (ref 6–20)
CO2: 23 mmol/L (ref 22–32)
Calcium: 8.2 mg/dL — ABNORMAL LOW (ref 8.9–10.3)
Chloride: 107 mmol/L (ref 98–111)
Creatinine, Ser: 2.73 mg/dL — ABNORMAL HIGH (ref 0.44–1.00)
GFR calc Af Amer: 25 mL/min — ABNORMAL LOW (ref >60)
GFR calc non Af Amer: 21 mL/min — ABNORMAL LOW (ref >60)
Glucose, Bld: 207 mg/dL — ABNORMAL HIGH (ref 70–99)
Potassium: 4.2 mmol/L (ref 3.5–5.1)
Sodium: 140 mmol/L (ref 135–145)
Total Bilirubin: 0.6 mg/dL (ref 0.3–1.2)
Total Protein: 6.8 g/dL (ref 6.5–8.1)

## 2019-12-11 LAB — PREGNANCY, URINE: Preg Test, Ur: NEGATIVE

## 2019-12-11 LAB — POC SARS CORONAVIRUS 2 AG -  ED: SARS Coronavirus 2 Ag: POSITIVE — AB

## 2019-12-11 LAB — CK: Total CK: 268 U/L — ABNORMAL HIGH (ref 38–234)

## 2019-12-11 LAB — FIBRINOGEN: Fibrinogen: 724 mg/dL — ABNORMAL HIGH (ref 210–475)

## 2019-12-11 LAB — LIPASE, BLOOD: Lipase: 23 U/L (ref 11–51)

## 2019-12-11 LAB — PROCALCITONIN: Procalcitonin: <0.1 ng/mL

## 2019-12-11 LAB — TRIGLYCERIDES: Triglycerides: 167 mg/dL — ABNORMAL HIGH (ref <150)

## 2019-12-11 LAB — SARS CORONAVIRUS 2 (TAT 6-24 HRS): SARS Coronavirus 2: POSITIVE — AB

## 2019-12-11 LAB — FERRITIN: Ferritin: 654 ng/mL — ABNORMAL HIGH (ref 11–307)

## 2019-12-11 LAB — C-REACTIVE PROTEIN: CRP: 3.4 mg/dL — ABNORMAL HIGH (ref <1.0)

## 2019-12-11 LAB — LACTATE DEHYDROGENASE: LDH: 214 U/L — ABNORMAL HIGH (ref 98–192)

## 2019-12-11 LAB — D-DIMER, QUANTITATIVE: D-Dimer, Quant: 1.27 ug/mL-FEU — ABNORMAL HIGH (ref 0.00–0.50)

## 2019-12-11 MED ORDER — CEPHALEXIN 500 MG PO CAPS: 500.0000 mg | ORAL_CAPSULE | Freq: Two times a day (BID) | ORAL | 0 refills | Status: AC

## 2019-12-11 MED ORDER — LACTATED RINGERS IV BOLUS
1000.0000 mL | Freq: Once | INTRAVENOUS | Status: AC
  Administered 2019-12-11: 11:00:00 1000 mL via INTRAVENOUS

## 2019-12-11 MED ORDER — METOCLOPRAMIDE HCL 5 MG/ML IJ SOLN
10.0000 mg | Freq: Once | INTRAMUSCULAR | Status: AC
  Administered 2019-12-11: 09:00:00 10 mg via INTRAVENOUS
  Filled 2019-12-11: qty 2

## 2019-12-11 MED ORDER — LACTATED RINGERS IV BOLUS
1000.0000 mL | Freq: Once | INTRAVENOUS | Status: AC
  Administered 2019-12-11: 09:00:00 1000 mL via INTRAVENOUS

## 2019-12-11 MED ORDER — SODIUM CHLORIDE 0.9 % IV SOLN
1.0000 g | Freq: Once | INTRAVENOUS | Status: AC
  Administered 2019-12-11: 1 g via INTRAVENOUS
  Filled 2019-12-11: qty 10

## 2019-12-11 MED ORDER — HYDROMORPHONE HCL 1 MG/ML IJ SOLN
1.0000 mg | Freq: Once | INTRAMUSCULAR | Status: AC
  Administered 2019-12-11: 09:00:00 1 mg via INTRAVENOUS
  Filled 2019-12-11: qty 1

## 2019-12-11 MED ORDER — ONDANSETRON 4 MG PO TBDP: 4.0000 mg | ORAL_TABLET | Freq: Three times a day (TID) | ORAL | 0 refills | Status: DC | PRN

## 2019-12-11 MED ORDER — ONDANSETRON HCL 4 MG/2ML IJ SOLN: 4.0000 mg | Freq: Once | INTRAMUSCULAR | Status: DC

## 2019-12-11 MED ORDER — ACETAMINOPHEN 325 MG PO TABS
650.0000 mg | ORAL_TABLET | Freq: Once | ORAL | Status: AC
  Administered 2019-12-11: 650 mg via ORAL
  Filled 2019-12-11: qty 2
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
## 2019-12-11 NOTE — Discharge Instructions (Addendum)
Be sure to drink plenty of fluids. You need to have your kidney function rechecked by your primary care doctor in 1 week or less. Use tylenol for fever and pain, do NOT use ibuprofen, advil, aleve, etc because of your kidneys  If you develop worsening, continued, or recurrent abdominal pain, uncontrolled vomiting, fever, chest or back pain, or any other new/concerning symptoms then return to the ER for evaluation.

## 2019-12-11 NOTE — Progress Notes (Signed)
CSW received call from [facility] who reports patient was requesting assistance to find a place to stay after testing positive for COVID. CSW spoke with patient via phone. Patient reports she lives with her sister, brother, and nephew. She reports she does not want to give them COVID. She reports she has a room, but it has no door and to go to the bathroom she has to go through a common area. CSW called the [facility] who runs the [place] to see if this was an option. CSW spoke with [place] who reports because patient is not homeless she is not qualified for the program. CSW informed patient of this and recommended she isolate as much as she can in the home, wear a mask at all times, and wear gloves if possible. Patient reports she believes her family is getting tested today. CSW informed RN that patient will have to return home. No other social work needs noted, [place] signing off.   Ann Circle, LCSW Transitions of Care Department [facility] ED [phone]

## 2019-12-11 NOTE — ED Notes (Signed)
Patient attempted to give urine sample after first bolus, not successful. Patient given PO fluids and second bolus, will continue to try for a urine sample.

## 2019-12-11 NOTE — Progress Notes (Signed)
TOC CM spoke to pt and states she can afford her medications. Her Keflex is $8.33 and provided goodrx coupon for Zofra $20 at [place]. Her PCP is Dr Margarita Rana. [place], [place] ED TOC CM [phone]

## 2019-12-11 NOTE — ED Triage Notes (Signed)
Arrives C/C of fever, nausea, emesis and hyperglycemia since Monday, with progressive worsening. Patient stated she took tylenol and Delsym at 7am this morning. Has not been tested for COVID, no known exposure, endorses compliance with insulin and diabetes medication. Reports CBG 392 at home.

## 2019-12-12 ENCOUNTER — Telehealth: Payer: Self-pay | Admitting: Adult Health

## 2019-12-12 NOTE — Telephone Encounter (Signed)
Patient was seen in the ED on 12/11/2019

## 2019-12-12 NOTE — Telephone Encounter (Signed)
Called patient to discuss COVID-19 positive test results.  Patient is in high risk category for potential complications from [id number].  Call discuss possible treatment options with monoclonal antibody infusion.  Unable to reach.  Message was left to return call at infusion center  Evlyn Amason NP-C  [facility]   12/12/2019

## 2019-12-13 ENCOUNTER — Telehealth (HOSPITAL_COMMUNITY): Payer: Self-pay

## 2019-12-13 LAB — URINE CULTURE: Culture: >=100000 — AB

## 2019-12-14 NOTE — Progress Notes (Signed)
ED Antimicrobial Stewardship Positive Culture Follow Up   [name] is an 39 y.o. female who presented to [facility] on 12/11/2019 with a chief complaint of flu-like symptoms.   Recent Results (from the past 720 hour(s))  Urine culture     Status: Abnormal   Collection Time: 12/11/19  9:15 AM   Specimen: Urine, Clean Catch  Result Value Ref Range Status   Specimen Description   Final    URINE, CLEAN CATCH Performed at [facility], [place] [address] [place], [place] [address]    Special Requests   Final    NONE Performed at [facility], [place] [address], [place], [place] [address]    Culture (A)  Final    >=100,000 COLONIES/mL KLEBSIELLA PNEUMONIAE Confirmed Extended Spectrum Beta-Lactamase Producer (ESBL).  In bloodstream infections from ESBL organisms, carbapenems are preferred over piperacillin/tazobactam. They are shown to have a lower risk of mortality.    Report Status 12/13/2019 FINAL  Final   Organism ID, Bacteria KLEBSIELLA PNEUMONIAE (A)  Final      Susceptibility   Klebsiella pneumoniae - MIC*    AMPICILLIN >=32 RESISTANT Resistant     CEFAZOLIN >=64 RESISTANT Resistant     CEFTRIAXONE >=64 RESISTANT Resistant     CIPROFLOXACIN 1 SENSITIVE Sensitive     GENTAMICIN <=1 SENSITIVE Sensitive     IMIPENEM <=0.25 SENSITIVE Sensitive     NITROFURANTOIN 128 RESISTANT Resistant     TRIMETH/SULFA >=320 RESISTANT Resistant     AMPICILLIN/SULBACTAM >=32 RESISTANT Resistant     PIP/TAZO 32 INTERMEDIATE Intermediate     * >=100,000 COLONIES/mL KLEBSIELLA PNEUMONIAE  SARS CORONAVIRUS 2 (TAT 6-24 HRS) Nasopharyngeal Nasopharyngeal Swab     Status: Abnormal   Collection Time: 12/11/19  1:26 PM   Specimen: Nasopharyngeal Swab  Result Value Ref Range Status   SARS Coronavirus 2 POSITIVE (A) NEGATIVE Final    Comment: Emailed to L.[place]erd[place] 1840 [id number] I.Manning (NOTE) SARS-CoV-2 target nucleic acids are DETECTED. The SARS-CoV-2 RNA is  generally detectable in upper and lower respiratory specimens during the acute phase of infection. Positive results are indicative of the presence of SARS-CoV-2 RNA. Clinical correlation with patient history and other diagnostic information is  necessary to determine patient infection status. Positive results do not rule out bacterial infection or co-infection with other viruses.  The expected result is Negative. Fact Sheet for Patients: SugarRoll.be Fact Sheet for Healthcare Providers: https://www.woods-mathews.com/ This test is not yet approved or cleared by the [place] FDA and  has been authorized for detection and/or diagnosis of SARS-CoV-2 by FDA under an Emergency Use Authorization (EUA). This EUA will remain  in effect (meaning this test can be used) for the duration of the COVID-19 declaration unde r Section 564(b)(1) of the Act, 21 U.S.C. section 360bbb-3(b)(1), unless the authorization is terminated or revoked sooner. Performed at [facility], [place] [address], [place],  [address]    Found to be COVID+ with possible pyelo as well. Growing ESBL on UCx. [x]  Treated with Keflex, organism resistant to prescribed antimicrobial   New antibiotic prescription: Ciprofloxacin 500 mg PO daily x 7 days (#14)  ED Provider: Benedetto Goad, PA-C   Joleena Weisenburger A 12/14/2019, 1:28 PM Clinical Pharmacist [phone]

## 2019-12-15 ENCOUNTER — Telehealth: Payer: Self-pay | Admitting: Emergency Medicine

## 2019-12-15 NOTE — Telephone Encounter (Signed)
Post ED Visit - Positive Culture Follow-up: Unsuccessful Patient Follow-up  Culture assessed and recommendations reviewed by:  []  Elenor Quinones, Pharm.D. []  Heide Guile, Pharm.D., BCPS AQ-ID []  Parks Neptune, Pharm.D., BCPS []  Alycia Rossetti, Pharm.D., BCPS []  [place], [place].D., BCPS, AAHIVP []  Legrand Como, Pharm.D., BCPS, AAHIVP [x]  Reuel Boom, PharmD []  Vincenza Hews, PharmD, BCPS  Positive urine culture  []  Patient discharged without antimicrobial prescription and treatment is now indicated [x]  Organism is resistant to prescribed ED discharge antimicrobial []  Patient with positive blood cultures   Unable to contact patient by phone, letter will be sent to address on file  Plan: stop Keflex, start Cipro 500 mg PO daily x seven days - [name]  [name] 12/15/2019, 4:17 PM

## 2019-12-16 ENCOUNTER — Encounter: Payer: Self-pay | Admitting: Internal Medicine

## 2019-12-30 ENCOUNTER — Other Ambulatory Visit: Payer: Self-pay

## 2019-12-30 ENCOUNTER — Encounter: Payer: Self-pay | Admitting: Family Medicine

## 2019-12-30 ENCOUNTER — Ambulatory Visit: Payer: HRSA Program | Attending: Family Medicine | Admitting: Family Medicine

## 2019-12-30 DIAGNOSIS — M25552 Pain in left hip: Secondary | ICD-10-CM

## 2019-12-30 DIAGNOSIS — R0602 Shortness of breath: Secondary | ICD-10-CM

## 2019-12-30 DIAGNOSIS — U071 COVID-19: Secondary | ICD-10-CM | POA: Diagnosis not present

## 2019-12-30 DIAGNOSIS — R112 Nausea with vomiting, unspecified: Secondary | ICD-10-CM
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
## 2019-12-30 NOTE — Progress Notes (Signed)
Patient verified DOB Patient has eaten today and taken medication tod.ay Patient complains of left side hip pain radiating up the back and shoulder. Hip area is a sharp pain and cramps N/V present this morning, no diarrhea, no fevers since home.  Patients taste and smell is returning. CBG this morning before eating was 115.

## 2020-01-07 MED ORDER — ONDANSETRON 4 MG PO TBDP: 4.0000 mg | ORAL_TABLET | Freq: Three times a day (TID) | ORAL | 0 refills | Status: DC | PRN

## 2020-02-13 ENCOUNTER — Telehealth: Payer: Self-pay | Admitting: Family Medicine

## 2020-02-13 DIAGNOSIS — R112 Nausea with vomiting, unspecified: Secondary | ICD-10-CM

## 2020-02-13 DIAGNOSIS — R61 Generalized hyperhidrosis: Secondary | ICD-10-CM

## 2020-02-13 DIAGNOSIS — G43109 Migraine with aura, not intractable, without status migrainosus: Secondary | ICD-10-CM

## 2020-02-13 MED ORDER — AMITRIPTYLINE HCL 10 MG PO TABS: 10.0000 mg | ORAL_TABLET | Freq: Every day | ORAL | 0 refills | Status: DC

## 2020-02-13 MED ORDER — ALBUTEROL SULFATE HFA 108 (90 BASE) MCG/ACT IN AERS: 1.0000 | INHALATION_SPRAY | Freq: Four times a day (QID) | RESPIRATORY_TRACT | 0 refills | Status: DC | PRN

## 2020-02-13 MED ORDER — NOVOLOG MIX 70/30 FLEXPEN (70-30) 100 UNIT/ML ~~LOC~~ SUPN: 35.0000 [IU] | PEN_INJECTOR | Freq: Every day | SUBCUTANEOUS | 0 refills | Status: DC

## 2020-02-13 MED ORDER — CLONIDINE HCL 0.1 MG PO TABS: 0.1000 mg | ORAL_TABLET | Freq: Every day | ORAL | 0 refills | Status: DC

## 2020-02-13 MED ORDER — PROPRANOLOL HCL 10 MG PO TABS: 10.0000 mg | ORAL_TABLET | Freq: Two times a day (BID) | ORAL | 0 refills | Status: DC

## 2020-02-13 MED ORDER — ONDANSETRON 4 MG PO TBDP: 4.0000 mg | ORAL_TABLET | Freq: Three times a day (TID) | ORAL | 0 refills | Status: DC | PRN

## 2020-02-13 MED ORDER — ATORVASTATIN CALCIUM 20 MG PO TABS: 20.0000 mg | ORAL_TABLET | Freq: Every day | ORAL | 0 refills | Status: DC

## 2020-02-13 NOTE — Telephone Encounter (Signed)
Patient called requesting for all her current medicationss [facility] in [place] .

## 2020-02-13 NOTE — Telephone Encounter (Signed)
Prescriptions sent

## 2020-02-18 ENCOUNTER — Ambulatory Visit: Payer: Self-pay | Admitting: Family Medicine

## 2020-02-27 ENCOUNTER — Encounter: Payer: Self-pay | Admitting: Family Medicine

## 2020-03-19 ENCOUNTER — Ambulatory Visit (HOSPITAL_COMMUNITY)
Admission: EM | Admit: 2020-03-19 | Discharge: 2020-03-19 | Disposition: A | Discharge status: Home / Self Care | Payer: Self-pay | Attending: Emergency Medicine | Admitting: Emergency Medicine

## 2020-03-19 ENCOUNTER — Encounter (HOSPITAL_COMMUNITY): Payer: Self-pay

## 2020-03-19 ENCOUNTER — Other Ambulatory Visit: Payer: Self-pay

## 2020-03-19 DIAGNOSIS — N390 Urinary tract infection, site not specified: Secondary | ICD-10-CM | POA: Insufficient documentation

## 2020-03-19 DIAGNOSIS — R350 Frequency of micturition: Secondary | ICD-10-CM | POA: Insufficient documentation

## 2020-03-19 LAB — POCT URINALYSIS DIP (DEVICE)
Bilirubin Urine: NEGATIVE
Glucose, UA: NEGATIVE mg/dL
Ketones, ur: NEGATIVE mg/dL
Nitrite: NEGATIVE
Protein, ur: >=300 mg/dL — AB
Specific Gravity, Urine: 1.025 (ref 1.005–1.030)
Urobilinogen, UA: 0.2 mg/dL (ref 0.0–1.0)
pH: 6.5 (ref 5.0–8.0)

## 2020-03-19 MED ORDER — PHENAZOPYRIDINE HCL 200 MG PO TABS: 200.0000 mg | ORAL_TABLET | Freq: Three times a day (TID) | ORAL | 0 refills | Status: DC

## 2020-03-19 MED ORDER — CEPHALEXIN 500 MG PO CAPS: 500.0000 mg | ORAL_CAPSULE | Freq: Four times a day (QID) | ORAL | 0 refills | Status: DC

## 2020-03-19 NOTE — ED Triage Notes (Signed)
Pt is here with vaginal odor, burning that started 2 days ago. Pt has not taken anything to relieve discomfort.
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
## 2020-03-19 NOTE — Discharge Instructions (Addendum)
Stay hydrated  Avoid dark or sugar drinks  Take full dose of medications

## 2020-03-21 LAB — URINE CULTURE: Culture: >=100000 — AB

## 2020-03-24 ENCOUNTER — Telehealth (HOSPITAL_COMMUNITY): Payer: Self-pay

## 2020-03-24 MED ORDER — CIPROFLOXACIN HCL 500 MG PO TABS: 500.0000 mg | ORAL_TABLET | Freq: Two times a day (BID) | ORAL | 0 refills | Status: AC

## 2020-03-30 ENCOUNTER — Other Ambulatory Visit: Payer: Self-pay | Admitting: Pharmacist

## 2020-03-30 ENCOUNTER — Other Ambulatory Visit: Payer: Self-pay | Admitting: Family Medicine

## 2020-03-30 DIAGNOSIS — R61 Generalized hyperhidrosis: Secondary | ICD-10-CM

## 2020-03-30 DIAGNOSIS — G43109 Migraine with aura, not intractable, without status migrainosus: Secondary | ICD-10-CM

## 2020-03-30 DIAGNOSIS — R112 Nausea with vomiting, unspecified: Secondary | ICD-10-CM

## 2020-03-30 MED ORDER — ATORVASTATIN CALCIUM 20 MG PO TABS: 20.0000 mg | ORAL_TABLET | Freq: Every day | ORAL | 0 refills | Status: AC

## 2020-03-30 MED ORDER — ONDANSETRON 4 MG PO TBDP: 4.0000 mg | ORAL_TABLET | Freq: Three times a day (TID) | ORAL | 0 refills | Status: DC | PRN

## 2020-03-30 MED ORDER — NOVOLOG MIX 70/30 FLEXPEN (70-30) 100 UNIT/ML ~~LOC~~ SUPN: 35.0000 [IU] | PEN_INJECTOR | Freq: Every day | SUBCUTANEOUS | 0 refills | Status: DC

## 2020-03-30 MED ORDER — ALBUTEROL SULFATE HFA 108 (90 BASE) MCG/ACT IN AERS: 1.0000 | INHALATION_SPRAY | Freq: Four times a day (QID) | RESPIRATORY_TRACT | 0 refills | Status: DC | PRN

## 2020-03-30 MED ORDER — PROPRANOLOL HCL 10 MG PO TABS: 10.0000 mg | ORAL_TABLET | Freq: Two times a day (BID) | ORAL | 0 refills | Status: DC

## 2020-03-30 MED ORDER — CLONIDINE HCL 0.1 MG PO TABS: 0.1000 mg | ORAL_TABLET | Freq: Every day | ORAL | 0 refills | Status: DC

## 2020-04-27 ENCOUNTER — Ambulatory Visit: Payer: Self-pay | Admitting: Internal Medicine
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
## 2020-05-04 ENCOUNTER — Ambulatory Visit: Payer: Medicaid Other | Admitting: Family Medicine

## 2020-05-04 ENCOUNTER — Telehealth: Payer: Medicaid Other | Admitting: Family

## 2020-05-04 DIAGNOSIS — J069 Acute upper respiratory infection, unspecified: Secondary | ICD-10-CM

## 2020-05-04 MED ORDER — BENZONATATE 200 MG PO CAPS
200.0000 mg | ORAL_CAPSULE | Freq: Two times a day (BID) | ORAL | 0 refills | Status: DC | PRN
Start: 2020-05-04 — End: 2020-09-04

## 2020-05-04 MED ORDER — FLUTICASONE PROPIONATE 50 MCG/ACT NA SUSP: 2.0000 | Freq: Every day | NASAL | 6 refills | Status: AC

## 2020-05-04 NOTE — Progress Notes (Signed)
We are sorry you are not feeling well.  Here is how we plan to help!  Based on what you have shared with me, it looks like you may have a viral upper respiratory infection.  Upper respiratory infections are caused by a large number of viruses; however, rhinovirus is the most common cause.   Symptoms vary from person to person, with common symptoms including sore throat, cough, fatigue or lack of energy and feeling of general discomfort.  A low-grade fever of up to 100.4 may present, but is often uncommon.  Symptoms vary however, and are closely related to a person's age or underlying illnesses.  The most common symptoms associated with an upper respiratory infection are nasal discharge or congestion, cough, sneezing, headache and pressure in the ears and face.  These symptoms usually persist for about 3 to 10 days, but can last up to 2 weeks.  It is important to know that upper respiratory infections do not cause serious illness or complications in most cases.    Upper respiratory infections can be transmitted from person to person, with the most common method of transmission being a person's hands.  The virus is able to live on the skin and can infect other persons for up to 2 hours after direct contact.  Also, these can be transmitted when someone coughs or sneezes; thus, it is important to cover the mouth to reduce this risk.  To keep the spread of the illness at bay, good hand hygiene is very important.  This is an infection that is most likely caused by a virus. There are no specific treatments other than to help you with the symptoms until the infection runs its course.  We are sorry you are not feeling well.  Here is how we plan to help!   For nasal congestion, you may use an oral decongestants such as Mucinex D or if you have glaucoma or high blood pressure use plain Mucinex.  Saline nasal spray or nasal drops can help and can safely be used as often as needed for congestion.  For your congestion,  I have prescribed Fluticasone nasal spray one spray in each nostril twice a day  If you do not have a history of heart disease, hypertension, diabetes or thyroid disease, prostate/bladder issues or glaucoma, you may also use Sudafed to treat nasal congestion.  It is highly recommended that you consult with a pharmacist or your primary care physician to ensure this medication is safe for you to take.     If you have a cough, you may use cough suppressants such as Delsym and Robitussin.  If you have glaucoma or high blood pressure, you can also use Coricidin HBP.   For cough I have prescribed for you A prescription cough medication called Tessalon Perles 100 mg. You may take 1-2 capsules every 8 hours as needed for cough  If you have a sore or scratchy throat, use a saltwater gargle-  to  teaspoon of salt dissolved in a 4-ounce to 8-ounce glass of warm water.  Gargle the solution for approximately 15-30 seconds and then spit.  It is important not to swallow the solution.  You can also use throat lozenges/cough drops and Chloraseptic spray to help with throat pain or discomfort.  Warm or cold liquids can also be helpful in relieving throat pain.  For headache, pain or general discomfort, you can use Ibuprofen or Tylenol as directed.   Some authorities believe that zinc sprays or the use of   Echinacea may shorten the course of your symptoms.   HOME CARE . Only take medications as instructed by your medical team. . Be sure to drink plenty of fluids. Water is fine as well as fruit juices, sodas and electrolyte beverages. You may want to stay away from caffeine or alcohol. If you are nauseated, try taking small sips of liquids. How do you know if you are getting enough fluid? Your urine should be a pale yellow or almost colorless. . Get rest. . Taking a steamy shower or using a humidifier may help nasal congestion and ease sore throat pain. You can place a towel over your head and breathe in the steam  from hot water coming from a faucet. . Using a saline nasal spray works much the same way. . Cough drops, hard candies and sore throat lozenges may ease your cough. . Avoid close contacts especially the very young and the elderly . Cover your mouth if you cough or sneeze . Always remember to wash your hands.   GET HELP RIGHT AWAY IF: . You develop worsening fever. . If your symptoms do not improve within 10 days . You develop yellow or green discharge from your nose over 3 days. . You have coughing fits . You develop a severe head ache or visual changes. . You develop shortness of breath, difficulty breathing or start having chest pain . Your symptoms persist after you have completed your treatment plan  MAKE SURE YOU   Understand these instructions.  Will watch your condition.  Will get help right away if you are not doing well or get worse.  Your e-visit answers were reviewed by a board certified advanced clinical practitioner to complete your personal care plan. Depending upon the condition, your plan could have included both over the counter or prescription medications. Please review your pharmacy choice. If there is a problem, you may call our nursing hot line at and have the prescription routed to another pharmacy. Your safety is important to [place]. If you have drug allergies check your prescription carefully.   You can use MyChart to ask questions about today's visit, request a non-urgent call back, or ask for a work or school excuse for 24 hours related to this e-Visit. If it has been greater than 24 hours you will need to follow up with your provider, or enter a new e-Visit to address those concerns. You will get an e-mail in the next two days asking about your experience.  I hope that your e-visit has been valuable and will speed your recovery. Thank you for using e-visits.     Greater than 5 minutes, yet less than 10 minutes of time have been spent researching, coordinating,  and implementing care for this patient today.  Thank you for the details you included in the comment boxes. Those details are very helpful in determining the best course of treatment for you and help [place] to provide the best care.

## 2020-06-08 ENCOUNTER — Other Ambulatory Visit: Payer: Self-pay

## 2020-06-08 ENCOUNTER — Emergency Department (HOSPITAL_COMMUNITY)
Admission: EM | Admit: 2020-06-08 | Discharge: 2020-06-08 | Disposition: A | Discharge status: Home / Self Care | Payer: BC Managed Care – PPO | Source: Home / Self Care

## 2020-06-08 ENCOUNTER — Encounter (HOSPITAL_COMMUNITY): Payer: Self-pay | Admitting: Emergency Medicine

## 2020-06-08 DIAGNOSIS — A419 Sepsis, unspecified organism: Secondary | ICD-10-CM | POA: Diagnosis not present

## 2020-06-08 DIAGNOSIS — N939 Abnormal uterine and vaginal bleeding, unspecified: Secondary | ICD-10-CM | POA: Insufficient documentation

## 2020-06-08 DIAGNOSIS — R1031 Right lower quadrant pain: Secondary | ICD-10-CM | POA: Insufficient documentation

## 2020-06-08 DIAGNOSIS — N12 Tubulo-interstitial nephritis, not specified as acute or chronic: Secondary | ICD-10-CM | POA: Diagnosis not present

## 2020-06-08 DIAGNOSIS — Z5321 Procedure and treatment not carried out due to patient leaving prior to being seen by health care provider: Secondary | ICD-10-CM | POA: Insufficient documentation

## 2020-06-08 LAB — COMPREHENSIVE METABOLIC PANEL
ALT: 15 U/L (ref 0–44)
AST: 11 U/L — ABNORMAL LOW (ref 15–41)
Albumin: 2.2 g/dL — ABNORMAL LOW (ref 3.5–5.0)
Alkaline Phosphatase: 79 U/L (ref 38–126)
Anion gap: 13 (ref 5–15)
BUN: 27 mg/dL — ABNORMAL HIGH (ref 6–20)
CO2: 22 mmol/L (ref 22–32)
Calcium: 8.4 mg/dL — ABNORMAL LOW (ref 8.9–10.3)
Chloride: 97 mmol/L — ABNORMAL LOW (ref 98–111)
Creatinine, Ser: 3.63 mg/dL — ABNORMAL HIGH (ref 0.44–1.00)
GFR calc Af Amer: 17 mL/min — ABNORMAL LOW (ref >60)
GFR calc non Af Amer: 15 mL/min — ABNORMAL LOW (ref >60)
Glucose, Bld: 545 mg/dL (ref 70–99)
Potassium: 4.6 mmol/L (ref 3.5–5.1)
Sodium: 132 mmol/L — ABNORMAL LOW (ref 135–145)
Total Bilirubin: 0.3 mg/dL (ref 0.3–1.2)
Total Protein: 6.9 g/dL (ref 6.5–8.1)

## 2020-06-08 LAB — I-STAT BETA HCG BLOOD, ED (MC, WL, AP ONLY): I-stat hCG, quantitative: 12.9 m[IU]/mL — ABNORMAL HIGH (ref <5)

## 2020-06-08 LAB — CBC
HCT: 27.2 % — ABNORMAL LOW (ref 36.0–46.0)
Hemoglobin: 9 g/dL — ABNORMAL LOW (ref 12.0–15.0)
MCH: 29.4 pg (ref 26.0–34.0)
MCHC: 33.1 g/dL (ref 30.0–36.0)
MCV: 88.9 fL (ref 80.0–100.0)
Platelets: 281 10*3/uL (ref 150–400)
RBC: 3.06 MIL/uL — ABNORMAL LOW (ref 3.87–5.11)
RDW: 12.1 % (ref 11.5–15.5)
WBC: 11.3 10*3/uL — ABNORMAL HIGH (ref 4.0–10.5)
nRBC: 0 % (ref 0.0–0.2)

## 2020-06-08 LAB — LIPASE, BLOOD: Lipase: 27 U/L (ref 11–51)

## 2020-06-08 MED ORDER — SODIUM CHLORIDE 0.9% FLUSH: 3.0000 mL | Freq: Once | INTRAVENOUS | Status: DC

## 2020-06-08 NOTE — ED Notes (Signed)
Pt eloped from waiting area. Called 3X.  

## 2020-06-08 NOTE — ED Notes (Signed)
Called for vitals. No response 

## 2020-06-08 NOTE — ED Triage Notes (Signed)
Pt complaint of lower abdominal pain with vaginal spotting since Saturday; pain worsening since. Pt denies change for pregnancy verbalizing "skip periods with stress."

## 2020-06-08 NOTE — ED Notes (Signed)
Date and time results received: 06/08/20 1423  Test: glucose Critical Value: 545  Name of Provider Notified: Maryan Rued MD  Orders Received? Or Actions Taken?: pt to get next available room

## 2020-06-10 ENCOUNTER — Emergency Department (HOSPITAL_COMMUNITY): Payer: BC Managed Care – PPO

## 2020-06-10 ENCOUNTER — Inpatient Hospital Stay (HOSPITAL_COMMUNITY)
Admission: EM | Admit: 2020-06-10 | Discharge: 2020-06-26 | DRG: 871 | Disposition: A | Discharge status: Home / Self Care | Payer: BC Managed Care – PPO | Attending: Internal Medicine | Admitting: Internal Medicine

## 2020-06-10 ENCOUNTER — Ambulatory Visit (HOSPITAL_COMMUNITY)
Admission: EM | Admit: 2020-06-10 | Discharge: 2020-06-10 | Disposition: A | Discharge status: Home / Self Care | Payer: Medicaid Other

## 2020-06-10 ENCOUNTER — Encounter (HOSPITAL_COMMUNITY): Payer: Self-pay | Admitting: *Deleted

## 2020-06-10 ENCOUNTER — Other Ambulatory Visit: Payer: Self-pay

## 2020-06-10 DIAGNOSIS — Z794 Long term (current) use of insulin: Secondary | ICD-10-CM

## 2020-06-10 DIAGNOSIS — R102 Pelvic and perineal pain unspecified side: Secondary | ICD-10-CM

## 2020-06-10 DIAGNOSIS — F329 Major depressive disorder, single episode, unspecified: Secondary | ICD-10-CM | POA: Diagnosis present

## 2020-06-10 DIAGNOSIS — E871 Hypo-osmolality and hyponatremia: Secondary | ICD-10-CM | POA: Diagnosis present

## 2020-06-10 DIAGNOSIS — E785 Hyperlipidemia, unspecified: Secondary | ICD-10-CM | POA: Diagnosis present

## 2020-06-10 DIAGNOSIS — I5020 Unspecified systolic (congestive) heart failure: Secondary | ICD-10-CM | POA: Diagnosis not present

## 2020-06-10 DIAGNOSIS — Z6839 Body mass index (BMI) 39.0-39.9, adult: Secondary | ICD-10-CM | POA: Diagnosis not present

## 2020-06-10 DIAGNOSIS — Z7982 Long term (current) use of aspirin: Secondary | ICD-10-CM

## 2020-06-10 DIAGNOSIS — R0600 Dyspnea, unspecified: Secondary | ICD-10-CM | POA: Diagnosis not present

## 2020-06-10 DIAGNOSIS — I429 Cardiomyopathy, unspecified: Secondary | ICD-10-CM | POA: Diagnosis not present

## 2020-06-10 DIAGNOSIS — N170 Acute kidney failure with tubular necrosis: Secondary | ICD-10-CM | POA: Diagnosis present

## 2020-06-10 DIAGNOSIS — G43909 Migraine, unspecified, not intractable, without status migrainosus: Secondary | ICD-10-CM | POA: Diagnosis present

## 2020-06-10 DIAGNOSIS — Z87440 Personal history of urinary (tract) infections: Secondary | ICD-10-CM

## 2020-06-10 DIAGNOSIS — I420 Dilated cardiomyopathy: Secondary | ICD-10-CM | POA: Diagnosis not present

## 2020-06-10 DIAGNOSIS — Z79899 Other long term (current) drug therapy: Secondary | ICD-10-CM

## 2020-06-10 DIAGNOSIS — E1122 Type 2 diabetes mellitus with diabetic chronic kidney disease: Secondary | ICD-10-CM | POA: Diagnosis present

## 2020-06-10 DIAGNOSIS — D631 Anemia in chronic kidney disease: Secondary | ICD-10-CM | POA: Diagnosis present

## 2020-06-10 DIAGNOSIS — Z90722 Acquired absence of ovaries, bilateral: Secondary | ICD-10-CM

## 2020-06-10 DIAGNOSIS — N1832 Chronic kidney disease, stage 3b: Secondary | ICD-10-CM | POA: Diagnosis present

## 2020-06-10 DIAGNOSIS — Z20822 Contact with and (suspected) exposure to covid-19: Secondary | ICD-10-CM | POA: Diagnosis present

## 2020-06-10 DIAGNOSIS — R000 Tachycardia, unspecified: Secondary | ICD-10-CM | POA: Diagnosis not present

## 2020-06-10 DIAGNOSIS — R6520 Severe sepsis without septic shock: Secondary | ICD-10-CM | POA: Diagnosis present

## 2020-06-10 DIAGNOSIS — Z842 Family history of other diseases of the genitourinary system: Secondary | ICD-10-CM

## 2020-06-10 DIAGNOSIS — I10 Essential (primary) hypertension: Secondary | ICD-10-CM | POA: Diagnosis not present

## 2020-06-10 DIAGNOSIS — Z86718 Personal history of other venous thrombosis and embolism: Secondary | ICD-10-CM

## 2020-06-10 DIAGNOSIS — I447 Left bundle-branch block, unspecified: Secondary | ICD-10-CM | POA: Diagnosis present

## 2020-06-10 DIAGNOSIS — F419 Anxiety disorder, unspecified: Secondary | ICD-10-CM | POA: Diagnosis present

## 2020-06-10 DIAGNOSIS — N10 Acute tubulo-interstitial nephritis: Secondary | ICD-10-CM | POA: Diagnosis present

## 2020-06-10 DIAGNOSIS — N179 Acute kidney failure, unspecified: Secondary | ICD-10-CM | POA: Diagnosis not present

## 2020-06-10 DIAGNOSIS — Z833 Family history of diabetes mellitus: Secondary | ICD-10-CM

## 2020-06-10 DIAGNOSIS — R34 Anuria and oliguria: Secondary | ICD-10-CM | POA: Diagnosis not present

## 2020-06-10 DIAGNOSIS — A419 Sepsis, unspecified organism: Secondary | ICD-10-CM | POA: Diagnosis present

## 2020-06-10 DIAGNOSIS — N3090 Cystitis, unspecified without hematuria: Secondary | ICD-10-CM | POA: Diagnosis present

## 2020-06-10 DIAGNOSIS — Z9049 Acquired absence of other specified parts of digestive tract: Secondary | ICD-10-CM

## 2020-06-10 DIAGNOSIS — E875 Hyperkalemia: Secondary | ICD-10-CM | POA: Diagnosis not present

## 2020-06-10 DIAGNOSIS — R0902 Hypoxemia: Secondary | ICD-10-CM

## 2020-06-10 DIAGNOSIS — R9439 Abnormal result of other cardiovascular function study: Secondary | ICD-10-CM | POA: Diagnosis not present

## 2020-06-10 DIAGNOSIS — N12 Tubulo-interstitial nephritis, not specified as acute or chronic: Secondary | ICD-10-CM

## 2020-06-10 DIAGNOSIS — B3749 Other urogenital candidiasis: Secondary | ICD-10-CM | POA: Diagnosis not present

## 2020-06-10 DIAGNOSIS — N19 Unspecified kidney failure: Secondary | ICD-10-CM

## 2020-06-10 DIAGNOSIS — I214 Non-ST elevation (NSTEMI) myocardial infarction: Secondary | ICD-10-CM | POA: Diagnosis not present

## 2020-06-10 DIAGNOSIS — E669 Obesity, unspecified: Secondary | ICD-10-CM | POA: Diagnosis present

## 2020-06-10 DIAGNOSIS — G43109 Migraine with aura, not intractable, without status migrainosus: Secondary | ICD-10-CM | POA: Diagnosis not present

## 2020-06-10 DIAGNOSIS — I130 Hypertensive heart and chronic kidney disease with heart failure and stage 1 through stage 4 chronic kidney disease, or unspecified chronic kidney disease: Secondary | ICD-10-CM | POA: Diagnosis present

## 2020-06-10 DIAGNOSIS — F32A Depression, unspecified: Secondary | ICD-10-CM | POA: Diagnosis present

## 2020-06-10 DIAGNOSIS — D509 Iron deficiency anemia, unspecified: Secondary | ICD-10-CM | POA: Diagnosis present

## 2020-06-10 DIAGNOSIS — IMO0002 Reserved for concepts with insufficient information to code with codable children: Secondary | ICD-10-CM | POA: Diagnosis present

## 2020-06-10 DIAGNOSIS — I5021 Acute systolic (congestive) heart failure: Secondary | ICD-10-CM | POA: Diagnosis not present

## 2020-06-10 DIAGNOSIS — Z8249 Family history of ischemic heart disease and other diseases of the circulatory system: Secondary | ICD-10-CM

## 2020-06-10 DIAGNOSIS — E1165 Type 2 diabetes mellitus with hyperglycemia: Secondary | ICD-10-CM | POA: Diagnosis present

## 2020-06-10 DIAGNOSIS — D508 Other iron deficiency anemias: Secondary | ICD-10-CM | POA: Diagnosis not present

## 2020-06-10 DIAGNOSIS — R0602 Shortness of breath: Secondary | ICD-10-CM

## 2020-06-10 DIAGNOSIS — N183 Chronic kidney disease, stage 3 unspecified: Secondary | ICD-10-CM | POA: Diagnosis not present

## 2020-06-10 LAB — I-STAT BETA HCG BLOOD, ED (MC, WL, AP ONLY): I-stat hCG, quantitative: 14.8 m[IU]/mL — ABNORMAL HIGH (ref <5)

## 2020-06-10 LAB — URINALYSIS, ROUTINE W REFLEX MICROSCOPIC
Bilirubin Urine: NEGATIVE
Glucose, UA: >=500 mg/dL — AB
Ketones, ur: NEGATIVE mg/dL
Nitrite: NEGATIVE
Protein, ur: >=300 mg/dL — AB
Specific Gravity, Urine: 1.017 (ref 1.005–1.030)
WBC, UA: >50 WBC/hpf — ABNORMAL HIGH (ref 0–5)
pH: 5 (ref 5.0–8.0)

## 2020-06-10 LAB — CBG MONITORING, ED
Glucose-Capillary: 455 mg/dL — ABNORMAL HIGH (ref 70–99)
Glucose-Capillary: 395 mg/dL — ABNORMAL HIGH (ref 70–99)
Glucose-Capillary: 376 mg/dL — ABNORMAL HIGH (ref 70–99)
Glucose-Capillary: 247 mg/dL — ABNORMAL HIGH (ref 70–99)
Glucose-Capillary: 281 mg/dL — ABNORMAL HIGH (ref 70–99)

## 2020-06-10 LAB — CBC
HCT: 26.2 % — ABNORMAL LOW (ref 36.0–46.0)
Hemoglobin: 8.5 g/dL — ABNORMAL LOW (ref 12.0–15.0)
MCH: 29 pg (ref 26.0–34.0)
MCHC: 32.4 g/dL (ref 30.0–36.0)
MCV: 89.4 fL (ref 80.0–100.0)
Platelets: 302 10*3/uL (ref 150–400)
RBC: 2.93 MIL/uL — ABNORMAL LOW (ref 3.87–5.11)
RDW: 11.9 % (ref 11.5–15.5)
WBC: 16.8 10*3/uL — ABNORMAL HIGH (ref 4.0–10.5)
nRBC: 0 % (ref 0.0–0.2)

## 2020-06-10 LAB — COMPREHENSIVE METABOLIC PANEL
ALT: 16 U/L (ref 0–44)
AST: 14 U/L — ABNORMAL LOW (ref 15–41)
Albumin: 1.6 g/dL — ABNORMAL LOW (ref 3.5–5.0)
Alkaline Phosphatase: 86 U/L (ref 38–126)
Anion gap: 12 (ref 5–15)
BUN: 34 mg/dL — ABNORMAL HIGH (ref 6–20)
CO2: 22 mmol/L (ref 22–32)
Calcium: 8.4 mg/dL — ABNORMAL LOW (ref 8.9–10.3)
Chloride: 99 mmol/L (ref 98–111)
Creatinine, Ser: 4.05 mg/dL — ABNORMAL HIGH (ref 0.44–1.00)
GFR calc Af Amer: 15 mL/min — ABNORMAL LOW (ref >60)
GFR calc non Af Amer: 13 mL/min — ABNORMAL LOW (ref >60)
Glucose, Bld: 508 mg/dL (ref 70–99)
Potassium: 4.3 mmol/L (ref 3.5–5.1)
Sodium: 133 mmol/L — ABNORMAL LOW (ref 135–145)
Total Bilirubin: 0.5 mg/dL (ref 0.3–1.2)
Total Protein: 6.5 g/dL (ref 6.5–8.1)

## 2020-06-10 LAB — LACTIC ACID, PLASMA
Lactic Acid, Venous: 1.1 mmol/L (ref 0.5–1.9)
Lactic Acid, Venous: 0.7 mmol/L (ref 0.5–1.9)

## 2020-06-10 LAB — HCG, SERUM, QUALITATIVE: Preg, Serum: NEGATIVE

## 2020-06-10 LAB — WET PREP, GENITAL
Clue Cells Wet Prep HPF POC: NONE SEEN
Sperm: NONE SEEN
Trich, Wet Prep: NONE SEEN
Yeast Wet Prep HPF POC: NONE SEEN

## 2020-06-10 LAB — PROTIME-INR
INR: 1.3 — ABNORMAL HIGH (ref 0.8–1.2)
Prothrombin Time: 15.8 seconds — ABNORMAL HIGH (ref 11.4–15.2)

## 2020-06-10 LAB — LIPASE, BLOOD: Lipase: 23 U/L (ref 11–51)

## 2020-06-10 LAB — HIV ANTIBODY (ROUTINE TESTING W REFLEX): HIV Screen 4th Generation wRfx: NONREACTIVE

## 2020-06-10 LAB — SARS CORONAVIRUS 2 BY RT PCR (DIASORIN): SARS Coronavirus 2: NEGATIVE

## 2020-06-10 LAB — APTT: aPTT: 43 seconds — ABNORMAL HIGH (ref 24–36)

## 2020-06-10 MED ORDER — ASPIRIN EC 81 MG PO TBEC
81.0000 mg | DELAYED_RELEASE_TABLET | Freq: Every day | ORAL | Status: DC
  Administered 2020-06-11 – 2020-06-15 (×5): 81 mg via ORAL
  Filled 2020-06-10 (×6): qty 1

## 2020-06-10 MED ORDER — INSULIN ASPART 100 UNIT/ML ~~LOC~~ SOLN
8.0000 [IU] | Freq: Once | SUBCUTANEOUS | Status: AC
  Administered 2020-06-10: 8 [IU] via SUBCUTANEOUS

## 2020-06-10 MED ORDER — IBUPROFEN 400 MG PO TABS
600.0000 mg | ORAL_TABLET | Freq: Once | ORAL | Status: AC
  Administered 2020-06-10: 600 mg via ORAL
  Filled 2020-06-10: qty 1

## 2020-06-10 MED ORDER — ONDANSETRON HCL 4 MG PO TABS
4.0000 mg | ORAL_TABLET | Freq: Four times a day (QID) | ORAL | Status: DC | PRN
  Administered 2020-06-17 – 2020-06-20 (×2): 4 mg via ORAL
  Filled 2020-06-10 (×2): qty 1

## 2020-06-10 MED ORDER — FLUTICASONE PROPIONATE 50 MCG/ACT NA SUSP
2.0000 | Freq: Every day | NASAL | Status: DC
  Administered 2020-06-11 – 2020-06-26 (×14): 2 via NASAL
  Filled 2020-06-10: qty 16

## 2020-06-10 MED ORDER — ACETAMINOPHEN 650 MG RE SUPP: 650.0000 mg | Freq: Four times a day (QID) | RECTAL | Status: DC | PRN

## 2020-06-10 MED ORDER — AMITRIPTYLINE HCL 10 MG PO TABS
10.0000 mg | ORAL_TABLET | Freq: Every day | ORAL | Status: DC
  Administered 2020-06-11 – 2020-06-25 (×15): 10 mg via ORAL
  Filled 2020-06-10 (×18): qty 1

## 2020-06-10 MED ORDER — ATORVASTATIN CALCIUM 10 MG PO TABS
20.0000 mg | ORAL_TABLET | Freq: Every day | ORAL | Status: DC
  Administered 2020-06-11 – 2020-06-26 (×16): 20 mg via ORAL
  Filled 2020-06-10 (×16): qty 2

## 2020-06-10 MED ORDER — ACETAMINOPHEN 325 MG PO TABS
650.0000 mg | ORAL_TABLET | Freq: Four times a day (QID) | ORAL | Status: DC | PRN
  Administered 2020-06-11 – 2020-06-25 (×17): 650 mg via ORAL
  Filled 2020-06-10 (×18): qty 2

## 2020-06-10 MED ORDER — HEPARIN SODIUM (PORCINE) 5000 UNIT/ML IJ SOLN
5000.0000 [IU] | Freq: Three times a day (TID) | INTRAMUSCULAR | Status: DC
  Administered 2020-06-11 – 2020-06-16 (×16): 5000 [IU] via SUBCUTANEOUS
  Filled 2020-06-10 (×17): qty 1

## 2020-06-10 MED ORDER — INSULIN ASPART 100 UNIT/ML ~~LOC~~ SOLN
0.0000 [IU] | Freq: Three times a day (TID) | SUBCUTANEOUS | Status: DC
  Administered 2020-06-11: 11 [IU] via SUBCUTANEOUS

## 2020-06-10 MED ORDER — SODIUM CHLORIDE 0.9 % IV BOLUS
1000.0000 mL | Freq: Once | INTRAVENOUS | Status: AC
  Administered 2020-06-10: 1000 mL via INTRAVENOUS

## 2020-06-10 MED ORDER — ACETAMINOPHEN 325 MG PO TABS
650.0000 mg | ORAL_TABLET | Freq: Once | ORAL | Status: AC
  Administered 2020-06-10: 650 mg via ORAL
  Filled 2020-06-10: qty 2

## 2020-06-10 MED ORDER — ONDANSETRON HCL 4 MG/2ML IJ SOLN
4.0000 mg | Freq: Four times a day (QID) | INTRAMUSCULAR | Status: DC | PRN
  Administered 2020-06-11 – 2020-06-19 (×12): 4 mg via INTRAVENOUS
  Filled 2020-06-10 (×12): qty 2

## 2020-06-10 MED ORDER — MORPHINE SULFATE (PF) 4 MG/ML IV SOLN
4.0000 mg | Freq: Once | INTRAVENOUS | Status: AC
  Administered 2020-06-10: 4 mg via INTRAVENOUS
  Filled 2020-06-10: qty 1

## 2020-06-10 MED ORDER — SODIUM CHLORIDE 0.9 % IV SOLN: INTRAVENOUS | Status: DC

## 2020-06-10 MED ORDER — CLONIDINE HCL 0.1 MG PO TABS: 0.1000 mg | ORAL_TABLET | Freq: Every day | ORAL | Status: DC

## 2020-06-10 MED ORDER — SODIUM CHLORIDE 0.9% FLUSH
3.0000 mL | Freq: Once | INTRAVENOUS | Status: AC
  Administered 2020-06-10: 3 mL via INTRAVENOUS

## 2020-06-10 MED ORDER — ARIPIPRAZOLE 2 MG PO TABS
20.0000 mg | ORAL_TABLET | Freq: Every day | ORAL | Status: DC
  Administered 2020-06-11: 20 mg via ORAL
  Filled 2020-06-10 (×2): qty 10

## 2020-06-10 MED ORDER — INSULIN ASPART PROT & ASPART (70-30 MIX) 100 UNIT/ML PEN
35.0000 [IU] | PEN_INJECTOR | Freq: Every day | SUBCUTANEOUS | Status: DC
  Filled 2020-06-10: qty 3

## 2020-06-10 MED ORDER — INSULIN ASPART 100 UNIT/ML ~~LOC~~ SOLN
0.0000 [IU] | Freq: Every day | SUBCUTANEOUS | Status: DC
  Administered 2020-06-11: 4 [IU] via SUBCUTANEOUS

## 2020-06-10 MED ORDER — ALBUTEROL SULFATE (2.5 MG/3ML) 0.083% IN NEBU
2.5000 mg | INHALATION_SOLUTION | RESPIRATORY_TRACT | Status: DC | PRN
  Administered 2020-06-11: 2.5 mg via RESPIRATORY_TRACT
  Filled 2020-06-10: qty 3

## 2020-06-10 MED ORDER — PROPRANOLOL HCL 10 MG PO TABS
10.0000 mg | ORAL_TABLET | Freq: Two times a day (BID) | ORAL | Status: DC
  Administered 2020-06-11: 10 mg via ORAL
  Filled 2020-06-10: qty 1

## 2020-06-10 MED ORDER — SODIUM CHLORIDE 0.9 % IV SOLN
1.0000 g | Freq: Two times a day (BID) | INTRAVENOUS | Status: DC
  Administered 2020-06-11 (×2): 1 g via INTRAVENOUS
  Filled 2020-06-10 (×3): qty 1

## 2020-06-10 MED ORDER — OXYCODONE HCL 5 MG PO TABS
5.0000 mg | ORAL_TABLET | ORAL | Status: DC | PRN
  Administered 2020-06-11 (×3): 5 mg via ORAL
  Filled 2020-06-10 (×3): qty 1

## 2020-06-10 MED ORDER — PIPERACILLIN-TAZOBACTAM 3.375 G IVPB 30 MIN
3.3750 g | Freq: Once | INTRAVENOUS | Status: AC
  Administered 2020-06-10: 3.375 g via INTRAVENOUS
  Filled 2020-06-10: qty 50

## 2020-06-10 NOTE — ED Notes (Signed)
Spoke to [employer] , np about patient.  Patient is having abdominal pain, pelvic pain and reports cbg equipment read "high" this morning.  Instructed patient to go to ED

## 2020-06-10 NOTE — Progress Notes (Signed)
Pharmacy Antibiotic Note  [name] is a 39 y.o. female admitted on 06/10/2020 with UTI.  Pharmacy has been consulted for Meropenem dosing. Patient has a Hx of ESBL UTI.   Plan: - Meropenem 1g IV q12h  - Monitor patient's renal function and urine output  - De-escalate abx when able.   Height: 5\' 10"  (177.8 cm) Weight: (!) 117.9 kg (260 lb) IBW/kg (Calculated) : 68.5  Temp (24hrs), Avg:100.5 F (38.1 C), Min:98.3 F (36.8 C), Max:102.3 F (39.1 C)  Recent Labs  Lab 06/08/20 1331 06/10/20 1009 06/10/20 1627  WBC 11.3* 16.8*  --   CREATININE 3.63* 4.05*  --   LATICACIDVEN  --   --  1.1    Estimated Creatinine Clearance: 26 mL/min (A) (by C-G formula based on SCr of 4.05 mg/dL (H)).    No Known Allergies  Antimicrobials this admission: 7/28 Zosyn x 1  7/28 Meropenem >>  Dose adjustments this admission:   Microbiology results: Pending   Thank you for allowing pharmacy to be a part of this patients care.  Duanne Limerick 06/10/2020 10:54 PM

## 2020-06-10 NOTE — ED Notes (Signed)
Patient is being discharged from the Urgent Care and sent to the Emergency Department via wheelchair . Per Loura Halt, NP, patient is in need of higher level of care due to recent labs/complaint today/cbg reading. Patient is aware and verbalizes understanding of plan of care.  Vitals:   06/10/20 0854  BP: (!) 137/72  Pulse: (!) 118  Resp: 22  Temp: (!) 100.9 F (38.3 C)  SpO2: 100%

## 2020-06-10 NOTE — ED Notes (Signed)
Patient transported to CT 
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
## 2020-06-10 NOTE — ED Notes (Signed)
Pelvic exam done by Margaux - PA and Lavella Lemons - EMT assisted.

## 2020-06-10 NOTE — ED Notes (Signed)
Patient transported to [facility] 
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
## 2020-06-10 NOTE — ED Triage Notes (Signed)
Pt is here with abdominal and pelvic pain for 3 days and reports n/V.  Pt states she is diabetic and blood sugar was high at 0130 and she took 35 units of 70/30.  Blood sugar still reads high today and she went to urgent care and then UC brought her here.  Pt is alert and oriented

## 2020-06-10 NOTE — ED Notes (Signed)
CBG 508

## 2020-06-11 ENCOUNTER — Inpatient Hospital Stay (HOSPITAL_COMMUNITY): Payer: BC Managed Care – PPO

## 2020-06-11 DIAGNOSIS — A419 Sepsis, unspecified organism: Principal | ICD-10-CM

## 2020-06-11 DIAGNOSIS — N179 Acute kidney failure, unspecified: Secondary | ICD-10-CM

## 2020-06-11 DIAGNOSIS — R0600 Dyspnea, unspecified: Secondary | ICD-10-CM

## 2020-06-11 DIAGNOSIS — N183 Chronic kidney disease, stage 3 unspecified: Secondary | ICD-10-CM

## 2020-06-11 DIAGNOSIS — R6520 Severe sepsis without septic shock: Secondary | ICD-10-CM

## 2020-06-11 LAB — RPR: RPR Ser Ql: NONREACTIVE

## 2020-06-11 LAB — GLUCOSE, CAPILLARY
Glucose-Capillary: 324 mg/dL — ABNORMAL HIGH (ref 70–99)
Glucose-Capillary: 260 mg/dL — ABNORMAL HIGH (ref 70–99)
Glucose-Capillary: 141 mg/dL — ABNORMAL HIGH (ref 70–99)
Glucose-Capillary: 74 mg/dL (ref 70–99)
Glucose-Capillary: 113 mg/dL — ABNORMAL HIGH (ref 70–99)

## 2020-06-11 LAB — ECHOCARDIOGRAM COMPLETE
Area-P 1/2: 5.13 cm2
Calc EF: 43.4 %
Height: 70 in
MV M vel: 4.09 m/s
MV Peak grad: 66.9 mmHg
S' Lateral: 3.4 cm
Single Plane A2C EF: 45.1 %
Single Plane A4C EF: 47.5 %
Weight: 4405.67 oz

## 2020-06-11 LAB — HEMOGLOBIN A1C
Hgb A1c MFr Bld: 12.9 % — ABNORMAL HIGH (ref 4.8–5.6)
Mean Plasma Glucose: 323.53 mg/dL

## 2020-06-11 MED ORDER — MORPHINE SULFATE (PF) 2 MG/ML IV SOLN: 2.0000 mg | INTRAVENOUS | Status: DC | PRN

## 2020-06-11 MED ORDER — FENTANYL CITRATE (PF) 100 MCG/2ML IJ SOLN
25.0000 ug | Freq: Once | INTRAMUSCULAR | Status: AC
  Administered 2020-06-11: 25 ug via INTRAVENOUS
  Filled 2020-06-11: qty 2

## 2020-06-11 MED ORDER — ACETAMINOPHEN 325 MG PO TABS
650.0000 mg | ORAL_TABLET | Freq: Once | ORAL | Status: AC
  Administered 2020-06-11: 650 mg via ORAL
  Filled 2020-06-11: qty 2

## 2020-06-11 MED ORDER — INSULIN ASPART 100 UNIT/ML ~~LOC~~ SOLN
0.0000 [IU] | Freq: Three times a day (TID) | SUBCUTANEOUS | Status: DC
  Administered 2020-06-12: 1 [IU] via SUBCUTANEOUS
  Administered 2020-06-13 (×2): 2 [IU] via SUBCUTANEOUS
  Administered 2020-06-14: 1 [IU] via SUBCUTANEOUS
  Administered 2020-06-14: 2 [IU] via SUBCUTANEOUS
  Administered 2020-06-14: 3 [IU] via SUBCUTANEOUS
  Administered 2020-06-15 (×3): 2 [IU] via SUBCUTANEOUS
  Administered 2020-06-16: 1 [IU] via SUBCUTANEOUS
  Administered 2020-06-16: 3 [IU] via SUBCUTANEOUS
  Administered 2020-06-16 – 2020-06-17 (×2): 1 [IU] via SUBCUTANEOUS
  Administered 2020-06-17 – 2020-06-18 (×3): 2 [IU] via SUBCUTANEOUS
  Administered 2020-06-19: 3 [IU] via SUBCUTANEOUS
  Administered 2020-06-19 (×2): 2 [IU] via SUBCUTANEOUS
  Administered 2020-06-20 (×2): 3 [IU] via SUBCUTANEOUS
  Administered 2020-06-20 – 2020-06-21 (×2): 1 [IU] via SUBCUTANEOUS
  Administered 2020-06-21 – 2020-06-22 (×4): 2 [IU] via SUBCUTANEOUS
  Administered 2020-06-23: 3 [IU] via SUBCUTANEOUS
  Administered 2020-06-23: 2 [IU] via SUBCUTANEOUS
  Administered 2020-06-24: 1 [IU] via SUBCUTANEOUS
  Administered 2020-06-24 (×2): 2 [IU] via SUBCUTANEOUS
  Administered 2020-06-25: 1 [IU] via SUBCUTANEOUS
  Administered 2020-06-25: 2 [IU] via SUBCUTANEOUS
  Administered 2020-06-25 – 2020-06-26 (×3): 1 [IU] via SUBCUTANEOUS

## 2020-06-11 MED ORDER — SODIUM CHLORIDE 0.9 % IV SOLN
1.0000 g | Freq: Once | INTRAVENOUS | Status: AC
  Administered 2020-06-11: 1 g via INTRAVENOUS
  Filled 2020-06-11: qty 1

## 2020-06-11 MED ORDER — PERFLUTREN LIPID MICROSPHERE
1.0000 mL | INTRAVENOUS | Status: AC | PRN
  Administered 2020-06-11: 2 mL via INTRAVENOUS
  Filled 2020-06-11: qty 10

## 2020-06-11 MED ORDER — LACTATED RINGERS IV SOLN: INTRAVENOUS | Status: DC

## 2020-06-11 MED ORDER — SODIUM CHLORIDE 0.9 % IV SOLN
1.0000 g | Freq: Once | INTRAVENOUS | Status: DC
  Filled 2020-06-11: qty 1

## 2020-06-11 MED ORDER — SODIUM CHLORIDE 0.9 % IV SOLN
2.0000 g | Freq: Two times a day (BID) | INTRAVENOUS | Status: DC
  Administered 2020-06-11 – 2020-06-12 (×2): 2 g via INTRAVENOUS
  Filled 2020-06-11 (×3): qty 2

## 2020-06-11 MED ORDER — SODIUM CHLORIDE 0.9 % IV BOLUS
500.0000 mL | Freq: Once | INTRAVENOUS | Status: AC
  Administered 2020-06-11: 500 mL via INTRAVENOUS

## 2020-06-11 MED ORDER — INSULIN ASPART PROT & ASPART (70-30 MIX) 100 UNIT/ML ~~LOC~~ SUSP
35.0000 [IU] | Freq: Every day | SUBCUTANEOUS | Status: DC
  Administered 2020-06-11: 35 [IU] via SUBCUTANEOUS
  Filled 2020-06-11: qty 10

## 2020-06-11 MED ORDER — INSULIN DETEMIR 100 UNIT/ML ~~LOC~~ SOLN
12.0000 [IU] | Freq: Two times a day (BID) | SUBCUTANEOUS | Status: DC
  Administered 2020-06-11 (×2): 12 [IU] via SUBCUTANEOUS
  Filled 2020-06-11 (×4): qty 0.12

## 2020-06-11 NOTE — Progress Notes (Signed)
°  Echocardiogram 2D Echocardiogram has been performed.  [name] 06/11/2020, 2:46 PM

## 2020-06-11 NOTE — Progress Notes (Signed)
Pt BP decreased to 82/58. New verbal order to 535ml bolus. BP. Now 96/51 (64) HR 105 SPo2 99 [place] 1L  RR 22. Pt comfortable and temp has decreased to 100.4 oral New orders given Will continue to monitor Q1hourx4  Phoebe Sharps, RN

## 2020-06-11 NOTE — Progress Notes (Addendum)
Rapid Response Progress Note  Called at [phone] for MEWS regarding fever and tachycardia. RN stated that rapid response was not needed at this time. RN confirmed that she had PRN medications available to treat fever. RN to call rapid response for when assistance is needed.   [place] Shron Ozer Rapid Response RN

## 2020-06-11 NOTE — Progress Notes (Signed)
Inpatient Diabetes Program Recommendations  AACE/ADA: New Consensus Statement on Inpatient Glycemic Control (2015)  Target Ranges:  Prepandial:   less than 140 mg/dL      Peak postprandial:   less than 180 mg/dL (1-2 hours)      Critically ill patients:  140 - 180 mg/dL   Lab Results  Component Value Date   GLUCAP 260 (H) 06/11/2020   HGBA1C 7.1 (H) 10/29/2019    Review of Glycemic Control Results for [name], [name] (MRN [id number]) as of 06/11/2020 09:26  Ref. Range 06/10/2020 19:55 06/10/2020 22:22 06/11/2020 01:23 06/11/2020 06:29  Glucose-Capillary Latest Ref Range: 70 - 99 mg/dL 247 (H) 281 (H) 324 (H) 260 (H)   Diabetes history: Type 2 DM Outpatient Diabetes medications: Novolog 70/30 35 units QPM Current orders for Inpatient glycemic control: Novolog 70/30 35 units QPM, Novolog 0-20 units TID, Novolog 0-5 units QHS  Inpatient Diabetes Program Recommendations:    Consider the following: -Add A1C as last result was from 2020. -Change regimen to Levemir 12 units BID (to start this AM) -Change correction to Novolog 0-9 units TID & HS given renal status with sepsis.   Thanks, Bronson Curb, MSN, RNC-OB Diabetes Coordinator [phone] (8a-5p)

## 2020-06-11 NOTE — Progress Notes (Signed)
Nutrition Brief Note  Patient identified on the Malnutrition Screening Tool (MST) Report  Wt Readings from Last 15 Encounters:  06/11/20 (!) 124.9 kg  07/29/19 53.5 kg  06/13/19 108.9 kg  10/04/12 116.6 kg  10/02/12 111.1 kg  09/18/12 112 kg  08/27/12 117.7 kg  12/03/10 (!) 128.4 kg   Patient with PMH significant for DM, recurrent UTIs, essential HTN, CKD III, and previous DVT. Presents this admission with UTI. Pt denies loss in appetite (consumes three meals a day) or unintentional wt loss PTA. Skin intact.   Body mass index is 39.51 kg/m.   Current diet order is carb modified , patient is consuming approximately 75-100% of meals at this time. Labs and medications reviewed.   No nutrition interventions warranted at this time. If nutrition issues arise, please consult RD.   Mariana Single RD, LDN Clinical Nutrition Pager listed in [place]

## 2020-06-11 NOTE — Progress Notes (Signed)
  Pharmacy Antibiotic Note  EVVA DIN is a 39 y.o. female admitted on 06/10/2020 with UTI.  Pharmacy has been consulted for Meropenem dosing. Patient has a Hx of ESBL UTI.   Patient clinically worsening with hypotension, tachycardia, increased fever curve and leukocytosis. Urine culture was not ordered on admission - discussed with MD who will get one to check resistance pattern. For now with evolving sepsis picture, will increase to high dose Merrem for sepsis treatment with borderline SCr and worsening clinical picture.   Plan: - Increase Meropenem to 2g IV q12h for now -- if urine output drops off and SCr worsens further, will reduce back to 1g dose.  - Monitor patient's renal function and urine output  - Follow-up urine culture -- may need to broaden further if no response to Merrem.   Height: 5\' 10"  (177.8 cm) Weight: (!) 124.9 kg (275 lb 5.7 oz) IBW/kg (Calculated) : 68.5  Temp (24hrs), Avg:100.9 F (38.3 C), Min:98.3 F (36.8 C), Max:102.5 F (39.2 C)  Recent Labs  Lab 06/08/20 1331 06/10/20 1009 06/10/20 1627 06/10/20 2220  WBC 11.3* 16.8*  --   --   CREATININE 3.63* 4.05*  --   --   LATICACIDVEN  --   --  1.1 0.7    Estimated Creatinine Clearance: 26.8 mL/min (A) (by C-G formula based on SCr of 4.05 mg/dL (H)).    No Known Allergies  Antimicrobials this admission: 7/28 Zosyn x 1  7/28 Meropenem >>  Dose adjustments this admission:   Microbiology results: 7/28 Blood cultures >> 7/28 Wet Prep - negative 7/29 Urine culture >>  Thank you for allowing pharmacy to be a part of this patient's care.  Sloan Leiter, PharmD, BCPS, BCCCP Clinical Pharmacist Please refer to [facility] for [place] numbers 06/11/2020 11:33 AM

## 2020-06-11 NOTE — Progress Notes (Signed)
Pt  BP 107/61 . Pt given 5 mg of Oxycodone and BP now 90/43 (56). Dr. Cathlean Sauer notified  500 ml bolus new orders. Will continue to monitor.     Phoebe Sharps, RN

## 2020-06-11 NOTE — Progress Notes (Addendum)
PROGRESS NOTE    LAYKIN RAINONE  MHD:[id number] DOB: 09-Feb-1981 DOA: 06/10/2020 PCP: Charlott Rakes, MD    Brief Narrative:  Patient admitted to the hospital with a working diagnosis of sepsis due to acute pyelonephritis, end-organ failure hypotension, present on admission.  39 year old female with past medical history for type 2 diabetes mellitus, recurrent urinary tract infections (ESBL Klebsiella), hypertension, CKD stage 3b, history of deep vein thrombosis and obesity class 2.  Patient reported 3 days of lower abdominal pain, associated with nausea and vomiting, no frank dysuria.  On July 26 she came to the emergency department but she left without being seen.  On her initial physical examination she was febrile 39.1 C, blood pressure 135/69, heart rate 111, respiratory rate 13, oxygen saturation 100%.  Her lungs are clear to auscultation bilaterally, heart S1-S2, present rhythmic, her abdomen was soft, tender at the suprapubic region, no guarding or rebound, no lower extremity edema. Sodium 133, potassium 4.3, chloride 99, bicarb 22, glucose 508, BUN 34, creatinine 4.0, lipase 23, AST 14, ALT 16, white count 16.8, hemoglobin 8.5, hematocrit 26.2, platelets 302.  SARS COVID-19 negative.  Urinalysis > 500 glucose, > 300 protein, specific gravity 1.017, 21-50 red cells, > 50 white cells. CT of the abdomen and pelvis showed right-sided perinephric stranding and mild inflammatory changes around the bladder.  Chest radiograph with hilar vascular congestion, right base atelectasis, chronic left base scarring.  EKG, 104 bpm, left axis deviation, left bundle branch block, sinus rhythm, Q wave V1-V2, poor R wave progression, no significant ST segment changes, lead I/aVL T wave versions.   Patient has been placed on meropenem for antibiotic therapy and aggressive fluid resuscitation for hypotension.    Assessment & Plan:   Principal Problem:   Severe sepsis ([place]) Active Problems:   DM  (diabetes mellitus), type 2, uncontrolled (HCC)   Obesity (BMI 30-39.9)   Anxiety   Depression   HTN (hypertension)   Migraines   Acute pyelonephritis   AKI (acute kidney injury) ([place])   CKD (chronic kidney disease) stage 3, GFR 30-59 ml/min   1.  Sepsis due to acute right pyelonephritis, endorgan failure hypotension (present on admission). Suspected recurrent multidrug resistant Klebsiella ESBL infection. This am patient continue to be febrile and now hypotensive 79/39 mmHg. Worsening leukocytosis at 16.8.   Continue IV fluid resuscitation with 500 cc bolus isotonic saline, and will continue maintenance fluids with balanced electrolyte solutions at 100 ml per H. Target a MAP of 65 mmHg and monitor urine output. Continue antibiotic therapy with meropenem to cover possible Klebsiella ESBL, follow on cell count, temperature curve and cultures.  Check echocardiogram, hold on propranolol and clonidine.   Continue fever control with acetaminophen, avoid non steroidal antiinflammatory agents due to low GFR.   Pain control with oxycodone and morphine.   2. AKI on CKD stage 3b. Worsening renal function with serum cr at 4,0 with K at 4,3 and serum bicarbonate at 22.   Will continue volume resuscitation with balanced electrolyte solutions, monitor urine output and follow up renal panel in am. Avoid hypotension and nephrotoxic medications.  3. Uncontrolled T2DM Hgb A1c 7,1. dyslipidemia Will continue basal insulin with galrgine and insulin sliding scale for glucose cover and monitoring.   Continue with atorvastatin.   4. Obesity class 2. BMI is 39. Patient with high risk for inpatient complications.   5. Depression. Continue with ariprazole and amitriptyline.     Patient continue critically ill with imminent risk for worsening hypotension and  sepsis. Critical care time 60 minutes.    Status is: Inpatient  Remains inpatient appropriate because:IV treatments appropriate due to intensity of  illness or inability to take PO   Dispo: The patient is from: Home              Anticipated d/c is to: Home              Anticipated d/c date is: 3 days              Patient currently is not medically stable to d/c.   DVT prophylaxis: Enoxaparin   Code Status:   full  Family Communication:  No family at the bedside       Antimicrobials:   Meropenem.     Subjective: Patient continue to have abdominal pain, no chest pain or dyspnea, no nausea or vomiting. Mild somnolence this am,   Objective: Vitals:   06/11/20 0442 06/11/20 0859 06/11/20 1000 06/11/20 1100  BP:  (!) 153/99 119/81 (!) 99/49  Pulse: 102 (!) 126 (!) 116 103  Resp: 18 20 19  (!) 10  Temp:  (!) 102.5 F (39.2 C) (!) 102.5 F (39.2 C) (!) 102.4 F (39.1 C)  TempSrc:  Oral Oral Oral  SpO2: 99% 100% 100% 92%  Weight:      Height:        Intake/Output Summary (Last 24 hours) at 06/11/2020 1109 Last data filed at 06/11/2020 1696 Gross per 24 hour  Intake 875 ml  Output --  Net 875 ml   Filed Weights   06/10/20 0922 06/11/20 0007  Weight: (!) 117.9 kg (!) 124.9 kg    Examination:   General: deconditioned and ill looking appearing  Neurology: Awake and alert, non focal  E ENT: mild pallor, no icterus, oral mucosa moist Cardiovascular: No JVD. S1-S2 present, rhythmic, no gallops, rubs, or murmurs. Trace lower extremity edema. Pulmonary: positive breath sounds bilaterally, adequate air movement, no wheezing, rhonchi or rales. Gastrointestinal. Abdomen protuberant, soft and non tender Skin. No rashes Musculoskeletal: no joint deformities     Data Reviewed: I have personally reviewed following labs and imaging studies  CBC: Recent Labs  Lab 06/08/20 1331 06/10/20 1009  WBC 11.3* 16.8*  HGB 9.0* 8.5*  HCT 27.2* 26.2*  MCV 88.9 89.4  PLT 281 789   Basic Metabolic Panel: Recent Labs  Lab 06/08/20 1331 06/10/20 1009  NA 132* 133*  K 4.6 4.3  CL 97* 99  CO2 22 22  GLUCOSE 545* 508*  BUN  27* 34*  CREATININE 3.63* 4.05*  CALCIUM 8.4* 8.4*   GFR: Estimated Creatinine Clearance: 26.8 mL/min (A) (by C-G formula based on SCr of 4.05 mg/dL (H)). Liver Function Tests: Recent Labs  Lab 06/08/20 1331 06/10/20 1009  AST 11* 14*  ALT 15 16  ALKPHOS 79 86  BILITOT 0.3 0.5  PROT 6.9 6.5  ALBUMIN 2.2* 1.6*   Recent Labs  Lab 06/08/20 1331 06/10/20 1009  LIPASE 27 23   No results for input(s): AMMONIA in the last 168 hours. Coagulation Profile: Recent Labs  Lab 06/10/20 2220  INR 1.3*   Cardiac Enzymes: No results for input(s): CKTOTAL, CKMB, CKMBINDEX, TROPONINI in the last 168 hours. BNP (last 3 results) No results for input(s): PROBNP in the last 8760 hours. HbA1C: No results for input(s): HGBA1C in the last 72 hours. CBG: Recent Labs  Lab 06/10/20 1555 06/10/20 1955 06/10/20 2222 06/11/20 0123 06/11/20 0629  GLUCAP 376* 247* 281* 324* 260*   Lipid  Profile: No results for input(s): CHOL, HDL, LDLCALC, TRIG, CHOLHDL, LDLDIRECT in the last 72 hours. Thyroid Function Tests: No results for input(s): TSH, T4TOTAL, FREET4, T3FREE, THYROIDAB in the last 72 hours. Anemia Panel: No results for input(s): VITAMINB12, FOLATE, FERRITIN, TIBC, IRON, RETICCTPCT in the last 72 hours.    Radiology Studies: I have reviewed all of the imaging during this hospital visit personally     Scheduled Meds: . amitriptyline  10 mg Oral QHS  . ARIPiprazole  20 mg Oral Daily  . aspirin EC  81 mg Oral Daily  . atorvastatin  20 mg Oral Daily  . cloNIDine  0.1 mg Oral QHS  . fluticasone  2 spray Each Nare Daily  . heparin  5,000 Units Subcutaneous Q8H  . insulin aspart  0-9 Units Subcutaneous TID WC  . insulin detemir  12 Units Subcutaneous BID  . propranolol  10 mg Oral BID   Continuous Infusions: . sodium chloride 100 mL/hr at 06/11/20 1103  . meropenem (MERREM) IV 1 g (06/11/20 1012)     LOS: 1 day        Aelyn Stanaland Gerome Apley, MD

## 2020-06-11 NOTE — Progress Notes (Signed)
Pt Mews score Red due to increase temp of 102.5 and HR in 100's. Rapid notified and Dr.Arrien paged and updated. PRN meds given will continue to monitor .   Phoebe Sharps, RN

## 2020-06-12 ENCOUNTER — Inpatient Hospital Stay (HOSPITAL_COMMUNITY): Payer: BC Managed Care – PPO

## 2020-06-12 DIAGNOSIS — N1832 Chronic kidney disease, stage 3b: Secondary | ICD-10-CM

## 2020-06-12 DIAGNOSIS — F419 Anxiety disorder, unspecified: Secondary | ICD-10-CM

## 2020-06-12 DIAGNOSIS — R6520 Severe sepsis without septic shock: Secondary | ICD-10-CM

## 2020-06-12 LAB — RENAL FUNCTION PANEL
Albumin: 1.3 g/dL — ABNORMAL LOW (ref 3.5–5.0)
Anion gap: 11 (ref 5–15)
BUN: 42 mg/dL — ABNORMAL HIGH (ref 6–20)
CO2: 19 mmol/L — ABNORMAL LOW (ref 22–32)
Calcium: 7.8 mg/dL — ABNORMAL LOW (ref 8.9–10.3)
Chloride: 108 mmol/L (ref 98–111)
Creatinine, Ser: 5.91 mg/dL — ABNORMAL HIGH (ref 0.44–1.00)
GFR calc Af Amer: 10 mL/min — ABNORMAL LOW (ref >60)
GFR calc non Af Amer: 8 mL/min — ABNORMAL LOW (ref >60)
Glucose, Bld: 142 mg/dL — ABNORMAL HIGH (ref 70–99)
Phosphorus: 5.5 mg/dL — ABNORMAL HIGH (ref 2.5–4.6)
Potassium: 4.3 mmol/L (ref 3.5–5.1)
Sodium: 138 mmol/L (ref 135–145)

## 2020-06-12 LAB — CBC WITH DIFFERENTIAL/PLATELET
Abs Immature Granulocytes: 0.21 10*3/uL — ABNORMAL HIGH (ref 0.00–0.07)
Basophils Absolute: 0 10*3/uL (ref 0.0–0.1)
Basophils Relative: 0 %
Eosinophils Absolute: 0.2 10*3/uL (ref 0.0–0.5)
Eosinophils Relative: 1 %
HCT: 22.1 % — ABNORMAL LOW (ref 36.0–46.0)
Hemoglobin: 7.2 g/dL — ABNORMAL LOW (ref 12.0–15.0)
Immature Granulocytes: 1 %
Lymphocytes Relative: 8 %
Lymphs Abs: 1.3 10*3/uL (ref 0.7–4.0)
MCH: 29.1 pg (ref 26.0–34.0)
MCHC: 32.6 g/dL (ref 30.0–36.0)
MCV: 89.5 fL (ref 80.0–100.0)
Monocytes Absolute: 1.5 10*3/uL — ABNORMAL HIGH (ref 0.1–1.0)
Monocytes Relative: 9 %
Neutro Abs: 13.6 10*3/uL — ABNORMAL HIGH (ref 1.7–7.7)
Neutrophils Relative %: 81 %
Platelets: 272 10*3/uL (ref 150–400)
RBC: 2.47 MIL/uL — ABNORMAL LOW (ref 3.87–5.11)
RDW: 12.4 % (ref 11.5–15.5)
WBC: 16.9 10*3/uL — ABNORMAL HIGH (ref 4.0–10.5)
nRBC: 0 % (ref 0.0–0.2)

## 2020-06-12 LAB — GLUCOSE, CAPILLARY
Glucose-Capillary: 96 mg/dL (ref 70–99)
Glucose-Capillary: 70 mg/dL (ref 70–99)
Glucose-Capillary: 131 mg/dL — ABNORMAL HIGH (ref 70–99)
Glucose-Capillary: 150 mg/dL — ABNORMAL HIGH (ref 70–99)

## 2020-06-12 LAB — SODIUM, URINE, RANDOM: Sodium, Ur: 26 mmol/L

## 2020-06-12 LAB — CREATININE, URINE, RANDOM: Creatinine, Urine: 262.27 mg/dL

## 2020-06-12 MED ORDER — FENTANYL CITRATE (PF) 100 MCG/2ML IJ SOLN
12.5000 ug | Freq: Four times a day (QID) | INTRAMUSCULAR | Status: DC | PRN
  Administered 2020-06-12 – 2020-06-16 (×12): 12.5 ug via INTRAVENOUS
  Filled 2020-06-12 (×13): qty 2

## 2020-06-12 MED ORDER — SODIUM CHLORIDE 0.9 % IV SOLN
1.0000 g | Freq: Two times a day (BID) | INTRAVENOUS | Status: DC
  Administered 2020-06-12 – 2020-06-18 (×12): 1 g via INTRAVENOUS
  Filled 2020-06-12 (×13): qty 1

## 2020-06-12 MED ORDER — CALCIUM ACETATE (PHOS BINDER) 667 MG PO CAPS
667.0000 mg | ORAL_CAPSULE | Freq: Three times a day (TID) | ORAL | Status: DC
  Administered 2020-06-12 – 2020-06-26 (×36): 667 mg via ORAL
  Filled 2020-06-12 (×34): qty 1

## 2020-06-12 MED ORDER — INSULIN DETEMIR 100 UNIT/ML ~~LOC~~ SOLN
12.0000 [IU] | Freq: Every day | SUBCUTANEOUS | Status: DC
  Filled 2020-06-12: qty 0.12

## 2020-06-12 MED ORDER — STERILE WATER FOR INJECTION IV SOLN
INTRAVENOUS | Status: DC
  Filled 2020-06-12 (×3): qty 850

## 2020-06-12 MED ORDER — CHLORHEXIDINE GLUCONATE CLOTH 2 % EX PADS
6.0000 | MEDICATED_PAD | Freq: Every day | CUTANEOUS | Status: DC
  Administered 2020-06-12 – 2020-06-26 (×14): 6 via TOPICAL

## 2020-06-12 NOTE — Progress Notes (Signed)
  Pharmacy Antibiotic Note  [name] is a 39 y.o. female admitted on 06/10/2020 with UTI.  Pharmacy has been consulted for Meropenem dosing. Patient has a Hx of ESBL UTI.   Patient clinically stable soft blood pressure, tachycardia, afebrile, and stable leukocytosis. Urine culture was not ordered on admission - discussed with MD who will get one to check resistance pattern. Urine culture is currently pending to ensure appropriate therapy. Patients renal function has worsened with a current Scr of 5.91 and urine output of 174ml. Current dose needs to be reduced to account for worsening renal function.   Plan: - Decrease Meropenem to 1g IV q12h for now -- consider adjusting should renal function improve - Monitor patient's renal function and urine output  - Follow-up urine culture -- may need to broaden further if no response to Merrem.   Height: 5\' 10"  (177.8 cm) Weight: (!) 124.9 kg (275 lb 5.7 oz) IBW/kg (Calculated) : 68.5  Temp (24hrs), Avg:100.5 F (38.1 C), Min:98.9 F (37.2 C), Max:102.4 F (39.1 C)  Recent Labs  Lab 06/08/20 1331 06/10/20 1009 06/10/20 1627 06/10/20 2220 06/12/20 0153  WBC 11.3* 16.8*  --   --  16.9*  CREATININE 3.63* 4.05*  --   --  5.91*  LATICACIDVEN  --   --  1.1 0.7  --     Estimated Creatinine Clearance: 18.4 mL/min (A) (by C-G formula based on SCr of 5.91 mg/dL (H)).    No Known Allergies  Antimicrobials this admission: 7/28 Zosyn x 1  7/28 Meropenem >>  Dose adjustments this admission: Meropenem 1g>>2g>>1g  Microbiology results: 7/28 Blood cultures >> 7/28 Wet Prep - negative 7/29 Urine culture >>  Thank you for allowing pharmacy to be a part of this patient's care.  Cephus Slater, PharmD, [place] [facility] Resident [phone] 06/12/2020 10:44 AM

## 2020-06-12 NOTE — Significant Event (Signed)
HOSPITAL MEDICINE OVERNIGHT EVENT NOTE 7/29 10pm  Notified by nursing patient is complaining of abdominal pain, not improving with tylenol.  Cant give NSAID due to renal function.  SBP currently 108 but persisting hypotension throughout the hospital stay noted.  Will give one time dose of 77mcg of Fentanyl, monitor BP closely.   ADDENDUM 7/30 12:40AM  Nursing states patient has had no urine output in 24 hrs.  I have requested a bladder scan and ordered a stat renal function panel.  ADDENDUM 7/30 4:20AM  Renal function panel reveals continued rapid progression of renal injury with BUN/Cr now 42/5.91.  Bladder scan ordered revealing minimal urine in the bladder.  Ordering renal ultrasound, urine sodium, urine urea, urine creatinine.  Continuing LR infusion.    Nephrology will need to be consulted in the morning for consideration of dialysis.

## 2020-06-12 NOTE — Progress Notes (Signed)
Inpatient Diabetes Program Recommendations  AACE/ADA: New Consensus Statement on Inpatient Glycemic Control (2015)  Target Ranges:  Prepandial:   less than 140 mg/dL      Peak postprandial:   less than 180 mg/dL (1-2 hours)      Critically ill patients:  140 - 180 mg/dL   Lab Results  Component Value Date   GLUCAP 96 06/12/2020   HGBA1C 12.9 (H) 06/11/2020    Review of Glycemic Control Results for Ann, Cabrera (MRN [id number]) as of 06/12/2020 09:17  Ref. Range 06/11/2020 10:59 06/11/2020 15:21 06/11/2020 21:08 06/12/2020 06:19  Glucose-Capillary Latest Ref Range: 70 - 99 mg/dL 141 (H) 74 113 (H) 96   Diabetes history: Type 2 DM Outpatient Diabetes medications: Novolog 70/30 35 units QPM Current orders for Inpatient glycemic control: Levemir 12 units BID, Novolog 0-9 units TID, Novolog 0-5 units QHS  Inpatient Diabetes Program Recommendations:   Consider slight decrease to basal: Levemir 10 units BID.  Secure chat sent to MD.  Will plan to see patient today.   Thanks, Bronson Curb, MSN, RNC-OB Diabetes Coordinator [phone] (8a-5p)

## 2020-06-12 NOTE — Progress Notes (Signed)
   06/12/20 1800  Assess: MEWS Score  Temp 100.3 F (37.9 C)  BP (!) 119/94  Pulse Rate (!) 114  ECG Heart Rate (!) 114  Resp 20  SpO2 100 %  O2 Device Room Air  Assess: MEWS Score  MEWS Temp 0  MEWS Systolic 0  MEWS Pulse 2  MEWS RR 0  MEWS LOC 0  MEWS Score 2  MEWS Score Color Yellow  Assess: if the MEWS score is Yellow or Red  Were vital signs taken at a resting state? Yes  Focused Assessment No change from prior assessment  Early Detection of Sepsis Score *See Row Information* High  MEWS guidelines implemented *See Row Information* Yes  Treat  Pain Location Abdomen  Escalate  MEWS: Escalate Yellow: discuss with charge nurse/RN and consider discussing with provider and RRT  Notify: Provider  Provider Name/Title Arrien, MD  Date Provider Notified 06/12/20  Time Provider Notified [phone]  Notification Type Page  Notification Reason Other (Comment) (change in vitals, c/o pain)  Response Other (Comment) (dr. Cathlean Sauer advises to give fentanyl and tylenol, recheck vs)  Date of Provider Response 06/12/20  Time of Provider Response [phone]

## 2020-06-12 NOTE — Progress Notes (Signed)
Inpatient Diabetes Program   Spoke with patient about diabetes and home regimen for diabetes control.  Patient reports that she is splitting the dose of 70/30 20 units qam and 15 units qpm. Discussed current A1c of 12.9% and importance of glucose control on renal function.  Discussed checking glucose consistently. Explained how hyperglycemia leads to damage within blood vessels which lead to the common complications seen with uncontrolled diabetes. Stressed to the patient the importance of improving glycemic control to prevent further complications from uncontrolled diabetes. Discussed impact of nutrition, exercise, stress, sickness, and medications on diabetes control. Discussed carbohydrates, carbohydrate goals per day and meal, along with portion sizes. Patient verbalized understanding of information discussed and has no further questions at this time related to diabetes.   Thanks,  Tama Headings RN, MSN, BC-ADM Inpatient Diabetes Coordinator Team Pager [phone] (8a-5p)

## 2020-06-12 NOTE — Plan of Care (Signed)
  Problem: Fluid Volume: Goal: Hemodynamic stability will improve Outcome: Progressing  BP >100/50, IVF continuous ordered.  Problem: Fluid Volume: Goal: Ability to maintain a balanced intake and output will improve Outcome: Progressing  Foley inserted, IVF ordered.  Problem: Metabolic: Goal: Ability to maintain appropriate glucose levels will improve Outcome: Progressing  Levemir discontinued.

## 2020-06-12 NOTE — Progress Notes (Signed)
MEW score has been yellow and red.   Temp 99.8-102.1 orally, BP 93/56- 108/75 mmHg, HR 100-105, sinus tachycardia on monitor, SPO2 98-100% on 2 LPM of O2 NCL, RR 12-20,  Pt appeared lethargic, oriented x 4. Complained having abdominal pain on left and right lower area. Pt had no pain med order except Tylenol q 5 hr for fever. Pt stated she had no urine output at all in the past 24 hours. We did Bladder scanning and confirmed with 2nd verified staff, we found 82 ml of urine.  I notified Dr. Cyd Silence, the on-call provider tonight. New order received for Fentanyl one time dose 25 mcg, stat renal function panel, renal ultrasound, in and out cath and urine culture and random BUN, Cr and Urine Sodium. In and out cath performed without any difficulties. We got urine 120 ml malodorously and cloudy. Specimen sent to Lab. Result pending.   We will continue to monitor.  Kennyth Lose, RN

## 2020-06-12 NOTE — Progress Notes (Signed)
PROGRESS NOTE    [name]  NGE:[id number] DOB: [birth year] DOA: 06/10/2020 PCP: Charlott Rakes, MD    Brief Narrative:  Patient admitted to the hospital with a working diagnosis of sepsis due to acute pyelonephritis, end-organ failure hypotension, present on admission.  39 year old female with past medical history for type 2 diabetes mellitus, recurrent urinary tract infections (ESBL Klebsiella), hypertension, CKD stage 3b, history of deep vein thrombosis and obesity class 2.  Patient reported 3 days of lower abdominal pain, associated with nausea and vomiting, no frank dysuria.  On July 26 she came to the emergency department but she left without being seen.  On her initial physical examination she was febrile 39.1 C, blood pressure 135/69, heart rate 111, respiratory rate 13, oxygen saturation 100%.  Her lungs are clear to auscultation bilaterally, heart S1-S2, present rhythmic, her abdomen was soft, tender at the suprapubic region, no guarding or rebound, no lower extremity edema. Sodium 133, potassium 4.3, chloride 99, bicarb 22, glucose 508, BUN 34, creatinine 4.0, lipase 23, AST 14, ALT 16, white count 16.8, hemoglobin 8.5, hematocrit 26.2, platelets 302.  SARS COVID-19 negative.  Urinalysis > 500 glucose, > 300 protein, specific gravity 1.017, 21-50 red cells, > 50 white cells. CT of the abdomen and pelvis showed right-sided perinephric stranding and mild inflammatory changes around the bladder.  Chest radiograph with hilar vascular congestion, right base atelectasis, chronic left base scarring.  EKG, 104 bpm, left axis deviation, left bundle branch block, sinus rhythm, Q wave V1-V2, poor R wave progression, no significant ST segment changes, lead I/aVL T wave versions.   Patient has been placed on meropenem for antibiotic therapy and aggressive fluid resuscitation for hypotension.   Patient with progressive worsening renal failure, oliguric.    Assessment & Plan:     Principal Problem:   Severe sepsis ([place]) Active Problems:   DM (diabetes mellitus), type 2, uncontrolled (HCC)   Obesity (BMI 30-39.9)   Anxiety   Depression   HTN (hypertension)   Migraines   Acute pyelonephritis   AKI (acute kidney injury) ([place])   CKD (chronic kidney disease) stage 3, GFR 30-59 ml/min   1.  Sepsis due to acute right pyelonephritis, endorgan failure hypotension and AKI (present on admission). Suspected recurrent multidrug resistant Klebsiella ESBL infection.  Blood pressure has improved, this am is 108/67 mmHg with HR 102 bpm. Patient continue to have elevation in wbc up to 16,9, cultures continue with no growth.   Echocardiogram with LV systolic function 40 to 40%, mild decreased systolic function, with global hypokinesis. IVC with more than 50% respiratory variability, suggesting elevated RA pressure.   Continue antibiotic therapy with IV meropenem. Follow cell count, temperature curve and cultures.   Pain control with low dose fentanyl.    2. 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 Continue worsening renal failure with documented urine output 120 ml per last 24 H. Patient had episodic hypotension yesterday and also received one dose of ibuprofen (07/28).  Today serum cr is 5,91 with a K of 4,3 and serum bicarbonate at 19. P is up to 5,5. Clinically with no signs of volume overload.   Will place a foley catheter for accurate urine output measurement, will change fluids to a bicab drip at 50 ml per H and follow up renal function in am. Avoid hypotension and nephrotoxic medications. Patient with no hypervolemia, severe acidosis or hyperkalemia, no current indication for renal replacement therapy.  If worsening renal function will consult nephrology.   Considering dilated IVC, patient may need diuresis.   Add phoslo for hyperphosphatemia.   3. Uncontrolled T2DM Hgb A1c 7,1.  dyslipidemia fasting glucose is 142 this am. Will reduce base insulin to 12 units daily due to worsening GFR and risk of hypoglycemia. Continue insulin sliding scale for glucose cover and monitoring.   On atorvastatin.   4. Obesity class 2. Calculated BMI is 39.   5. Depression. On ariprazole and amitriptyline.   6. HTN. Holding on clonidine.     Patient continue to be at high risk for worsening renal failure   Status is: Inpatient  Remains inpatient appropriate because:IV treatments appropriate due to intensity of illness or inability to take PO   Dispo: The patient is from: Home              Anticipated d/c is to: Home              Anticipated d/c date is: 3 days              Patient currently is not medically stable to d/c.    DVT prophylaxis: Heparin   Code Status:   full  Family Communication:  No family at the bedside      Antimicrobials:   Meropenem     Subjective: Patient denies any dyspnea or chest pain. Positive lower abdominal and back pain, improved with fentanyl, no nausea or vomiting,   Objective: Vitals:   06/12/20 0400 06/12/20 0431 06/12/20 0500 06/12/20 0802  BP: (!) 109/57 (!) 87/50 (!) 98/54 108/67  Pulse: 100 (!) 107  102  Resp: 15 12  17   Temp:  98.9 F (37.2 C)  99.8 F (37.7 C)  TempSrc:  Oral  Oral  SpO2: 100% 100%  100%  Weight:      Height:        Intake/Output Summary (Last 24 hours) at 06/12/2020 0902 Last data filed at 06/12/2020 7408 Gross per 24 hour  Intake 4896.16 ml  Output 120 ml  Net 4776.16 ml   Filed Weights   06/10/20 0922 06/11/20 0007  Weight: (!) 117.9 kg (!) 124.9 kg    Examination:   General: deconditioned  Neurology: Awake and alert, non focal  E ENT: no pallor, no icterus, oral mucosa moist Cardiovascular: No JVD. S1-S2 present, rhythmic, no gallops, rubs, or murmurs. Trace lower extremity edema. Pulmonary: positive breath sounds bilaterally, adequate air movement, no wheezing, rhonchi or  rales. Gastrointestinal. Abdomen soft and non tender  Skin. No rashes Musculoskeletal: no joint deformities     Data Reviewed: I have personally reviewed following labs and imaging studies  CBC: Recent Labs  Lab 06/08/20 1331 06/10/20 1009 06/12/20 0153  WBC 11.3* 16.8* 16.9*  NEUTROABS  --   --  13.6*  HGB 9.0* 8.5* 7.2*  HCT 27.2* 26.2* 22.1*  MCV 88.9 89.4 89.5  PLT 281 302 144   Basic Metabolic Panel: Recent Labs  Lab 06/08/20 1331 06/10/20 1009 06/12/20 0153  NA 132* 133* 138  K 4.6 4.3 4.3  CL 97* 99 108  CO2 22 22 19*  GLUCOSE 545* 508* 142*  BUN 27* 34* 42*  CREATININE 3.63* 4.05* 5.91*  CALCIUM 8.4* 8.4* 7.8*  PHOS  --   --  5.5*   GFR: Estimated Creatinine Clearance: 18.4 mL/min (A) (by C-G formula based on SCr of 5.91 mg/dL (H)). Liver Function Tests: Recent Labs  Lab 06/08/20 1331 06/10/20 1009 06/12/20 0153  AST 11* 14*  --   ALT 15 16  --   ALKPHOS 79 86  --   BILITOT 0.3 0.5  --   PROT 6.9 6.5  --   ALBUMIN 2.2* 1.6* 1.3*   Recent Labs  Lab 06/08/20 1331 06/10/20 1009  LIPASE 27 23   No results for input(s): AMMONIA in the last 168 hours. Coagulation Profile: Recent Labs  Lab 06/10/20 2220  INR 1.3*   Cardiac Enzymes: No results for input(s): CKTOTAL, CKMB, CKMBINDEX, TROPONINI in the last 168 hours. BNP (last 3 results) No results for input(s): PROBNP in the last 8760 hours. HbA1C: Recent Labs    06/11/20 1041  HGBA1C 12.9*   CBG: Recent Labs  Lab 06/11/20 0629 06/11/20 1059 06/11/20 1521 06/11/20 2108 06/12/20 0619  GLUCAP 260* 141* 74 113* 96   Lipid Profile: No results for input(s): CHOL, HDL, LDLCALC, TRIG, CHOLHDL, LDLDIRECT in the last 72 hours. Thyroid Function Tests: No results for input(s): TSH, T4TOTAL, FREET4, T3FREE, THYROIDAB in the last 72 hours. Anemia Panel: No results for input(s): VITAMINB12, FOLATE, FERRITIN, TIBC, IRON, RETICCTPCT in the last 72 hours.    Radiology Studies: I have  reviewed all of the imaging during this hospital visit personally     Scheduled Meds: . amitriptyline  10 mg Oral QHS  . ARIPiprazole  20 mg Oral Daily  . aspirin EC  81 mg Oral Daily  . atorvastatin  20 mg Oral Daily  . fluticasone  2 spray Each Nare Daily  . heparin  5,000 Units Subcutaneous Q8H  . insulin aspart  0-9 Units Subcutaneous TID WC  . insulin detemir  12 Units Subcutaneous BID   Continuous Infusions: . lactated ringers 100 mL/hr at 06/12/20 0508  . meropenem (MERREM) IV 2 g (06/11/20 2131)     LOS: 2 days        Emmanuelle Coxe Gerome Apley, MD

## 2020-06-13 DIAGNOSIS — G43109 Migraine with aura, not intractable, without status migrainosus: Secondary | ICD-10-CM

## 2020-06-13 LAB — CBC WITH DIFFERENTIAL/PLATELET
Abs Immature Granulocytes: 0.25 10*3/uL — ABNORMAL HIGH (ref 0.00–0.07)
Basophils Absolute: 0 10*3/uL (ref 0.0–0.1)
Basophils Relative: 0 %
Eosinophils Absolute: 0.2 10*3/uL (ref 0.0–0.5)
Eosinophils Relative: 1 %
HCT: 21.6 % — ABNORMAL LOW (ref 36.0–46.0)
Hemoglobin: 7 g/dL — ABNORMAL LOW (ref 12.0–15.0)
Immature Granulocytes: 2 %
Lymphocytes Relative: 8 %
Lymphs Abs: 1.4 10*3/uL (ref 0.7–4.0)
MCH: 29.2 pg (ref 26.0–34.0)
MCHC: 32.4 g/dL (ref 30.0–36.0)
MCV: 90 fL (ref 80.0–100.0)
Monocytes Absolute: 1.2 10*3/uL — ABNORMAL HIGH (ref 0.1–1.0)
Monocytes Relative: 7 %
Neutro Abs: 13.9 10*3/uL — ABNORMAL HIGH (ref 1.7–7.7)
Neutrophils Relative %: 82 %
Platelets: 356 10*3/uL (ref 150–400)
RBC: 2.4 MIL/uL — ABNORMAL LOW (ref 3.87–5.11)
RDW: 12.7 % (ref 11.5–15.5)
WBC: 16.9 10*3/uL — ABNORMAL HIGH (ref 4.0–10.5)
nRBC: 0 % (ref 0.0–0.2)

## 2020-06-13 LAB — URINE CULTURE: Culture: NO GROWTH

## 2020-06-13 LAB — BASIC METABOLIC PANEL
Anion gap: 11 (ref 5–15)
BUN: 47 mg/dL — ABNORMAL HIGH (ref 6–20)
CO2: 21 mmol/L — ABNORMAL LOW (ref 22–32)
Calcium: 7.8 mg/dL — ABNORMAL LOW (ref 8.9–10.3)
Chloride: 105 mmol/L (ref 98–111)
Creatinine, Ser: 6.1 mg/dL — ABNORMAL HIGH (ref 0.44–1.00)
GFR calc Af Amer: 9 mL/min — ABNORMAL LOW (ref >60)
GFR calc non Af Amer: 8 mL/min — ABNORMAL LOW (ref >60)
Glucose, Bld: 172 mg/dL — ABNORMAL HIGH (ref 70–99)
Potassium: 4.5 mmol/L (ref 3.5–5.1)
Sodium: 137 mmol/L (ref 135–145)

## 2020-06-13 LAB — GLUCOSE, CAPILLARY
Glucose-Capillary: 164 mg/dL — ABNORMAL HIGH (ref 70–99)
Glucose-Capillary: 199 mg/dL — ABNORMAL HIGH (ref 70–99)
Glucose-Capillary: 169 mg/dL — ABNORMAL HIGH (ref 70–99)
Glucose-Capillary: 197 mg/dL — ABNORMAL HIGH (ref 70–99)

## 2020-06-13 NOTE — Progress Notes (Signed)
PROGRESS NOTE    [name]  BMW:[id number] DOB: April 16, 1981 DOA: 06/10/2020 PCP: Charlott Rakes, MD    Brief Narrative:  Patient admitted to the hospital with a working diagnosis of sepsis due to acute pyelonephritis,end-organ failure hypotension, present on admission.  39 year old female with past medical history for type 2 diabetes mellitus, recurrent urinary tract infections (ESBL Klebsiella), hypertension, CKD stage 3b,history of deep vein thrombosis and obesityclass 2. Patient reported 3 days of lower abdominal pain, associated with nausea and vomiting, no frank dysuria. On July 26 she came to the emergency department but she left without being seen. On her initial physical examination she was febrile 39.1 C, blood pressure 135/69, heart rate 111, respiratory rate 13, oxygen saturation 100%. Her lungs are clear to auscultation bilaterally, heart S1-S2, present rhythmic, her abdomen was soft, tender at the suprapubic region, no guarding or rebound, no lower extremity edema. Sodium 133, potassium 4.3, chloride 99, bicarb 22, glucose 508, BUN 34, creatinine 4.0, lipase 23, AST 14, ALT 16, white count 16.8, hemoglobin 8.5, hematocrit 26.2, platelets 302. SARS COVID-19 negative. Urinalysis >500 glucose, >300 protein, specific gravity 1.017, 21-50 red cells,>50 white cells. CT of the abdomen and pelvis showed right-sided perinephric stranding and mild inflammatory changes around the bladder. Chest radiograph with hilar vascular congestion, right base atelectasis, chronic left base scarring. EKG, 104bpm, left axis deviation, left bundle branch block, sinus rhythm, Q wave V1-V2, poor R wave progression, no significant ST segment changes, lead I/aVL T wave versions.   Patient has been placed on meropenem for antibiotic therapy and aggressive fluid resuscitation for hypotension.  Patient developed oliguric renal failure, likely related to acute ATN due to hypotension.    Echocardiogram with signs of sepsis induced cardiomyopathy with reduction in LV systolic function and global hypokinesis.    Assessment & Plan:   Principal Problem:   Severe sepsis ([place]) Active Problems:   DM (diabetes mellitus), type 2, uncontrolled (HCC)   Obesity (BMI 30-39.9)   Anxiety   Depression   HTN (hypertension)   Migraines   Acute pyelonephritis   AKI (acute kidney injury) ([place])   CKD (chronic kidney disease) stage 3, GFR 30-59 ml/min   1. Sepsis due to acute right pyelonephritis, endorgan failure hypotension and AKI (present on admission). Suspected recurrent multidrug resistant Klebsiella ESBL infection.  Patient has remained hemodynamically stable, this am blood pressure systolic 272 to 536 mmHg. T max 37.9 C. Continue to have leukocytosis at 16,9.  Urine and blood cultures continue with no growth.   Continue antibiotic therapy with meropenem, follow on cell count, temperature curve and cultures.   Sepsis induced cardiomyopathy Echocardiogram with LV systolic function 40 to 64%, mild decreased systolic function, with global hypokinesis. IVC with more than 50% respiratory variability, suggesting elevated RA pressure.   Patient clinically and hemodynamically improving will hold on diuresis for now.   Continue pain control with low dose fentanyl.    2. AKI on CKD stage 3b (suspected baseline hypertensive and diabetic nephropathy)/ non anion gap metabolic acidosis/ ATN.  Renal US with increased echogenicity both kidneys suggesting chronic renal disease, with right kidney atrophy.   Oliguria has resolved, her urine output over night was 800 cc and today till noon, her urine output has been 600 cc, for a total of 1400 ml.   Her serum cr is stable to today at 6,1 from 5,9, with K at 4,5 and serum bicarbonate at 21 and anion gap at 11. Na 137 and Cl 105  Will  continue bicarb drip at 50 ml per H, avoid nephrotoxic agents and further hypotension. Continue close  monitoring of urine output (foley catheter in place). Phoslo for hyperphosphatemia.   Follow up renal function in am, plus Mg and P.  Will check complement C3 and C4, ANCA, anti GBM, ANA, anti ds DNA, and hepatitis B and C.      3. Uncontrolled T2DM Hgb A1c 7,1/ hypoglucemia. dyslipidemia This am fasting glucose 172.  Basal insulin has been discontinued due to episodic hypoglycemia, will continue insulin sliding scale for glucose cover and monitoring  Continue with atorvastatin.   4. Obesity class 2. Calculated BMI is 39. High risk for inpatient complications  5. Depression. Continue with ariprazole and amitriptyline.  6. HTN. Continue holding on clonidine., due to risk of hypotension.   7. Iron deficiency anemia chronic. Hgb down to 7,0 with hct at 21,6. Will check iron panel, hold on PRBC transfusion for now.  Her serum iron in 2013 was less than 10.   Patient continue to be at high risk for worsening renal function and hypotension.   Status is: Inpatient  Remains inpatient appropriate because:IV treatments appropriate due to intensity of illness or inability to take PO   Dispo: The patient is from: Home              Anticipated d/c is to: Home              Anticipated d/c date is: 3 days              Patient currently is not medically stable to d/c.   DVT prophylaxis: Heparin   Code Status:   full  Family Communication:  No family at the bedside     Antimicrobials:   Merpenem.     Subjective: Patient is feeling better, but not yet back to baseline, continue to have intermittent back pain, no nausea or vomiting, no chest pain or dyspnea,   Objective: Vitals:   06/12/20 2118 06/13/20 0048 06/13/20 0526 06/13/20 0936  BP: 99/81 (!) 127/63 110/65 116/67  Pulse: 104 (!) 107 (!) 111   Resp: 19 21 16    Temp: 98.8 F (37.1 C) 98.9 F (37.2 C) 99.4 F (37.4 C) 98.8 F (37.1 C)  TempSrc: Oral Oral Oral   SpO2: 100% 100% 100%   Weight:      Height:         Intake/Output Summary (Last 24 hours) at 06/13/2020 1152 Last data filed at 06/13/2020 0300 Gross per 24 hour  Intake 1168.29 ml  Output 800 ml  Net 368.29 ml   Filed Weights   06/10/20 0922 06/11/20 0007  Weight: (!) 117.9 kg (!) 124.9 kg    Examination:   General: deconditioned  Neurology: Awake and alert, non focal  E ENT: no pallor, no icterus, oral mucosa moist Cardiovascular: No JVD. S1-S2 present, rhythmic, no gallops, rubs, or murmurs. Trace lower extremity edema. Pulmonary: positive breath sounds bilaterally, adequate air movement, no wheezing, rhonchi or rales. Gastrointestinal. Abdomen soft and non tender Skin. No rashes Musculoskeletal: no joint deformities     Data Reviewed: I have personally reviewed following labs and imaging studies  CBC: Recent Labs  Lab 06/08/20 1331 06/10/20 1009 06/12/20 0153 06/13/20 0802  WBC 11.3* 16.8* 16.9* 16.9*  NEUTROABS  --   --  13.6* 13.9*  HGB 9.0* 8.5* 7.2* 7.0*  HCT 27.2* 26.2* 22.1* 21.6*  MCV 88.9 89.4 89.5 90.0  PLT 281 302 272 356   Basic  Metabolic Panel: Recent Labs  Lab 06/08/20 1331 06/10/20 1009 06/12/20 0153 06/13/20 0802  NA 132* 133* 138 137  K 4.6 4.3 4.3 4.5  CL 97* 99 108 105  CO2 22 22 19* 21*  GLUCOSE 545* 508* 142* 172*  BUN 27* 34* 42* 47*  CREATININE 3.63* 4.05* 5.91* 6.10*  CALCIUM 8.4* 8.4* 7.8* 7.8*  PHOS  --   --  5.5*  --    GFR: Estimated Creatinine Clearance: 17.8 mL/min (A) (by C-G formula based on SCr of 6.1 mg/dL (H)). Liver Function Tests: Recent Labs  Lab 06/08/20 1331 06/10/20 1009 06/12/20 0153  AST 11* 14*  --   ALT 15 16  --   ALKPHOS 79 86  --   BILITOT 0.3 0.5  --   PROT 6.9 6.5  --   ALBUMIN 2.2* 1.6* 1.3*   Recent Labs  Lab 06/08/20 1331 06/10/20 1009  LIPASE 27 23   No results for input(s): AMMONIA in the last 168 hours. Coagulation Profile: Recent Labs  Lab 06/10/20 2220  INR 1.3*   Cardiac Enzymes: No results for input(s): CKTOTAL,  CKMB, CKMBINDEX, TROPONINI in the last 168 hours. BNP (last 3 results) No results for input(s): PROBNP in the last 8760 hours. HbA1C: Recent Labs    06/11/20 1041  HGBA1C 12.9*   CBG: Recent Labs  Lab 06/12/20 0619 06/12/20 1235 06/12/20 1705 06/12/20 2135 06/13/20 0620  GLUCAP 96 70 131* 150* 164*   Lipid Profile: No results for input(s): CHOL, HDL, LDLCALC, TRIG, CHOLHDL, LDLDIRECT in the last 72 hours. Thyroid Function Tests: No results for input(s): TSH, T4TOTAL, FREET4, T3FREE, THYROIDAB in the last 72 hours. Anemia Panel: No results for input(s): VITAMINB12, FOLATE, FERRITIN, TIBC, IRON, RETICCTPCT in the last 72 hours.    Radiology Studies: I have reviewed all of the imaging during this hospital visit personally     Scheduled Meds: . amitriptyline  10 mg Oral QHS  . aspirin EC  81 mg Oral Daily  . atorvastatin  20 mg Oral Daily  . calcium acetate  667 mg Oral TID WC  . Chlorhexidine Gluconate Cloth  6 each Topical Daily  . fluticasone  2 spray Each Nare Daily  . heparin  5,000 Units Subcutaneous Q8H  . insulin aspart  0-9 Units Subcutaneous TID WC   Continuous Infusions: . meropenem (MERREM) IV 1 g (06/13/20 1006)  .  sodium bicarbonate (isotonic) infusion in sterile water 50 mL/hr at 06/13/20 0517     LOS: 3 days        Kamrin Spath Gerome Apley, MD

## 2020-06-14 ENCOUNTER — Inpatient Hospital Stay (HOSPITAL_COMMUNITY): Payer: BC Managed Care – PPO

## 2020-06-14 LAB — BASIC METABOLIC PANEL
Anion gap: 11 (ref 5–15)
BUN: 49 mg/dL — ABNORMAL HIGH (ref 6–20)
CO2: 25 mmol/L (ref 22–32)
Calcium: 8 mg/dL — ABNORMAL LOW (ref 8.9–10.3)
Chloride: 102 mmol/L (ref 98–111)
Creatinine, Ser: 5.68 mg/dL — ABNORMAL HIGH (ref 0.44–1.00)
GFR calc Af Amer: 10 mL/min — ABNORMAL LOW (ref >60)
GFR calc non Af Amer: 9 mL/min — ABNORMAL LOW (ref >60)
Glucose, Bld: 203 mg/dL — ABNORMAL HIGH (ref 70–99)
Potassium: 4.5 mmol/L (ref 3.5–5.1)
Sodium: 138 mmol/L (ref 135–145)

## 2020-06-14 LAB — HEPATITIS C ANTIBODY: HCV Ab: NONREACTIVE

## 2020-06-14 LAB — IRON AND TIBC
Iron: 24 ug/dL — ABNORMAL LOW (ref 28–170)
Saturation Ratios: 23 % (ref 10.4–31.8)
TIBC: 104 ug/dL — ABNORMAL LOW (ref 250–450)
UIBC: 80 ug/dL

## 2020-06-14 LAB — CBC WITH DIFFERENTIAL/PLATELET
Abs Immature Granulocytes: 0.26 10*3/uL — ABNORMAL HIGH (ref 0.00–0.07)
Basophils Absolute: 0 10*3/uL (ref 0.0–0.1)
Basophils Relative: 0 %
Eosinophils Absolute: 0.2 10*3/uL (ref 0.0–0.5)
Eosinophils Relative: 1 %
HCT: 22.6 % — ABNORMAL LOW (ref 36.0–46.0)
Hemoglobin: 7.3 g/dL — ABNORMAL LOW (ref 12.0–15.0)
Immature Granulocytes: 2 %
Lymphocytes Relative: 10 %
Lymphs Abs: 1.7 10*3/uL (ref 0.7–4.0)
MCH: 28.9 pg (ref 26.0–34.0)
MCHC: 32.3 g/dL (ref 30.0–36.0)
MCV: 89.3 fL (ref 80.0–100.0)
Monocytes Absolute: 0.9 10*3/uL (ref 0.1–1.0)
Monocytes Relative: 5 %
Neutro Abs: 14.5 10*3/uL — ABNORMAL HIGH (ref 1.7–7.7)
Neutrophils Relative %: 82 %
Platelets: 383 10*3/uL (ref 150–400)
RBC: 2.53 MIL/uL — ABNORMAL LOW (ref 3.87–5.11)
RDW: 12.7 % (ref 11.5–15.5)
WBC: 17.6 10*3/uL — ABNORMAL HIGH (ref 4.0–10.5)
nRBC: 0 % (ref 0.0–0.2)

## 2020-06-14 LAB — GLUCOSE, CAPILLARY
Glucose-Capillary: 242 mg/dL — ABNORMAL HIGH (ref 70–99)
Glucose-Capillary: 200 mg/dL — ABNORMAL HIGH (ref 70–99)
Glucose-Capillary: 207 mg/dL — ABNORMAL HIGH (ref 70–99)
Glucose-Capillary: 141 mg/dL — ABNORMAL HIGH (ref 70–99)
Glucose-Capillary: 151 mg/dL — ABNORMAL HIGH (ref 70–99)

## 2020-06-14 LAB — TRANSFERRIN: Transferrin: 74 mg/dL — ABNORMAL LOW (ref 192–382)

## 2020-06-14 LAB — PHOSPHORUS: Phosphorus: 5.5 mg/dL — ABNORMAL HIGH (ref 2.5–4.6)

## 2020-06-14 LAB — UREA NITROGEN, URINE: Urea Nitrogen, Ur: 212 mg/dL

## 2020-06-14 LAB — FERRITIN: Ferritin: 1544 ng/mL — ABNORMAL HIGH (ref 11–307)

## 2020-06-14 MED ORDER — SODIUM CHLORIDE 0.45 % IV SOLN: INTRAVENOUS | Status: DC

## 2020-06-14 MED ORDER — POLYETHYLENE GLYCOL 3350 17 G PO PACK
17.0000 g | PACK | Freq: Two times a day (BID) | ORAL | Status: DC
  Administered 2020-06-14 (×2): 17 g via ORAL
  Filled 2020-06-14 (×5): qty 1

## 2020-06-14 MED ORDER — FUROSEMIDE 10 MG/ML IJ SOLN
80.0000 mg | Freq: Once | INTRAMUSCULAR | Status: AC
  Administered 2020-06-14: 80 mg via INTRAVENOUS
  Filled 2020-06-14: qty 8

## 2020-06-14 MED ORDER — METOPROLOL TARTRATE 5 MG/5ML IV SOLN
5.0000 mg | INTRAVENOUS | Status: DC | PRN
  Filled 2020-06-14: qty 5

## 2020-06-14 NOTE — Progress Notes (Signed)
Pt woke up with cp, N & V. VS stat provider on call notified, advised to administer the Fentanyl. Will continue to monitor.

## 2020-06-14 NOTE — Progress Notes (Signed)
Patient with only 500 cc of urine for the day shift, after furosemide 80 mg IV.  Documented blood pressure 98/64 at 16:48  Will resume IV fluids with 0.45% saline and will consult nephrology.

## 2020-06-14 NOTE — Progress Notes (Signed)
PROGRESS NOTE    [name]  XHB:[id number] DOB: 09-26-1981 DOA: 06/10/2020 PCP: Charlott Rakes, MD    Brief Narrative:  Patient admitted to the hospital with a working diagnosis of sepsis due to acute pyelonephritis,end-organ failure hypotension and AKI present on admission.  39 year old female with past medical history for type 2 diabetes mellitus, recurrent urinary tract infections (ESBL Klebsiella), hypertension, CKD stage 3b,history of deep vein thrombosis and obesityclass 2. Patient reported 3 days of lower abdominal pain, associated with nausea and vomiting, no frank dysuria. On July 26 she came to the emergency department but she left without being seen. On her initial physical examination she was febrile 39.1 C, blood pressure 135/69, heart rate 111, respiratory rate 13, oxygen saturation 100%. Her lungs are clear to auscultation bilaterally, heart S1-S2, present rhythmic, her abdomen was soft, tender at the suprapubic region, no guarding or rebound, no lower extremity edema. Sodium 133, potassium 4.3, chloride 99, bicarb 22, glucose 508, BUN 34, creatinine 4.0, lipase 23, AST 14, ALT 16, white count 16.8, hemoglobin 8.5, hematocrit 26.2, platelets 302. SARS COVID-19 negative. Urinalysis >500 glucose, >300 protein, specific gravity 1.017, 21-50 red cells,>50 white cells. CT of the abdomen and pelvis showed right-sided perinephric stranding and mild inflammatory changes around the bladder. Chest radiograph with hilar vascular congestion, right base atelectasis, chronic left base scarring. EKG, 104bpm, left axis deviation, left bundle branch block, sinus rhythm, Q wave V1-V2, poor R wave progression, no significant ST segment changes, lead I/aVL T wave versions.   Patient has been placed on meropenem for antibiotic therapy and aggressive fluid resuscitation for hypotension.  Patient developed oliguric renal failure, likely related to acute ATN due to  hypotension.  Echocardiogram with signs of sepsis induced cardiomyopathy with reduction in LV systolic function and global hypokinesis.  Today with signs of pulmonary edema, due to volume overload, renal function starting to improve per serum cr and urine output.    Assessment & Plan:   Principal Problem:   Severe sepsis ([name]) Active Problems:   DM (diabetes mellitus), type 2, uncontrolled (HCC)   Obesity (BMI 30-39.9)   Anxiety   Depression   HTN (hypertension)   Migraines   Acute pyelonephritis   AKI (acute kidney injury) ([place])   CKD (chronic kidney disease) stage 3, GFR 30-59 ml/min   1. Sepsis due to acute right pyelonephritis, endorgan failure hypotensionand AKI(present on admission). Suspected recurrent multidrug resistant Klebsiella ESBL infection. No further hypotensive episodes, systolic blood pressure has been 110 to 134 mmHg. Persistent leukocytosis at 17,6. Cultures continue with no growth, but culture from 07/06 positive for ESBL Klebsiella.   On meropenem #5. Continue close hemodynamic monitoring, follow cell count.   Sepsis induced cardiomyopathy Echocardiogram with LV systolic function 40 to 81%, mild decreased systolic function, with global hypokinesis. IVC with more than 50% respiratory variability, suggesting elevated RA pressure.   Chest film from this am personally reviewed, noted bilateral interstitial infiltrates, consistent with acute pulmonary edema due to volume overload.   Will give one dose of furosemide 80 mg x1.   Pain control with fentanyl.   2.AKI on CKD stage 3b(suspected baseline hypertensive and diabetic nephropathy)/ non anion gap metabolic acidosis/ ATN. Renal US with increased echogenicity both kidneys suggesting chronic renal disease, with right kidney atrophy.  Pending complement C3 and C4, ANCA, anti GBM, ANA, anti ds DNA, and hepatitis B and C.    Her urine output over last 24 H is 1000 ml, renal function with serum cr at  5,68, with serum bicarbonate at 25, K is 4,5 and P is 5.5.   Will give one dose of furosemide 80 mg X1, and follow on response. Avoid nephrotoxic agents or hypotension.   Discontinue bicarb drip. Continue phosphate binders.   3. Uncontrolled T2DM Hgb A1c 7,1/ hypoglucemia. dyslipidemiaThis am fasting glucose 203.  On insulin sliding scale for glucose cover and monitoring, continue to hold on long acting insulin for now.   Continue withatorvastatin.   4. Obesity class 2.Calculated BMI is 39. she continue to be at high risk for inpatient complications.   5. Depression.Continue withariprazole and amitriptyline.  6. HTN. Holding all antihypertensive medications, to prevent hypotension.   7. Iron deficiency anemia/ anemia of chronic renal disease. Serum iron is 24, transferrin saturation is 23, tibc 104, ferritin 1,544. Transferrin 74.  Hgb this am is 7,3, will hold on PRBC transfusion for now.   Patient continue to be at high risk for worsening sepsis and renal failure.   Status is: Inpatient  Remains inpatient appropriate because:IV treatments appropriate due to intensity of illness or inability to take PO   Dispo: The patient is from: Home              Anticipated d/c is to: Home              Anticipated d/c date is: 3 days              Patient currently is not medically stable to d/c.   DVT prophylaxis: Heparin   Code Status:   full  Family Communication:  No family at the bedside       Antimicrobials:   Meropenem.     Subjective: Not yet back to baseline, no nausea or vomiting, tolerating po well. Last night had episode of severe dyspnea and chest pressure. Chest film was done.   Objective: Vitals:   06/13/20 1738 06/13/20 2123 06/14/20 0108 06/14/20 0325  BP:  (!) 110/64 (!) 131/74 (!) 134/77  Pulse:  (!) 111 (!) 117 (!) 111  Resp:  16 17   Temp: (!) 100.6 F (38.1 C) 99.1 F (37.3 C) 100.1 F (37.8 C) 99.6 F (37.6 C)  TempSrc:  Oral Oral Oral   SpO2:  99% 99%   Weight:      Height:        Intake/Output Summary (Last 24 hours) at 06/14/2020 0817 Last data filed at 06/13/2020 2127 Gross per 24 hour  Intake --  Output 1000 ml  Net -1000 ml   Filed Weights   06/10/20 0922 06/11/20 0007  Weight: (!) 117.9 kg (!) 124.9 kg    Examination:   General: deconditioned  Neurology: Awake and alert, non focal  E ENT: no pallor, no icterus, oral mucosa moist Cardiovascular: No JVD. S1-S2 present, rhythmic, no gallops, rubs, or murmurs. No lower extremity edema. Pulmonary: positive breath sounds bilaterally, a no wheezing, or rhonchi, positive scattered rales. Decreased breaths sounds at bases Gastrointestinal. Abdomen soft and non tender Skin. No rashes Musculoskeletal: no joint deformities     Data Reviewed: I have personally reviewed following labs and imaging studies  CBC: Recent Labs  Lab 06/08/20 1331 06/10/20 1009 06/12/20 0153 06/13/20 0802 06/14/20 0257  WBC 11.3* 16.8* 16.9* 16.9* 17.6*  NEUTROABS  --   --  13.6* 13.9* 14.5*  HGB 9.0* 8.5* 7.2* 7.0* 7.3*  HCT 27.2* 26.2* 22.1* 21.6* 22.6*  MCV 88.9 89.4 89.5 90.0 89.3  PLT 281 302 272 356 383   Basic  Metabolic Panel: Recent Labs  Lab 06/08/20 1331 06/10/20 1009 06/12/20 0153 06/13/20 0802 06/14/20 0257  NA 132* 133* 138 137 138  K 4.6 4.3 4.3 4.5 4.5  CL 97* 99 108 105 102  CO2 22 22 19* 21* 25  GLUCOSE 545* 508* 142* 172* 203*  BUN 27* 34* 42* 47* 49*  CREATININE 3.63* 4.05* 5.91* 6.10* 5.68*  CALCIUM 8.4* 8.4* 7.8* 7.8* 8.0*  PHOS  --   --  5.5*  --  5.5*   GFR: Estimated Creatinine Clearance: 19.1 mL/min (A) (by C-G formula based on SCr of 5.68 mg/dL (H)). Liver Function Tests: Recent Labs  Lab 06/08/20 1331 06/10/20 1009 06/12/20 0153  AST 11* 14*  --   ALT 15 16  --   ALKPHOS 79 86  --   BILITOT 0.3 0.5  --   PROT 6.9 6.5  --   ALBUMIN 2.2* 1.6* 1.3*   Recent Labs  Lab 06/08/20 1331 06/10/20 1009  LIPASE 27 23   No results  for input(s): AMMONIA in the last 168 hours. Coagulation Profile: Recent Labs  Lab 06/10/20 2220  INR 1.3*   Cardiac Enzymes: No results for input(s): CKTOTAL, CKMB, CKMBINDEX, TROPONINI in the last 168 hours. BNP (last 3 results) No results for input(s): PROBNP in the last 8760 hours. HbA1C: Recent Labs    06/11/20 1041  HGBA1C 12.9*   CBG: Recent Labs  Lab 06/13/20 0620 06/13/20 1455 06/13/20 1730 06/13/20 2131 06/14/20 0633  GLUCAP 164* 199* 169* 197* 242*   Lipid Profile: No results for input(s): CHOL, HDL, LDLCALC, TRIG, CHOLHDL, LDLDIRECT in the last 72 hours. Thyroid Function Tests: No results for input(s): TSH, T4TOTAL, FREET4, T3FREE, THYROIDAB in the last 72 hours. Anemia Panel: Recent Labs    06/14/20 0257  FERRITIN 1,544*  TIBC 104*  IRON 24*      Radiology Studies: I have reviewed all of the imaging during this hospital visit personally     Scheduled Meds: . amitriptyline  10 mg Oral QHS  . aspirin EC  81 mg Oral Daily  . atorvastatin  20 mg Oral Daily  . calcium acetate  667 mg Oral TID WC  . Chlorhexidine Gluconate Cloth  6 each Topical Daily  . fluticasone  2 spray Each Nare Daily  . heparin  5,000 Units Subcutaneous Q8H  . insulin aspart  0-9 Units Subcutaneous TID WC   Continuous Infusions: . meropenem (MERREM) IV 1 g (06/13/20 2133)     LOS: 4 days        Ann Amoroso Gerome Apley, MD

## 2020-06-15 DIAGNOSIS — F329 Major depressive disorder, single episode, unspecified: Secondary | ICD-10-CM

## 2020-06-15 DIAGNOSIS — I10 Essential (primary) hypertension: Secondary | ICD-10-CM

## 2020-06-15 LAB — CBC WITH DIFFERENTIAL/PLATELET
Abs Immature Granulocytes: 0.21 10*3/uL — ABNORMAL HIGH (ref 0.00–0.07)
Basophils Absolute: 0 10*3/uL (ref 0.0–0.1)
Basophils Relative: 0 %
Eosinophils Absolute: 0.2 10*3/uL (ref 0.0–0.5)
Eosinophils Relative: 1 %
HCT: 20.1 % — ABNORMAL LOW (ref 36.0–46.0)
Hemoglobin: 6.5 g/dL — CL (ref 12.0–15.0)
Immature Granulocytes: 1 %
Lymphocytes Relative: 16 %
Lymphs Abs: 2.4 10*3/uL (ref 0.7–4.0)
MCH: 29.5 pg (ref 26.0–34.0)
MCHC: 32.3 g/dL (ref 30.0–36.0)
MCV: 91.4 fL (ref 80.0–100.0)
Monocytes Absolute: 1.4 10*3/uL — ABNORMAL HIGH (ref 0.1–1.0)
Monocytes Relative: 9 %
Neutro Abs: 11 10*3/uL — ABNORMAL HIGH (ref 1.7–7.7)
Neutrophils Relative %: 73 %
Platelets: 362 10*3/uL (ref 150–400)
RBC: 2.2 MIL/uL — ABNORMAL LOW (ref 3.87–5.11)
RDW: 12.7 % (ref 11.5–15.5)
WBC: 15.2 10*3/uL — ABNORMAL HIGH (ref 4.0–10.5)
nRBC: 0 % (ref 0.0–0.2)

## 2020-06-15 LAB — HEPATITIS B SURFACE ANTIBODY, QUANTITATIVE: Hep B S AB Quant (Post): <3.1 m[IU]/mL — ABNORMAL LOW (ref >9.9)

## 2020-06-15 LAB — PROTEIN / CREATININE RATIO, URINE
Creatinine, Urine: 228.49 mg/dL
Protein Creatinine Ratio: 4.41 mg/mg{Cre} — ABNORMAL HIGH (ref 0.00–0.15)
Total Protein, Urine: 1008 mg/dL

## 2020-06-15 LAB — BASIC METABOLIC PANEL
Anion gap: 12 (ref 5–15)
BUN: 57 mg/dL — ABNORMAL HIGH (ref 6–20)
CO2: 23 mmol/L (ref 22–32)
Calcium: 8.2 mg/dL — ABNORMAL LOW (ref 8.9–10.3)
Chloride: 102 mmol/L (ref 98–111)
Creatinine, Ser: 6.72 mg/dL — ABNORMAL HIGH (ref 0.44–1.00)
GFR calc Af Amer: 8 mL/min — ABNORMAL LOW (ref >60)
GFR calc non Af Amer: 7 mL/min — ABNORMAL LOW (ref >60)
Glucose, Bld: 165 mg/dL — ABNORMAL HIGH (ref 70–99)
Potassium: 4.9 mmol/L (ref 3.5–5.1)
Sodium: 137 mmol/L (ref 135–145)

## 2020-06-15 LAB — CULTURE, BLOOD (ROUTINE X 2)
Culture: NO GROWTH
Culture: NO GROWTH
Special Requests: ADEQUATE
Special Requests: ADEQUATE

## 2020-06-15 LAB — GLUCOSE, CAPILLARY
Glucose-Capillary: 162 mg/dL — ABNORMAL HIGH (ref 70–99)
Glucose-Capillary: 194 mg/dL — ABNORMAL HIGH (ref 70–99)
Glucose-Capillary: 164 mg/dL — ABNORMAL HIGH (ref 70–99)
Glucose-Capillary: 237 mg/dL — ABNORMAL HIGH (ref 70–99)

## 2020-06-15 LAB — C4 COMPLEMENT: Complement C4, Body Fluid: 48 mg/dL — ABNORMAL HIGH (ref 12–38)

## 2020-06-15 LAB — ANCA TITERS
Atypical P-ANCA titer: <1:20 {titer}
C-ANCA: <1:20 {titer}
P-ANCA: <1:20 {titer}

## 2020-06-15 LAB — C3 COMPLEMENT: C3 Complement: 168 mg/dL — ABNORMAL HIGH (ref 82–167)

## 2020-06-15 LAB — GLOMERULAR BASEMENT MEMBRANE ANTIBODIES: GBM Ab: 3 units (ref 0–20)

## 2020-06-15 MED ORDER — SODIUM CHLORIDE 0.9 % IV SOLN
510.0000 mg | Freq: Once | INTRAVENOUS | Status: AC
  Administered 2020-06-15: 510 mg via INTRAVENOUS
  Filled 2020-06-15: qty 17

## 2020-06-15 MED ORDER — SODIUM ZIRCONIUM CYCLOSILICATE 10 G PO PACK
10.0000 g | PACK | Freq: Three times a day (TID) | ORAL | Status: AC
  Administered 2020-06-15 (×3): 10 g via ORAL
  Filled 2020-06-15 (×2): qty 1

## 2020-06-15 MED ORDER — DARBEPOETIN ALFA 300 MCG/0.6ML IJ SOSY
300.0000 ug | PREFILLED_SYRINGE | INTRAMUSCULAR | Status: DC
  Administered 2020-06-15 – 2020-06-22 (×2): 300 ug via SUBCUTANEOUS
  Filled 2020-06-15 (×2): qty 0.6

## 2020-06-15 NOTE — Progress Notes (Signed)
Floor coverage  Hemoglobin 6.5 this morning, was 7.3 yesterday.  Discussed with the patient.  

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
## 2020-06-15 NOTE — Progress Notes (Signed)
PROGRESS NOTE    [name]  NFA:[id number] DOB: [birth year] DOA: 06/10/2020 PCP: Charlott Rakes, MD    Brief Narrative:  Patient admitted to the hospital with a working diagnosis of sepsis due to acute pyelonephritis,end-organ failure hypotension and AKI present on admission.  39 year old female with past medical history for type 2 diabetes mellitus, recurrent urinary tract infections (ESBL Klebsiella), hypertension, CKD stage 3b,history of deep vein thrombosis and obesityclass 2. Patient reported 3 days of lower abdominal pain, associated with nausea and vomiting, no frank dysuria. On July 26 she came to the emergency department but she left without being seen. On her initial physical examination she was febrile 39.1 C, blood pressure 135/69, heart rate 111, respiratory rate 13, oxygen saturation 100%. Her lungs are clear to auscultation bilaterally, heart S1-S2, present rhythmic, her abdomen was soft, tender at the suprapubic region, no guarding or rebound, no lower extremity edema. Sodium 133, potassium 4.3, chloride 99, bicarb 22, glucose 508, BUN 34, creatinine 4.0, lipase 23, AST 14, ALT 16, white count 16.8, hemoglobin 8.5, hematocrit 26.2, platelets 302. SARS COVID-19 negative. Urinalysis >500 glucose, >300 protein, specific gravity 1.017, 21-50 red cells,>50 white cells. CT of the abdomen and pelvis showed right-sided perinephric stranding and mild inflammatory changes around the bladder. Chest radiograph with hilar vascular congestion, right base atelectasis, chronic left base scarring. EKG, 104bpm, left axis deviation, left bundle branch block, sinus rhythm, Q wave V1-V2, poor R wave progression, no significant ST segment changes, lead I/aVL T wave versions.   Patient has been placed on meropenem for antibiotic therapy and aggressive fluid resuscitation for hypotension.  Patient developed oliguric renal failure, likely related to acute ATN due to  hypotension.  Echocardiogram with signs of sepsis induced cardiomyopathy with reduction in LV systolic function and global hypokinesis.  Patient developing signs of acute pulmonary edema, due to volume overload. Serum cr has been between 5 and 6, with urine output more than 500 cc per day. Did not respond to IV furosemide, and now developing hyperKalemia.    Dropping Hgb below 7, declining PRBC transfusion due to [name]   Assessment & Plan:   Principal Problem:   Severe sepsis ([place]) Active Problems:   DM (diabetes mellitus), type 2, uncontrolled ([place])   Obesity (BMI 30-39.9)   Anxiety   Depression   HTN (hypertension)   Migraines   Acute pyelonephritis   AKI (acute kidney injury) ([place])   CKD (chronic kidney disease) stage 3, GFR 30-59 ml/min    1. Sepsis due to acute right pyelonephritis, endorgan failure hypotensionand AKI(present on admission). Suspected multidrug resistant Klebsiella ESBL infection.Current cultures no growth, but urine culture from 07/06 positive for ESBL Klebsiella.  Lowest systolic blood pressure yesterday was 92 mmHg, this am is up to 121 mmHg. Wbc now trending down to 15, 2 from 17,6.    Continue meropenem #6/8.   Sepsis induced cardiomyopathyEchocardiogram with LV systolic function 40 to 46%, mild decreased systolic function, with global hypokinesis. IVC with more than 50% respiratory variability, suggesting elevated RA pressure.  Oxygenation is 99% on room air, dyspnea has been stable, patient did received furosemide yesterday for signs of volume overload.   Continue pain control with fentanyl.   2.AKI on CKD stage 3b(suspectedbaselinehypertensive and diabetic nephropathy)/ non anion gap metabolic acidosis/ ATN. Renal US with increased echogenicity both kidneys suggesting chronic renal disease, with right kidney atrophy.   Pending work up with: complement C3 and C4, ANCA, anti GBM, ANA, anti ds DNA, and hepatitis B  and  C.  Urine output has been worsening despite furosemide 80 mg Iv x1, over last 24 H has been 675 ml.  K is up to 4,9 and serum bicarbonate at 23, BUN 57 and serum cr at 6,72.   Considering worsening renal function, signs of early volume overload and hyperkalemia, not responsive to 80 mg IV furosemide will consult nephrology for further recommendations.   For now will continue 0,45% NS at 50 ml per H, [place] and will add sodium zirconium. Patient continue to decline PRBC transfusion.   3. Uncontrolled T2DM Hgb A1c 7,1/ hypoglucemia. dyslipidemiaThis am fasting glucose165  Continue with insulin sliding scale for glucose cover and monitoring. Holding on long acting insulin for now.   On atorvastatin.   4. Obesity class 2.Calculated BMI is 39.At high risk for inpatient complications.   5. Depression.On ariprazole and amitriptyline.  6. HTN.Continue to hold all antihypertensive medications due to worsening renal function and risk of hypotension.  7. Iron deficiency anemia/ anemia of chronic renal disease. Serum iron is 24, transferrin saturation is 23, tibc 104, ferritin 1,544. Transferrin 74.  This am Hgb has drooped to 6,5 with Hct 20.1. I explained [name] that PRBC transfusion may help renal function, but she continue to decline this intervention.   Patient continue to be at high risk for worsening renal failure.   Status is: Inpatient  Remains inpatient appropriate because:Inpatient level of care appropriate due to severity of illness   Dispo: The patient is from: Home              Anticipated d/c is to: Home              Anticipated d/c date is: 3 days              Patient currently is not medically stable to d/c.   DVT prophylaxis: Heparin   Code Status:   full  Family Communication:  No family at the bedside      Consultants:   Nephrology     Antimicrobials:   Meropenem     Subjective: Patient with stable dyspnea, no nausea or  vomiting, no chest pain. Continue to decline PRBC transfusion.   Objective: Vitals:   06/14/20 1648 06/14/20 2216 06/15/20 0050 06/15/20 0623  BP: (!) 98/64 (!) 92/52 (!) 128/60 (!) 121/59  Pulse: 59 62 58 59  Resp: 20 18 18 18   Temp: 100 F (37.8 C) 99.2 F (37.3 C) 99 F (37.2 C) 99.2 F (37.3 C)  TempSrc: Oral Oral Oral Oral  SpO2: 98% 99% 100% 99%  Weight:      Height:        Intake/Output Summary (Last 24 hours) at 06/15/2020 0817 Last data filed at 06/15/2020 7628 Gross per 24 hour  Intake --  Output 675 ml  Net -675 ml   Filed Weights   06/10/20 0922 06/11/20 0007  Weight: (!) 117.9 kg (!) 124.9 kg    Examination:   General: deconditioned  Neurology: Awake and alert, non focal . Mild dyspnea at rest.  E ENT: no pallor, no icterus, oral mucosa moist Cardiovascular: No JVD. S1-S2 present, rhythmic, no gallops, rubs, or murmurs. Trace lower extremity edema. Pulmonary: positive breath sounds bilaterally, no wheezing, or rhonchi, mild rales at bases Gastrointestinal. Abdomen soft and non tenderSkin. No rashes Musculoskeletal: no joint deformities     Data Reviewed: I have personally reviewed following labs and imaging studies  CBC: Recent Labs  Lab 06/10/20 1009 06/12/20 0153 06/13/20  0802 06/14/20 0257 06/15/20 0433  WBC 16.8* 16.9* 16.9* 17.6* 15.2*  NEUTROABS  --  13.6* 13.9* 14.5* 11.0*  HGB 8.5* 7.2* 7.0* 7.3* 6.5*  HCT 26.2* 22.1* 21.6* 22.6* 20.1*  MCV 89.4 89.5 90.0 89.3 91.4  PLT 302 272 356 383 621   Basic Metabolic Panel: Recent Labs  Lab 06/10/20 1009 06/12/20 0153 06/13/20 0802 06/14/20 0257 06/15/20 0433  NA 133* 138 137 138 137  K 4.3 4.3 4.5 4.5 4.9  CL 99 108 105 102 102  CO2 22 19* 21* 25 23  GLUCOSE 508* 142* 172* 203* 165*  BUN 34* 42* 47* 49* 57*  CREATININE 4.05* 5.91* 6.10* 5.68* 6.72*  CALCIUM 8.4* 7.8* 7.8* 8.0* 8.2*  PHOS  --  5.5*  --  5.5*  --    GFR: Estimated Creatinine Clearance: 16.2 mL/min (A) (by C-G  formula based on SCr of 6.72 mg/dL (H)). Liver Function Tests: Recent Labs  Lab 06/08/20 1331 06/10/20 1009 06/12/20 0153  AST 11* 14*  --   ALT 15 16  --   ALKPHOS 79 86  --   BILITOT 0.3 0.5  --   PROT 6.9 6.5  --   ALBUMIN 2.2* 1.6* 1.3*   Recent Labs  Lab 06/08/20 1331 06/10/20 1009  LIPASE 27 23   No results for input(s): AMMONIA in the last 168 hours. Coagulation Profile: Recent Labs  Lab 06/10/20 2220  INR 1.3*   Cardiac Enzymes: No results for input(s): CKTOTAL, CKMB, CKMBINDEX, TROPONINI in the last 168 hours. BNP (last 3 results) No results for input(s): PROBNP in the last 8760 hours. HbA1C: No results for input(s): HGBA1C in the last 72 hours. CBG: Recent Labs  Lab 06/14/20 0633 06/14/20 0902 06/14/20 1104 06/14/20 1646 06/14/20 2214  GLUCAP 242* 200* 207* 141* 151*   Lipid Profile: No results for input(s): CHOL, HDL, LDLCALC, TRIG, CHOLHDL, LDLDIRECT in the last 72 hours. Thyroid Function Tests: No results for input(s): TSH, T4TOTAL, FREET4, T3FREE, THYROIDAB in the last 72 hours. Anemia Panel: Recent Labs    06/14/20 0257  FERRITIN 1,544*  TIBC 104*  IRON 24*      Radiology Studies: I have reviewed all of the imaging during this hospital visit personally     Scheduled Meds: . amitriptyline  10 mg Oral QHS  . aspirin EC  81 mg Oral Daily  . atorvastatin  20 mg Oral Daily  . calcium acetate  667 mg Oral TID WC  . Chlorhexidine Gluconate Cloth  6 each Topical Daily  . fluticasone  2 spray Each Nare Daily  . heparin  5,000 Units Subcutaneous Q8H  . insulin aspart  0-9 Units Subcutaneous TID WC  . polyethylene glycol  17 g Oral BID  . sodium zirconium cyclosilicate  10 g Oral TID   Continuous Infusions: . sodium chloride 50 mL/hr at 06/15/20 0611  . meropenem (MERREM) IV 1 g (06/14/20 2232)     LOS: 5 days        Cade Dashner Gerome Apley, MD

## 2020-06-15 NOTE — Progress Notes (Signed)
Pharmacy Antibiotic Note  [name] is a 39 y.o. female admitted on 06/10/2020 with UTI/pyelo.  Pharmacy has been consulted for Merrem dosing.  ID: Pyelonephritis - evolving into sepsis. UCx negative. WBC 15.2 down. Scr 6.72 up. -Hx ESBL Klebsiella UTIs (7/6)  7/28 Meropenem>>  7/29 Urine cultures: neg 7/28 Bx: pending  7/28 Wet Prep genital: abnromal, WBC seen 7/30: UCx: negative  Plan: Merrem 1g IV q 12hrs adjusted for renal function    Height: 5\' 10"  (177.8 cm) Weight: (!) 124.9 kg (275 lb 5.7 oz) IBW/kg (Calculated) : 68.5  Temp (24hrs), Avg:99.2 F (37.3 C), Min:98.2 F (36.8 C), Max:100 F (37.8 C)  Recent Labs  Lab 06/10/20 1009 06/10/20 1627 06/10/20 2220 06/12/20 0153 06/13/20 0802 06/14/20 0257 06/15/20 0433  WBC 16.8*  --   --  16.9* 16.9* 17.6* 15.2*  CREATININE 4.05*  --   --  5.91* 6.10* 5.68* 6.72*  LATICACIDVEN  --  1.1 0.7  --   --   --   --     Estimated Creatinine Clearance: 16.2 mL/min (A) (by C-G formula based on SCr of 6.72 mg/dL (H)).    No Known Allergies  [name] S. Alford Highland, PharmD, BCPS Clinical Staff Pharmacist [email or web address] [place] 06/15/2020 7:19 AM

## 2020-06-16 LAB — BASIC METABOLIC PANEL
Anion gap: 12 (ref 5–15)
BUN: 65 mg/dL — ABNORMAL HIGH (ref 6–20)
CO2: 21 mmol/L — ABNORMAL LOW (ref 22–32)
Calcium: 7.9 mg/dL — ABNORMAL LOW (ref 8.9–10.3)
Chloride: 101 mmol/L (ref 98–111)
Creatinine, Ser: 7.25 mg/dL — ABNORMAL HIGH (ref 0.44–1.00)
GFR calc Af Amer: 7 mL/min — ABNORMAL LOW (ref >60)
GFR calc non Af Amer: 6 mL/min — ABNORMAL LOW (ref >60)
Glucose, Bld: 247 mg/dL — ABNORMAL HIGH (ref 70–99)
Potassium: 4.7 mmol/L (ref 3.5–5.1)
Sodium: 134 mmol/L — ABNORMAL LOW (ref 135–145)

## 2020-06-16 LAB — CBC WITH DIFFERENTIAL/PLATELET
Abs Immature Granulocytes: 0.26 10*3/uL — ABNORMAL HIGH (ref 0.00–0.07)
Basophils Absolute: 0 10*3/uL (ref 0.0–0.1)
Basophils Relative: 0 %
Eosinophils Absolute: 0.1 10*3/uL (ref 0.0–0.5)
Eosinophils Relative: 1 %
HCT: 18.7 % — ABNORMAL LOW (ref 36.0–46.0)
Hemoglobin: 5.9 g/dL — CL (ref 12.0–15.0)
Immature Granulocytes: 2 %
Lymphocytes Relative: 17 %
Lymphs Abs: 2 10*3/uL (ref 0.7–4.0)
MCH: 28.6 pg (ref 26.0–34.0)
MCHC: 31.6 g/dL (ref 30.0–36.0)
MCV: 90.8 fL (ref 80.0–100.0)
Monocytes Absolute: 0.8 10*3/uL (ref 0.1–1.0)
Monocytes Relative: 7 %
Neutro Abs: 8.3 10*3/uL — ABNORMAL HIGH (ref 1.7–7.7)
Neutrophils Relative %: 73 %
Platelets: 339 10*3/uL (ref 150–400)
RBC: 2.06 MIL/uL — ABNORMAL LOW (ref 3.87–5.11)
RDW: 12.5 % (ref 11.5–15.5)
WBC: 11.6 10*3/uL — ABNORMAL HIGH (ref 4.0–10.5)
nRBC: 0.2 % (ref 0.0–0.2)

## 2020-06-16 LAB — URINALYSIS, ROUTINE W REFLEX MICROSCOPIC
Bilirubin Urine: NEGATIVE
Glucose, UA: 150 mg/dL — AB
Ketones, ur: NEGATIVE mg/dL
Nitrite: NEGATIVE
Protein, ur: >=300 mg/dL — AB
RBC / HPF: >50 RBC/hpf — ABNORMAL HIGH (ref 0–5)
Specific Gravity, Urine: 1.018 (ref 1.005–1.030)
WBC, UA: >50 WBC/hpf — ABNORMAL HIGH (ref 0–5)
pH: 5 (ref 5.0–8.0)

## 2020-06-16 LAB — GLUCOSE, CAPILLARY
Glucose-Capillary: 213 mg/dL — ABNORMAL HIGH (ref 70–99)
Glucose-Capillary: 213 mg/dL — ABNORMAL HIGH (ref 70–99)
Glucose-Capillary: 143 mg/dL — ABNORMAL HIGH (ref 70–99)
Glucose-Capillary: 143 mg/dL — ABNORMAL HIGH (ref 70–99)
Glucose-Capillary: 147 mg/dL — ABNORMAL HIGH (ref 70–99)

## 2020-06-16 LAB — GC/CHLAMYDIA PROBE AMP (~~LOC~~) NOT AT ARMC
Chlamydia: NEGATIVE
Comment: NEGATIVE
Comment: NORMAL
Neisseria Gonorrhea: NEGATIVE

## 2020-06-16 LAB — ANTI-DNA ANTIBODY, DOUBLE-STRANDED: ds DNA Ab: 20 IU/mL — ABNORMAL HIGH (ref 0–9)

## 2020-06-16 LAB — ANA: Anti Nuclear Antibody (ANA): POSITIVE — AB

## 2020-06-16 MED ORDER — POLYETHYLENE GLYCOL 3350 17 G PO PACK: 17.0000 g | PACK | Freq: Every day | ORAL | Status: DC | PRN

## 2020-06-16 MED ORDER — METOCLOPRAMIDE HCL 5 MG/ML IJ SOLN
5.0000 mg | Freq: Four times a day (QID) | INTRAMUSCULAR | Status: DC
  Administered 2020-06-16 – 2020-06-26 (×41): 5 mg via INTRAVENOUS
  Filled 2020-06-16 (×40): qty 2

## 2020-06-16 MED ORDER — SODIUM ZIRCONIUM CYCLOSILICATE 10 G PO PACK: 10.0000 g | PACK | Freq: Three times a day (TID) | ORAL | Status: DC

## 2020-06-16 MED ORDER — HEPARIN SODIUM (PORCINE) 5000 UNIT/ML IJ SOLN
5000.0000 [IU] | Freq: Three times a day (TID) | INTRAMUSCULAR | Status: DC
  Administered 2020-06-17 – 2020-06-26 (×27): 5000 [IU] via SUBCUTANEOUS
  Filled 2020-06-16 (×26): qty 1

## 2020-06-16 NOTE — Progress Notes (Signed)
   06/16/20 1454  Clinical Encounter Type  Visited With Patient  Visit Type Spiritual support  Referral From Nurse  Consult/Referral To Chaplain  Spiritual Encounters  Spiritual Needs Prayer;Emotional  Stress Factors  Patient Stress Factors Health changes  This chaplain responded to RN consult for Pt. spiritual care. The Pt. RN-Kathy updated the chaplain before the visit.  The chaplain listened as the Pt. described her fear associated with possibility of dialysis in the near future. The Pt. shared with the chaplain the presence of anxiety as a result of trying to balance dialysis as a head and heart decision. The chaplain's goals of care for the Pt. included recognizing the Pt. personal faith as a strength of peace, the Pt. finding rest through the practice of mediation, and encouraging the Pt. to ask questions about dialysis. The chaplain offered the Pt. F/U spiritual care as needed.

## 2020-06-16 NOTE — Progress Notes (Signed)
Subjective:  SBP over 100-  HR a little low- BUN and crt worse- hgb down to 5.9-  Prt/crt ratio of 4.4 so not extreme-  She is maybe more nauseated-  Threw up last night but also says that she actually feels better Objective Vital signs in last 24 hours: Vitals:   06/15/20 1650 06/15/20 2007 06/16/20 0012 06/16/20 0416  BP: 119/64 119/64 122/75 (!) 102/50  Pulse: 60 60  86  Resp: 17 18 18 20   Temp: 99.2 F (37.3 C) 98.9 F (37.2 C) 98.8 F (37.1 C) 99.3 F (37.4 C)  TempSrc: Tympanic Oral Oral Oral  SpO2: 100% 100% 98% 95%  Weight:      Height:       Weight change:   Intake/Output Summary (Last 24 hours) at 06/16/2020 0813 Last data filed at 06/16/2020 0656 Gross per 24 hour  Intake 1080 ml  Output 200 ml  Net 880 ml    Assessment/Plan: 39 year old BF with DM, obesity, frequent UTIs and strong familly history of ESRD.  She presents with pyelonephritis and A on CRF  1.Renal-  baseline crt appears over 2.  Likely due to having one atrophic kidney and DM.  Longstanding history of proteinuria argues for a chronic issue.  Now with A on CRF in the setting of a UTI-  Possible pyelo and I hope some hemodynamic changes that could be reversible.  There are no absolute indications for dialysis but the trend is not good- not sure I can explain the nausea that predated admission on uremia.  her albumin is 1.3-   she has nephrotic reange proteinuria but not the worst I have seen - maybe also malnourished from recent illness.   This does not fit into a category of an acute GN in may opinion so will not check serologies and would not do renal biopsy with hgb 5.9.  I have told her that there is a good possibility that she will require HD this hospitalization.  Given the fact that she is failing to thrive of sorts I think our hand will be forced to start HD-  Will tee up IR for [facility] tomorrow if numbers or sxms not improved  2. Hypertension/volume  - BP is adequate-  Even low-  Only on clonidine as OP-   Currently on hold-  Getting IVF at 50 per hour for now-  Will stop  3.  Anemia-  hgb under 6-  Refusing transfusion-  Iron sat is low- ferritin is high but will dose with iron-  Also will give ESA-  She gives permission for both  4. [place]-  Phos is OK-  Has been started on phoslo-  Ok to continue -  Will stop lokelma-  k under 5 5. Nausea-  On prn zofran-  Will also start some reglan.  BUN of 60's in young person does not usually cause overwhelming uremic sxms such as these 6.  UTI/pyelo-  On meropenam-  Culture was actually negative- Will recheck U/A-  Seems clinically improved from that standpoint     [place]: Basic Metabolic Panel: Recent Labs  Lab 06/12/20 0153 06/13/20 0802 06/14/20 0257 06/15/20 0433 06/16/20 0405  NA 138   < > 138 137 134*  K 4.3   < > 4.5 4.9 4.7  CL 108   < > 102 102 101  CO2 19*   < > 25 23 21*  GLUCOSE 142*   < > 203* 165* 247*  BUN 42*   < > 49* 57* 65*  CREATININE 5.91*   < > 5.68* 6.72* 7.25*  CALCIUM 7.8*   < > 8.0* 8.2* 7.9*  PHOS 5.5*  --  5.5*  --   --    < > = values in this interval not displayed.   Liver Function Tests: Recent Labs  Lab 06/10/20 1009 06/12/20 0153  AST 14*  --   ALT 16  --   ALKPHOS 86  --   BILITOT 0.5  --   PROT 6.5  --   ALBUMIN 1.6* 1.3*   Recent Labs  Lab 06/10/20 1009  LIPASE 23   No results for input(s): AMMONIA in the last 168 hours. CBC: Recent Labs  Lab 06/12/20 0153 06/12/20 0153 06/13/20 0802 06/13/20 0802 06/14/20 0257 06/15/20 0433 06/16/20 0405  WBC 16.9*   < > 16.9*   < > 17.6* 15.2* 11.6*  NEUTROABS 13.6*   < > 13.9*   < > 14.5* 11.0* 8.3*  HGB 7.2*   < > 7.0*   < > 7.3* 6.5* 5.9*  HCT 22.1*   < > 21.6*   < > 22.6* 20.1* 18.7*  MCV 89.5  --  90.0  --  89.3 91.4 90.8  PLT 272   < > 356   < > 383 362 339   < > = values in this interval not displayed.   Cardiac Enzymes: No results for input(s): CKTOTAL, CKMB, CKMBINDEX, TROPONINI in the last 168  hours. CBG: Recent Labs  Lab 06/15/20 0624 06/15/20 1112 06/15/20 1648 06/15/20 2141 06/16/20 0613  GLUCAP 162* 194* 164* 237* 213*    Iron Studies:  Recent Labs    06/14/20 0257  IRON 24*  TIBC 104*  TRANSFERRIN 74*  FERRITIN 1,544*   Studies/Results: No results found. Medications: Infusions: . sodium chloride 50 mL/hr at 06/16/20 0656  . meropenem (MERREM) IV 1 g (06/15/20 2150)    Scheduled Medications: . amitriptyline  10 mg Oral QHS  . aspirin EC  81 mg Oral Daily  . atorvastatin  20 mg Oral Daily  . calcium acetate  667 mg Oral TID WC  . Chlorhexidine Gluconate Cloth  6 each Topical Daily  . darbepoetin (ARANESP) injection - NON-DIALYSIS  300 mcg Subcutaneous Q Mon-1800  . fluticasone  2 spray Each Nare Daily  . heparin  5,000 Units Subcutaneous Q8H  . insulin aspart  0-9 Units Subcutaneous TID WC  . polyethylene glycol  17 g Oral BID    have reviewed scheduled and prn medications.  Physical Exam: General: in some distress in bed due to nausea Heart: brady Lungs: mostly clear Abdomen: obese, soft, non tender Extremities: min edema    06/16/2020,8:13 AM  LOS: 6 days       ]

## 2020-06-16 NOTE — Progress Notes (Addendum)
PROGRESS NOTE    [name]  FYB:[id number] DOB: 1981/02/25 DOA: 06/10/2020 PCP: Charlott Rakes, MD    Brief Narrative:  Patient admitted to the hospital with a working diagnosis of sepsis due to acute pyelonephritis,end-organ failure hypotensionand AKIpresent on admission.  39 year old female with past medical history for type 2 diabetes mellitus, recurrent urinary tract infections (ESBL Klebsiella), hypertension, CKD stage 3b,history of deep vein thrombosis and obesityclass 2. Patient reported 3 days of lower abdominal pain, associated with nausea and vomiting, no frank dysuria. On July 26 she came to the emergency department but she left without being seen. On her initial physical examination she was febrile 39.1 C, blood pressure 135/69, heart rate 111, respiratory rate 13, oxygen saturation 100%. Her lungs are clear to auscultation bilaterally, heart S1-S2, present rhythmic, her abdomen was soft, tender at the suprapubic region, no guarding or rebound, no lower extremity edema. Sodium 133, potassium 4.3, chloride 99, bicarb 22, glucose 508, BUN 34, creatinine 4.0, lipase 23, AST 14, ALT 16, white count 16.8, hemoglobin 8.5, hematocrit 26.2, platelets 302. SARS COVID-19 negative. Urinalysis >500 glucose, >300 protein, specific gravity 1.017, 21-50 red cells,>50 white cells. CT of the abdomen and pelvis showed right-sided perinephric stranding and mild inflammatory changes around the bladder. Chest radiograph with hilar vascular congestion, right base atelectasis, chronic left base scarring. EKG, 104bpm, left axis deviation, left bundle branch block, sinus rhythm, Q wave V1-V2, poor R wave progression, no significant ST segment changes, lead I/aVL T wave versions.   Patient has been placed on meropenem for antibiotic therapy and aggressive fluid resuscitation for hypotension.  Patient developed oliguric renal failure, likely related to acute ATN due to  hypotension.  Echocardiogram with signs of sepsis induced cardiomyopathy with reduction in LV systolic function and global hypokinesis.  Patient developing signs of acute pulmonary edema, due to volume overload. Serum cr has been between 5 and 6, with urine output more than 500 cc per day. Did not respond to IV furosemide, and now developing hyperKalemia.    Dropping Hgb below 7, declining PRBC transfusion due to [name]  Today continue to decline urine output and has worsening serum creatinine.   Assessment & Plan:   Principal Problem:   Severe sepsis ([place]) Active Problems:   DM (diabetes mellitus), type 2, uncontrolled (HCC)   Obesity (BMI 30-39.9)   Anxiety   Depression   HTN (hypertension)   Migraines   Acute pyelonephritis   AKI (acute kidney injury) ([place])   CKD (chronic kidney disease) stage 3, GFR 30-59 ml/min    1. Sepsis due to acute right pyelonephritis, endorgan failure hypotensionand AKI(present on admission). Suspected multidrug resistant Klebsiella ESBL infection.Current cultures no growth, but urine culture from 07/06 positive for ESBL Klebsiella. Her blood pressure has improved, systolic has remained above 100 mmHg over last 24 H. Wbc trending down to 11.6.    Continuemeropenem #7/8.   Sepsis induced cardiomyopathyEchocardiogram with LV systolic function 40 to 52%, mild decreased systolic function, with global hypokinesis. IVC with more than 50% respiratory variability, suggesting elevated RA pressure.  Oxygenation is 95% on room air today, she reported to me mild dyspnea.  Pain control with fentanyl.  2.AKI on CKD stage 3b(suspectedbaselinehypertensive and diabetic nephropathy)/ hyperkalemia, non anion gap metabolic acidosis/ ATN. Renal US with increased echogenicity both kidneys suggesting chronic renal disease, with right kidney atrophy. Serologies negative: GBM, ANA, PANCA, and complement C3 and C4 is high,   Urine output  over last 24 H is 200 cc. Na  134, K 4,7 , Cl 101, Bicarb 21, BUN 65, and serum Cr is 7,.25.  Continue  phosphate binders. Follow up on renal function in am. Close monitoring of urine output.   Patient will benefit from PRBC transfusion, but continue to decline. If patient will require HD access, acute anemia will be a complicating risk factor. Will discontinue aspirin.   Will follow on Dr. Moshe Cipro (nephrology) recommendations.    3. Uncontrolled T2DM Hgb A1c 7,1/ hypoglucemia. dyslipidemiaThis am fasting glucose267 mg/dl.  Oninsulin sliding scale for glucose cover and monitoring. Continue to hold on long acting insulin for now, to prevent hypoglycemia in the setting of worsening renal function.   Continue with atorvastatin.   4. Obesity class 2.Calculated BMI is 39.At high risk for inpatient complications.  5. Depression.Continue with ariprazole and amitriptyline.  6. HTN.Holding antihypertensive medications, to prevent hypotension.  7. Iron deficiency anemia/ anemia ofchronicrenal disease.Serum iron is 24, transferrin saturation is 23, tibc 104, ferritin 1,544. Transferrin 74. Sp IV iron infusion- darbepoetine 08/02.   Continue to drop Hgb and Hct, patient will benefit from PRBC transfusion, but she continue to decline. I explained that this intervention, will likely help in supporting end-organ function, including renal.   Patient continue to be at high risk for worsening renal function.   Status is: Inpatient  Remains inpatient appropriate because:IV treatments appropriate due to intensity of illness or inability to take PO   Dispo: The patient is from: Home              Anticipated d/c is to: Home              Anticipated d/c date is: > 3 days              Patient currently is not medically stable to d/c.   DVT prophylaxis: Heparin   Code Status:   full  Family Communication:  No family at the bedside.      Consultants:   Nephrology     Antimicrobials:   Meropenem     Subjective: Patient continue to be very weak and deconditioned, she reports dyspnea to me and nausea, no chest pain. Positive bowel movement.  Objective: Vitals:   06/15/20 1650 06/15/20 2007 06/16/20 0012 06/16/20 0416  BP: 119/64 119/64 122/75 (!) 102/50  Pulse: 60 60  86  Resp: 17 18 18 20   Temp: 99.2 F (37.3 C) 98.9 F (37.2 C) 98.8 F (37.1 C) 99.3 F (37.4 C)  TempSrc: Tympanic Oral Oral Oral  SpO2: 100% 100% 98% 95%  Weight:      Height:        Intake/Output Summary (Last 24 hours) at 06/16/2020 0810 Last data filed at 06/16/2020 8242 Gross per 24 hour  Intake 1080 ml  Output 200 ml  Net 880 ml   Filed Weights   06/10/20 0922 06/11/20 0007  Weight: (!) 117.9 kg (!) 124.9 kg    Examination:   General: deconditioned  Neurology: Awake and alert, non focal  E ENT: positive pallor, no icterus, oral mucosa moist Cardiovascular: No JVD (short wide neck). S1-S2 present, rhythmic, no gallops, rubs, or murmurs. Trace non pitting lower extremity edema. Pulmonary: positive breath sounds bilaterally, no wheezing, or rhonchi, mild rales at bases Gastrointestinal. Abdomen protuberant, soft and non tender Skin. No rashes Musculoskeletal: no joint deformities     Data Reviewed: I have personally reviewed following labs and imaging studies  CBC: Recent Labs  Lab 06/12/20 0153 06/13/20 0802 06/14/20 0257 06/15/20 0433 06/16/20  0405  WBC 16.9* 16.9* 17.6* 15.2* 11.6*  NEUTROABS 13.6* 13.9* 14.5* 11.0* 8.3*  HGB 7.2* 7.0* 7.3* 6.5* 5.9*  HCT 22.1* 21.6* 22.6* 20.1* 18.7*  MCV 89.5 90.0 89.3 91.4 90.8  PLT 272 356 383 362 403   Basic Metabolic Panel: Recent Labs  Lab 06/12/20 0153 06/13/20 0802 06/14/20 0257 06/15/20 0433 06/16/20 0405  NA 138 137 138 137 134*  K 4.3 4.5 4.5 4.9 4.7  CL 108 105 102 102 101  CO2 19* 21* 25 23 21*  GLUCOSE 142* 172* 203* 165* 247*  BUN 42* 47* 49* 57* 65*  CREATININE 5.91* 6.10* 5.68*  6.72* 7.25*  CALCIUM 7.8* 7.8* 8.0* 8.2* 7.9*  PHOS 5.5*  --  5.5*  --   --    GFR: Estimated Creatinine Clearance: 15 mL/min (A) (by C-G formula based on SCr of 7.25 mg/dL (H)). Liver Function Tests: Recent Labs  Lab 06/10/20 1009 06/12/20 0153  AST 14*  --   ALT 16  --   ALKPHOS 86  --   BILITOT 0.5  --   PROT 6.5  --   ALBUMIN 1.6* 1.3*   Recent Labs  Lab 06/10/20 1009  LIPASE 23   No results for input(s): AMMONIA in the last 168 hours. Coagulation Profile: Recent Labs  Lab 06/10/20 2220  INR 1.3*   Cardiac Enzymes: No results for input(s): CKTOTAL, CKMB, CKMBINDEX, TROPONINI in the last 168 hours. BNP (last 3 results) No results for input(s): PROBNP in the last 8760 hours. HbA1C: No results for input(s): HGBA1C in the last 72 hours. CBG: Recent Labs  Lab 06/15/20 0624 06/15/20 1112 06/15/20 1648 06/15/20 2141 06/16/20 0613  GLUCAP 162* 194* 164* 237* 213*   Lipid Profile: No results for input(s): CHOL, HDL, LDLCALC, TRIG, CHOLHDL, LDLDIRECT in the last 72 hours. Thyroid Function Tests: No results for input(s): TSH, T4TOTAL, FREET4, T3FREE, THYROIDAB in the last 72 hours. Anemia Panel: Recent Labs    06/14/20 0257  FERRITIN 1,544*  TIBC 104*  IRON 24*      Radiology Studies: I have reviewed all of the imaging during this hospital visit personally     Scheduled Meds: . amitriptyline  10 mg Oral QHS  . aspirin EC  81 mg Oral Daily  . atorvastatin  20 mg Oral Daily  . calcium acetate  667 mg Oral TID WC  . Chlorhexidine Gluconate Cloth  6 each Topical Daily  . darbepoetin (ARANESP) injection - NON-DIALYSIS  300 mcg Subcutaneous Q Mon-1800  . fluticasone  2 spray Each Nare Daily  . heparin  5,000 Units Subcutaneous Q8H  . insulin aspart  0-9 Units Subcutaneous TID WC  . polyethylene glycol  17 g Oral BID   Continuous Infusions: . sodium chloride 50 mL/hr at 06/16/20 0656  . meropenem (MERREM) IV 1 g (06/15/20 2150)     LOS: 6 days         Laketa Sandoz Gerome Apley, MD
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
## 2020-06-17 LAB — CBC WITH DIFFERENTIAL/PLATELET
Abs Immature Granulocytes: 0.23 10*3/uL — ABNORMAL HIGH (ref 0.00–0.07)
Basophils Absolute: 0 10*3/uL (ref 0.0–0.1)
Basophils Relative: 0 %
Eosinophils Absolute: 0.2 10*3/uL (ref 0.0–0.5)
Eosinophils Relative: 1 %
HCT: 17.7 % — ABNORMAL LOW (ref 36.0–46.0)
Hemoglobin: 5.5 g/dL — CL (ref 12.0–15.0)
Immature Granulocytes: 2 %
Lymphocytes Relative: 24 %
Lymphs Abs: 2.5 10*3/uL (ref 0.7–4.0)
MCH: 28.2 pg (ref 26.0–34.0)
MCHC: 31.1 g/dL (ref 30.0–36.0)
MCV: 90.8 fL (ref 80.0–100.0)
Monocytes Absolute: 0.9 10*3/uL (ref 0.1–1.0)
Monocytes Relative: 9 %
Neutro Abs: 6.9 10*3/uL (ref 1.7–7.7)
Neutrophils Relative %: 64 %
Platelets: 358 10*3/uL (ref 150–400)
RBC: 1.95 MIL/uL — ABNORMAL LOW (ref 3.87–5.11)
RDW: 12.8 % (ref 11.5–15.5)
WBC: 10.7 10*3/uL — ABNORMAL HIGH (ref 4.0–10.5)
nRBC: 0.4 % — ABNORMAL HIGH (ref 0.0–0.2)

## 2020-06-17 LAB — RENAL FUNCTION PANEL
Albumin: 1.3 g/dL — ABNORMAL LOW (ref 3.5–5.0)
Anion gap: 17 — ABNORMAL HIGH (ref 5–15)
BUN: 70 mg/dL — ABNORMAL HIGH (ref 6–20)
CO2: 19 mmol/L — ABNORMAL LOW (ref 22–32)
Calcium: 7.6 mg/dL — ABNORMAL LOW (ref 8.9–10.3)
Chloride: 103 mmol/L (ref 98–111)
Creatinine, Ser: 7.77 mg/dL — ABNORMAL HIGH (ref 0.44–1.00)
GFR calc Af Amer: 7 mL/min — ABNORMAL LOW (ref >60)
GFR calc non Af Amer: 6 mL/min — ABNORMAL LOW (ref >60)
Glucose, Bld: 188 mg/dL — ABNORMAL HIGH (ref 70–99)
Phosphorus: 8.1 mg/dL — ABNORMAL HIGH (ref 2.5–4.6)
Potassium: 4.7 mmol/L (ref 3.5–5.1)
Sodium: 139 mmol/L (ref 135–145)

## 2020-06-17 LAB — GLUCOSE, CAPILLARY
Glucose-Capillary: 166 mg/dL — ABNORMAL HIGH (ref 70–99)
Glucose-Capillary: 142 mg/dL — ABNORMAL HIGH (ref 70–99)
Glucose-Capillary: 114 mg/dL — ABNORMAL HIGH (ref 70–99)

## 2020-06-17 MED ORDER — SODIUM BICARBONATE 650 MG PO TABS
650.0000 mg | ORAL_TABLET | Freq: Two times a day (BID) | ORAL | Status: DC
  Administered 2020-06-17 – 2020-06-25 (×18): 650 mg via ORAL
  Filled 2020-06-17 (×18): qty 1

## 2020-06-17 NOTE — Progress Notes (Signed)
PROGRESS NOTE    EULOGIA DISMORE  WUX:[id number] DOB: Nov 18, 1980 DOA: 06/10/2020 PCP: Charlott Rakes, MD    Brief Narrative:  Patient admitted to the hospital with a working diagnosis of severe sepsis due to acute pyelonephritis,end-organ failure hypotensionand AKIpresent on admission. Now with worsening renal function and worsening anemia (Jehova witness).  39 year old female with past medical history for type 2 diabetes mellitus, recurrent urinary tract infections (ESBL Klebsiella), hypertension, CKD stage 3b,history of deep vein thrombosis and obesityclass 2. Patient reported 3 days of lower abdominal pain, associated with nausea and vomiting, no frank dysuria. On July 26 she came to the emergency department but she left without being seen. On her initial physical examination she was febrile 39.1 C, blood pressure 135/69, heart rate 111, respiratory rate 13, oxygen saturation 100%. Her lungs were clear to auscultation bilaterally, heart S1-S2, present rhythmic, her abdomen was soft, tender at the suprapubic region, no guarding or rebound, no lower extremity edema. Sodium 133, potassium 4.3, chloride 99, bicarb 22, glucose 508, BUN 34, creatinine 4.0, lipase 23, AST 14, ALT 16, white count 16.8, hemoglobin 8.5, hematocrit 26.2, platelets 302. SARS COVID-19 negative. Urinalysis >500 glucose, >300 protein, specific gravity 1.017, 21-50 red cells,>50 white cells. CT of the abdomen and pelvis showed right-sided perinephric stranding and mild inflammatory changes around the bladder. Chest radiograph with hilar vascular congestion, right base atelectasis, chronic left base scarring. EKG, 104bpm, left axis deviation, left bundle branch block, sinus rhythm, Q wave V1-V2, poor R wave progression, no significant ST segment changes, lead I/aVL T wave versions.   Patient has been placed on meropenem for antibiotic therapy and aggressive fluid resuscitation for  hypotension.  Patient developed oliguric renal failure, likely related to acute ATN due to hypotension.  Echocardiogram with signs of sepsis induced cardiomyopathy with reduction in LV systolic function and global hypokinesis.  Patient developingearly signs of acutepulmonary edema, due to volume overload. Did not respond to IV furosemide.   Continue to decrease GFR per serum cr, her Hgb also continue to droop (anemia on chronic renal failure). She has been declining PRBC transfusion due to [name].    Plan for renal replacement therapy, likely this hospitalization.    Assessment & Plan:   Principal Problem:   Severe sepsis ([place]) Active Problems:   DM (diabetes mellitus), type 2, uncontrolled (H[facility])   Obesity (BMI 30-39.9)   Anxiety   Depression   HTN (hypertension)   Migraines   Acute pyelonephritis   AKI (acute kidney injury) ([place])   CKD (chronic kidney disease) stage 3, GFR 30-59 ml/min    1. Severe sepsis due to acute right pyelonephritis, endorgan failure hypotensionand AKI(present on admission). Suspected multidrug resistant Klebsiella ESBL infection.Current cultures no growth, buturineculture from 07/06 positive for ESBL Klebsiella. No further hypotension, wbc is down to 10.7. No further back pain.   Today will complete antibiotic therapy with Meropenem #8/8.  Sepsis induced cardiomyopathyEchocardiogram with LV systolic function 40 to 25%, mild decreased systolic function, with global hypokinesis. IVC with more than 50% respiratory variability, suggesting elevated RA pressure.  Continue to be on room air, oxygenation is 92%.   2.AKI on CKD stage 3b(suspectedbaselinehypertensive and diabetic nephropathy)/ hyperkalemia, non anion gap metabolic acidosis/ ATN. Renal US with increased echogenicity both kidneys suggesting chronic renal disease, with right kidney atrophy. Serologies negative: GBM, ANA, PANCA, and complement C3 and C4 is  high,   Urine output over last 24 H is 250 cc.  Na 139, K 4,7 , Cl 103, Bicarb  19, BUN 70, and serum Cr is 7,.77, P 8,1.   Patient with worsening renal function, mild signs of volume overload. Patient will likely need renal replacement therapy on this hospitalization.  Ideally anemia need to be corrected in order to make HD catheter placement and renal replacement more safe. She continues to decline PRBC transfusion, despite knowing that there is a risk of bleeding while placing HD catheter. She understands that worsening anemia can further compromise vital organs and provoke death.   Continue with P binders. Continue with qid metoclopramide for nausea.   3. Uncontrolled T2DM Hgb A1c 7,1/ hypoglucemia. dyslipidemiaThis am fasting glucose188 mg/dl.  She reports improvement in her nausea, will continueinsulin sliding scale for glucose cover and monitoring.  Holding on long acting insulin for now.   Onatorvastatin.   4. Obesity class 2.Calculated BMI is 39.Athigh risk for inpatient complications.  5. Depression. Onariprazole and amitriptyline.  6. HTN.off antihypertensive medications, to prevent hypotension.    7. Iron deficiency anemia/ anemia ofchronicrenal disease.Serum iron is 24, transferrin saturation is 23, tibc 104, ferritin 1,544. Transferrin 74. Sp IV iron infusion- darbepoetine 08/02.   Hgb today down to 5,5 and Hct at 17,7. She continue to decline PRBC transfusion.  Her sister is at the bedside and will talk to her see if can make an exception this time and receive PRBC transfusion.    Patient continue to be at high risk for worsening renal function and anemia.   Status is: Inpatient  Remains inpatient appropriate because:Inpatient level of care appropriate due to severity of illness   Dispo: The patient is from: Home              Anticipated d/c is to: Home              Anticipated d/c date is: > 3 days              Patient currently is not  medically stable to d/c.   DVT prophylaxis: scd   Code Status:   full  Family Communication:  I spoke with patient's sister at the bedside, we talked in detail about patient's condition, plan of care and prognosis and all questions were addressed.      Consultants:   Nephrology     Antimicrobials:   meropenem    Subjective: Patient denies any nausea this am, positive mild dyspnea, no chest pain, no diarrhea.   Objective: Vitals:   06/16/20 1559 06/16/20 1900 06/17/20 0029 06/17/20 0432  BP: 108/67 (!) 114/53 (!) 130/59 (!) 120/53  Pulse: 65 60 62 68  Resp: 19 20 19 20   Temp: 98.3 F (36.8 C) 98.6 F (37 C) 98.3 F (36.8 C) 98.4 F (36.9 C)  TempSrc: Oral Oral Oral Oral  SpO2: 94% 92% 96% 98%  Weight:      Height:        Intake/Output Summary (Last 24 hours) at 06/17/2020 8119 Last data filed at 06/16/2020 1000 Gross per 24 hour  Intake 240 ml  Output 250 ml  Net -10 ml   Filed Weights   06/10/20 0922 06/11/20 0007  Weight: (!) 117.9 kg (!) 124.9 kg    Examination:   General: deconditioned  Neurology: Awake and alert, non focal  E ENT: positive pallor, no icterus, oral mucosa moist Cardiovascular: No JVD. S1-S2 present, rhythmic, no gallops, rubs, or murmurs. + pitting bilateral lower extremity edema. Pulmonary: positive breath sounds bilaterally. No wheezing, mild rales at bases Gastrointestinal. Abdomen soft and non tender Skin.  No rashes Musculoskeletal: no joint deformities     Data Reviewed: I have personally reviewed following labs and imaging studies  CBC: Recent Labs  Lab 06/13/20 0802 06/14/20 0257 06/15/20 0433 06/16/20 0405 06/17/20 0338  WBC 16.9* 17.6* 15.2* 11.6* 10.7*  NEUTROABS 13.9* 14.5* 11.0* 8.3* 6.9  HGB 7.0* 7.3* 6.5* 5.9* 5.5*  HCT 21.6* 22.6* 20.1* 18.7* 17.7*  MCV 90.0 89.3 91.4 90.8 90.8  PLT 356 383 362 339 017   Basic Metabolic Panel: Recent Labs  Lab 06/12/20 0153 06/12/20 0153 06/13/20 0802  06/14/20 0257 06/15/20 0433 06/16/20 0405 06/17/20 0338  NA 138   < > 137 138 137 134* 139  K 4.3   < > 4.5 4.5 4.9 4.7 4.7  CL 108   < > 105 102 102 101 103  CO2 19*   < > 21* 25 23 21* 19*  GLUCOSE 142*   < > 172* 203* 165* 247* 188*  BUN 42*   < > 47* 49* 57* 65* 70*  CREATININE 5.91*   < > 6.10* 5.68* 6.72* 7.25* 7.77*  CALCIUM 7.8*   < > 7.8* 8.0* 8.2* 7.9* 7.6*  PHOS 5.5*  --   --  5.5*  --   --  8.1*   < > = values in this interval not displayed.   GFR: Estimated Creatinine Clearance: 14 mL/min (A) (by C-G formula based on SCr of 7.77 mg/dL (H)). Liver Function Tests: Recent Labs  Lab 06/10/20 1009 06/12/20 0153 06/17/20 0338  AST 14*  --   --   ALT 16  --   --   ALKPHOS 86  --   --   BILITOT 0.5  --   --   PROT 6.5  --   --   ALBUMIN 1.6* 1.3* 1.3*   Recent Labs  Lab 06/10/20 1009  LIPASE 23   No results for input(s): AMMONIA in the last 168 hours. Coagulation Profile: Recent Labs  Lab 06/10/20 2220  INR 1.3*   Cardiac Enzymes: No results for input(s): CKTOTAL, CKMB, CKMBINDEX, TROPONINI in the last 168 hours. BNP (last 3 results) No results for input(s): PROBNP in the last 8760 hours. HbA1C: No results for input(s): HGBA1C in the last 72 hours. CBG: Recent Labs  Lab 06/16/20 0613 06/16/20 1119 06/16/20 1557 06/16/20 2201 06/17/20 0615  GLUCAP 213*  213* 143* 143* 147* 166*   Lipid Profile: No results for input(s): CHOL, HDL, LDLCALC, TRIG, CHOLHDL, LDLDIRECT in the last 72 hours. Thyroid Function Tests: No results for input(s): TSH, T4TOTAL, FREET4, T3FREE, THYROIDAB in the last 72 hours. Anemia Panel: No results for input(s): VITAMINB12, FOLATE, FERRITIN, TIBC, IRON, RETICCTPCT in the last 72 hours.    Radiology Studies: I have reviewed all of the imaging during this hospital visit personally     Scheduled Meds: . amitriptyline  10 mg Oral QHS  . atorvastatin  20 mg Oral Daily  . calcium acetate  667 mg Oral TID WC  .  Chlorhexidine Gluconate Cloth  6 each Topical Daily  . darbepoetin (ARANESP) injection - NON-DIALYSIS  300 mcg Subcutaneous Q Mon-1800  . fluticasone  2 spray Each Nare Daily  . heparin  5,000 Units Subcutaneous Q8H  . insulin aspart  0-9 Units Subcutaneous TID WC  . metoCLOPramide (REGLAN) injection  5 mg Intravenous Q6H   Continuous Infusions: . meropenem (MERREM) IV 1 g (06/16/20 2217)     LOS: 7 days        Travonte Byard Gerome Apley,  MD   

## 2020-06-17 NOTE — Progress Notes (Signed)
Subjective:  SBP good-  HR- BUN and crt slightly worse but nausea better on reglan- hgb down to 5.5-  Sister is here to try and talk her into a transfusion-  She is very helpful    Objective Vital signs in last 24 hours: Vitals:   06/16/20 1559 06/16/20 1900 06/17/20 0029 06/17/20 0432  BP: 108/67 (!) 114/53 (!) 130/59 (!) 120/53  Pulse: 65 60 62 68  Resp: 19 20 19 20   Temp: 98.3 F (36.8 C) 98.6 F (37 C) 98.3 F (36.8 C) 98.4 F (36.9 C)  TempSrc: Oral Oral Oral Oral  SpO2: 94% 92% 96% 98%  Weight:      Height:       Weight change:   Intake/Output Summary (Last 24 hours) at 06/17/2020 9371 Last data filed at 06/16/2020 1000 Gross per 24 hour  Intake 240 ml  Output --  Net 240 ml    Assessment/Plan: 39 year old BF with DM, obesity, frequent UTIs and strong familly history of ESRD.  She presents with pyelonephritis and A on CRF  1.Renal-  baseline crt appears over 2.  Likely due to having one atrophic kidney and DM.  Longstanding history of proteinuria argues for a chronic issue.  Now with A on CRF in the setting of a UTI-  Possible pyelo and I hope some hemodynamic changes that could be reversible.  There are no absolute indications for dialysis but the trend is not good-  Had nausea that predated admission and  her albumin is 1.3-   she has nephrotic reange proteinuria but not the worst I have seen - so thought to be malnourished from recent illness.   This does not fit into a category of an acute GN in may opinion so will not check serologies and would not do renal biopsy with a low hgb.  I have told her that there is a good possibility that she will require HD this hospitalization.  Given the fact that she was failing to thrive of sorts I had planned to start HD.  However, now she is symptomatically improved-  I have a hard time doing an invasive procedure ( HD cath)  For kidney numbers that are not severe that could throw her into crisis from anemia.  Her sister is going to keep  trying to talk her into the transfusion-  Will hold on HD cath and initiating HD for now-  Re assess day by day  2. Hypertension/volume  - BP is adequate-  Even low-  Only on clonidine as OP-  Currently on hold-   3.  Anemia-  hgb under 6-  Refusing transfusion-  Iron sat is low- ferritin is high but have dosed with iron-  Also will give ESA-  She gives permission for both  4. Elytes-  Phos is OK-  Has been started on phoslo-  Ok to continue -  Will stop lokelma-  k under 5 5. Nausea-  On prn zofran-  Will also start some reglan which has improved the situation so less convinced that she is actually uremic-  So holding off on starting HD  6.  UTI/pyelo-  On meropenam-  Culture was actually negative- recheck of U/A still appears to show UTI with yeast-  May want to start diflucan ?      Louis Meckel    Labs: Basic Metabolic Panel: Recent [employer] 06/12/20 0153 06/13/20 6967 06/14/20 8938 06/14/20 1017 06/15/20 5102 06/16/20 0405 06/17/20 5852  NA  138   < > 138   < > 137 134* 139  K 4.3   < > 4.5   < > 4.9 4.7 4.7  CL 108   < > 102   < > 102 101 103  CO2 19*   < > 25   < > 23 21* 19*  GLUCOSE 142*   < > 203*   < > 165* 247* 188*  BUN 42*   < > 49*   < > 57* 65* 70*  CREATININE 5.91*   < > 5.68*   < > 6.72* 7.25* 7.77*  CALCIUM 7.8*   < > 8.0*   < > 8.2* 7.9* 7.6*  PHOS 5.5*  --  5.5*  --   --   --  8.1*   < > = values in this interval not displayed.   Liver Function Tests: Recent Labs  Lab 06/10/20 1009 06/12/20 0153 06/17/20 0338  AST 14*  --   --   ALT 16  --   --   ALKPHOS 86  --   --   BILITOT 0.5  --   --   PROT 6.5  --   --   ALBUMIN 1.6* 1.3* 1.3*   Recent Labs  Lab 06/10/20 1009  LIPASE 23   No results for input(s): AMMONIA in the last 168 hours. CBC: Recent Labs  Lab 06/13/20 0802 06/13/20 0802 06/14/20 0257 06/14/20 0257 06/15/20 0433 06/16/20 0405 06/17/20 0338  WBC 16.9*   < > 17.6*   < > 15.2* 11.6* 10.7*  NEUTROABS 13.9*   < >  14.5*   < > 11.0* 8.3* 6.9  HGB 7.0*   < > 7.3*   < > 6.5* 5.9* 5.5*  HCT 21.6*   < > 22.6*   < > 20.1* 18.7* 17.7*  MCV 90.0  --  89.3  --  91.4 90.8 90.8  PLT 356   < > 383   < > 362 339 358   < > = values in this interval not displayed.   Cardiac Enzymes: No results for input(s): CKTOTAL, CKMB, CKMBINDEX, TROPONINI in the last 168 hours. CBG: Recent Labs  Lab 06/16/20 0613 06/16/20 1119 06/16/20 1557 06/16/20 2201 06/17/20 0615  GLUCAP 213*  213* 143* 143* 147* 166*    Iron Studies:  No results for input(s): IRON, TIBC, TRANSFERRIN, FERRITIN in the last 72 hours. Studies/Results: No results found. Medications: Infusions: . meropenem (MERREM) IV 1 g (06/16/20 2217)    Scheduled Medications: . amitriptyline  10 mg Oral QHS  . atorvastatin  20 mg Oral Daily  . calcium acetate  667 mg Oral TID WC  . Chlorhexidine Gluconate Cloth  6 each Topical Daily  . darbepoetin (ARANESP) injection - NON-DIALYSIS  300 mcg Subcutaneous Q Mon-1800  . fluticasone  2 spray Each Nare Daily  . heparin  5,000 Units Subcutaneous Q8H  . insulin aspart  0-9 Units Subcutaneous TID WC  . metoCLOPramide (REGLAN) injection  5 mg Intravenous Q6H    have reviewed scheduled and prn medications.  Physical Exam: General: in some distress in bed due to nausea- better  Heart: brady Lungs: mostly clear Abdomen: obese, soft, non tender Extremities: min edema    06/17/2020,8:42 AM  LOS: 7 days       ]

## 2020-06-18 LAB — RENAL FUNCTION PANEL
Albumin: 1.5 g/dL — ABNORMAL LOW (ref 3.5–5.0)
Anion gap: 15 (ref 5–15)
BUN: 77 mg/dL — ABNORMAL HIGH (ref 6–20)
CO2: 19 mmol/L — ABNORMAL LOW (ref 22–32)
Calcium: 7.8 mg/dL — ABNORMAL LOW (ref 8.9–10.3)
Chloride: 105 mmol/L (ref 98–111)
Creatinine, Ser: 7.59 mg/dL — ABNORMAL HIGH (ref 0.44–1.00)
GFR calc Af Amer: 7 mL/min — ABNORMAL LOW (ref >60)
GFR calc non Af Amer: 6 mL/min — ABNORMAL LOW (ref >60)
Glucose, Bld: 166 mg/dL — ABNORMAL HIGH (ref 70–99)
Phosphorus: 8.2 mg/dL — ABNORMAL HIGH (ref 2.5–4.6)
Potassium: 4.5 mmol/L (ref 3.5–5.1)
Sodium: 139 mmol/L (ref 135–145)

## 2020-06-18 LAB — CBC WITH DIFFERENTIAL/PLATELET
Abs Immature Granulocytes: 0.19 10*3/uL — ABNORMAL HIGH (ref 0.00–0.07)
Basophils Absolute: 0 10*3/uL (ref 0.0–0.1)
Basophils Relative: 0 %
Eosinophils Absolute: 0.2 10*3/uL (ref 0.0–0.5)
Eosinophils Relative: 1 %
HCT: 16.6 % — ABNORMAL LOW (ref 36.0–46.0)
Hemoglobin: 5.2 g/dL — CL (ref 12.0–15.0)
Immature Granulocytes: 2 %
Lymphocytes Relative: 23 %
Lymphs Abs: 2.7 10*3/uL (ref 0.7–4.0)
MCH: 28.7 pg (ref 26.0–34.0)
MCHC: 31.3 g/dL (ref 30.0–36.0)
MCV: 91.7 fL (ref 80.0–100.0)
Monocytes Absolute: 0.8 10*3/uL (ref 0.1–1.0)
Monocytes Relative: 7 %
Neutro Abs: 8 10*3/uL — ABNORMAL HIGH (ref 1.7–7.7)
Neutrophils Relative %: 67 %
Platelets: 410 10*3/uL — ABNORMAL HIGH (ref 150–400)
RBC: 1.81 MIL/uL — ABNORMAL LOW (ref 3.87–5.11)
RDW: 12.9 % (ref 11.5–15.5)
WBC: 11.9 10*3/uL — ABNORMAL HIGH (ref 4.0–10.5)
nRBC: 1.2 % — ABNORMAL HIGH (ref 0.0–0.2)

## 2020-06-18 LAB — GLUCOSE, CAPILLARY
Glucose-Capillary: 162 mg/dL — ABNORMAL HIGH (ref 70–99)
Glucose-Capillary: 189 mg/dL — ABNORMAL HIGH (ref 70–99)
Glucose-Capillary: 243 mg/dL — ABNORMAL HIGH (ref 70–99)

## 2020-06-18 MED ORDER — ADULT MULTIVITAMIN W/MINERALS CH
1.0000 | ORAL_TABLET | Freq: Every day | ORAL | Status: DC
  Administered 2020-06-19 – 2020-06-26 (×8): 1 via ORAL
  Filled 2020-06-18 (×8): qty 1

## 2020-06-18 MED ORDER — FLUCONAZOLE 100 MG PO TABS
100.0000 mg | ORAL_TABLET | Freq: Every day | ORAL | Status: AC
  Administered 2020-06-18 – 2020-06-22 (×5): 100 mg via ORAL
  Filled 2020-06-18 (×5): qty 1

## 2020-06-18 MED ORDER — PROSOURCE PLUS PO LIQD
30.0000 mL | Freq: Two times a day (BID) | ORAL | Status: DC
  Administered 2020-06-20 – 2020-06-26 (×11): 30 mL via ORAL
  Filled 2020-06-18 (×16): qty 30

## 2020-06-18 MED ORDER — SODIUM CHLORIDE 0.9 % IV SOLN
510.0000 mg | Freq: Once | INTRAVENOUS | Status: AC
  Administered 2020-06-18: 510 mg via INTRAVENOUS
  Filled 2020-06-18: qty 17

## 2020-06-18 NOTE — Progress Notes (Signed)
Subjective:  SBP  And UOP good-  HR- crt actually a little better-  BUN still up-  Sitting up in bed-  Good spirits-  Does not seem overwhelmingly uremic or weak    Objective Vital signs in last 24 hours: Vitals:   06/17/20 1507 06/18/20 0043 06/18/20 0613 06/18/20 0814  BP: 107/60 124/65 139/67 124/68  Pulse: (!) 55 (!) 55 (!) 55 (!) 55  Resp: 18 18 18 17   Temp: 99 F (37.2 C) 98.6 F (37 C) 98.9 F (37.2 C) 98.8 F (37.1 C)  TempSrc: Oral Oral Oral Oral  SpO2: 100% 100% 100% 100%  Weight:      Height:       Weight change:   Intake/Output Summary (Last 24 hours) at 06/18/2020 0857 Last data filed at 06/18/2020 0851 Gross per 24 hour  Intake 240 ml  Output 1050 ml  Net -810 ml    Assessment/Plan: 39 year old BF with DM, obesity, frequent UTIs and strong familly history of ESRD.  She presents with pyelonephritis and A on CRF  1.Renal-  baseline crt appears over 2.  Likely due to having one atrophic kidney and DM.  Longstanding history of proteinuria argues for a chronic issue.  Now with A on CRF in the setting of a UTI-  Possible pyelo and some hemodynamic changes that could be reversible.  There have been no absolute indications for dialysis but the trend was not good-  Had nausea that predated admission and  her albumin is 1.3-   she has nephrotic reange proteinuria but not the worst I have seen - so thought to be more c/w  malnourishment from recent illness.   I   Given the fact that she was failing to thrive of sorts I had planned to start HD.  However, now she is symptomatically improved-  I have a hard time doing an invasive procedure ( HD cath)  For kidney numbers that are not severe that could throw her into crisis from anemia.  Her sister is going to keep trying to talk her into the transfusion-  Will continue to hold on HD cath and initiating HD for now-  Re assess day by day-  crt actually down-  May be trying to turn the corner-  Will order labs next on Sat to limit blood  draws  2. Hypertension/volume  - BP is adequate-  Even low-  Only on clonidine as OP-  Currently on hold-   3.  Anemia-  hgb under 6-  Refusing transfusion-  Iron sat is low- ferritin is high but have dosed with iron on 8/2 - will redose today-  Also giving ESA-  She gives permission for both  4. Elytes-  Phos is high-  Has been started on phoslo-  Ok to continue -  stopped lokelma-  k under 5 5. Nausea-  On prn zofran and reglan which has improved the situation so less convinced that she is actually uremic-  So holding off on starting HD  6.  UTI/pyelo-  On meropenam-  Culture was actually negative- recheck of U/A still appears to show pyuria, now with yeast-  I will d/c foley and start [place]: Basic Metabolic Panel: Recent Labs  Lab 06/14/20 0257 06/15/20 0433 06/16/20 0405 06/17/20 0338 06/18/20 0114  NA 138   < > 134* 139 139  K 4.5   < > 4.7 4.7 4.5  CL 102   < >  101 103 105  CO2 25   < > 21* 19* 19*  GLUCOSE 203*   < > 247* 188* 166*  BUN 49*   < > 65* 70* 77*  CREATININE 5.68*   < > 7.25* 7.77* 7.59*  CALCIUM 8.0*   < > 7.9* 7.6* 7.8*  PHOS 5.5*  --   --  8.1* 8.2*   < > = values in this interval not displayed.   Liver Function Tests: Recent Labs  Lab 06/12/20 0153 06/17/20 0338 06/18/20 0114  ALBUMIN 1.3* 1.3* 1.5*   No results for input(s): LIPASE, AMYLASE in the last 168 hours. No results for input(s): AMMONIA in the last 168 hours. CBC: Recent Labs  Lab 06/13/20 0802 06/13/20 0802 06/14/20 0257 06/14/20 0257 06/15/20 0433 06/16/20 0405 06/17/20 0338  WBC 16.9*   < > 17.6*   < > 15.2* 11.6* 10.7*  NEUTROABS 13.9*   < > 14.5*   < > 11.0* 8.3* 6.9  HGB 7.0*   < > 7.3*   < > 6.5* 5.9* 5.5*  HCT 21.6*   < > 22.6*   < > 20.1* 18.7* 17.7*  MCV 90.0  --  89.3  --  91.4 90.8 90.8  PLT 356   < > 383   < > 362 339 358   < > = values in this interval not displayed.   Cardiac Enzymes: No results for input(s): CKTOTAL,  CKMB, CKMBINDEX, TROPONINI in the last 168 hours. CBG: Recent Labs  Lab 06/16/20 2201 06/17/20 0615 06/17/20 1121 06/17/20 1709 06/18/20 0611  GLUCAP 147* 166* 142* 114* 162*    Iron Studies:  No results for input(s): IRON, TIBC, TRANSFERRIN, FERRITIN in the last 72 hours. Studies/Results: No results found. Medications: Infusions: . meropenem (MERREM) IV 1 g (06/18/20 0201)    Scheduled Medications: . amitriptyline  10 mg Oral QHS  . atorvastatin  20 mg Oral Daily  . calcium acetate  667 mg Oral TID WC  . Chlorhexidine Gluconate Cloth  6 each Topical Daily  . darbepoetin (ARANESP) injection - NON-DIALYSIS  300 mcg Subcutaneous Q Mon-1800  . fluticasone  2 spray Each Nare Daily  . heparin  5,000 Units Subcutaneous Q8H  . insulin aspart  0-9 Units Subcutaneous TID WC  . metoCLOPramide (REGLAN) injection  5 mg Intravenous Q6H  . sodium bicarbonate  650 mg Oral BID    have reviewed scheduled and prn medications.  Physical Exam: General: actually looking better  Heart: brady Lungs: mostly clear Abdomen: obese, soft, non tender Extremities: min edema    06/18/2020,8:57 AM  LOS: 8 days       ]

## 2020-06-18 NOTE — Progress Notes (Signed)
PROGRESS NOTE    KASYN STOUFFER  JJO:[id number] DOB: [birth year] DOA: 06/10/2020 PCP: Charlott Rakes, MD    Brief Narrative:  Patient admitted to the hospital with a working diagnosis of severe sepsis due to acute pyelonephritis,end-organ failure hypotensionand AKIpresent on admission. Now with worsening renal function and worsening anemia (Jehova witness).  39 year old female with past medical history for type 2 diabetes mellitus, recurrent urinary tract infections (ESBL Klebsiella), hypertension, CKD stage 3b,history of deep vein thrombosis and obesityclass 2. Patient reported 3 days of lower abdominal pain, associated with nausea and vomiting, no frank dysuria. On July 26 she came to the emergency department but she left without being seen. On her initial physical examination she was febrile 39.1 C, blood pressure 135/69, heart rate 111, respiratory rate 13, oxygen saturation 100%. Her lungs were clear to auscultation bilaterally, heart S1-S2, present rhythmic, her abdomen was soft, tender at the suprapubic region, no guarding or rebound, no lower extremity edema. Sodium 133, potassium 4.3, chloride 99, bicarb 22, glucose 508, BUN 34, creatinine 4.0, lipase 23, AST 14, ALT 16, white count 16.8, hemoglobin 8.5, hematocrit 26.2, platelets 302. SARS COVID-19 negative. Urinalysis >500 glucose, >300 protein, specific gravity 1.017, 21-50 red cells,>50 white cells. CT of the abdomen and pelvis showed right-sided perinephric stranding and mild inflammatory changes around the bladder. Chest radiograph with hilar vascular congestion, right base atelectasis, chronic left base scarring. EKG, 104bpm, left axis deviation, left bundle branch block, sinus rhythm, Q wave V1-V2, poor R wave progression, no significant ST segment changes, lead I/aVL T wave versions.   Patient has been placed on meropenem for antibiotic therapy and aggressive fluid resuscitation for  hypotension.  Patient developed oliguric renal failure, likely related to acute ATN due to hypotension.  Echocardiogram with signs of sepsis induced cardiomyopathy with reduction in LV systolic function and global hypokinesis.  Patient developingearly signs of acutepulmonary edema, due to volume overload. Did not respond to IV furosemide.   Continue to decrease GFR per serum cr, her Hgb also continue to droop (anemia on chronic renal failure). She has been declining PRBC transfusion due to [name].    Plan for renal replacement therapy, likely this hospitalization.   Patient with worsening renal function, mild signs of volume overload. Patient will likely need renal replacement therapy on this hospitalization.  Ideally anemia need to be corrected in order to make HD catheter placement and renal replacement more safe. She continues to decline PRBC transfusion, despite knowing that there is a risk of bleeding while placing HD catheter. She understands that worsening anemia can further compromise vital organs and provoke death.   Assessment & Plan:   Principal Problem:   Severe sepsis ([place]) Active Problems:   DM (diabetes mellitus), type 2, uncontrolled (H[facility])   Obesity (BMI 30-39.9)   Anxiety   Depression   HTN (hypertension)   Migraines   Acute pyelonephritis   AKI (acute kidney injury) ([place])   CKD (chronic kidney disease) stage 3, GFR 30-59 ml/min   1. Severe sepsis due to acute right pyelonephritis, endorgan failure hypotensionand AKI(present on admission). Suspected multidrug resistant Klebsiella ESBL infection.Current cultures no growth, buturineculture from 07/06 positive for ESBL Klebsiella.  Completed antibiotic therapy with meropenem. Check cell count today.   Sepsis induced cardiomyopathyEchocardiogram with LV systolic function 40 to 16%, mild decreased systolic function, with global hypokinesis. IVC with more than 50% respiratory variability,  suggesting elevated RA pressure.  Trace pitting bilateral lower extremity edema.   2.AKI on CKD stage 3b(suspectedbaselinehypertensive  and diabetic nephropathy)/hyperkalemia,non anion gap metabolic acidosis/ ATN. Renal US with increased echogenicity both kidneys suggesting chronic renal disease, with right kidney atrophy. Serologies negative: GBM, ANA, PANCA, and complement C3 and C4 is high,   Urine output over last 24 H is 750 cc. Na 139, K 4,5 , Cl 105, Bicarb 19, BUN 77, and serum Cr is 7,.59, P 8,2.  Continue phoslo and oral serum bicarboante.   Continue to follow with nephrology recommendations in regards of timing for renal replacement therapy.  Metoclopramide for nausea control.   3. Uncontrolled T2DM Hgb A1c 7,1/ hypoglucemia. dyslipidemiaThis am fasting glucose166 mg/dl.  Continue insulin sliding scale for glucose cover and monitoring. Continue to hold on basal insulin therapy.   Continue withatorvastatin.   4. Obesity class 2.Calculated BMI is 39.Athigh risk for inpatient complications.  5. Depression. Continue withariprazole and amitriptyline.  6. HTN.blood pressure 120/63, continue to hold on antihypertensive medications, for now.   7. Iron deficiency anemia/ anemia ofchronicrenal disease.Serum iron is 24, transferrin saturation is 23, tibc 104, ferritin 1,544. Transferrin 74.Sp IV iron infusion- darbepoetine 08/02.  Follow cell count today.   Patient continue to be at high risk for worsening renal failure.   Status is: Inpatient  Remains inpatient appropriate because:Inpatient level of care appropriate due to severity of illness   Dispo: The patient is from: Home              Anticipated d/c is to: Home              Anticipated d/c date is: > 3 days              Patient currently is not medically stable to d/c.  DVT prophylaxis: Heparin   Code Status:   full  Family Communication:  No family at the bedside      Consultants:   Nephrology      Antimicrobials:   Completed meropenem.     Subjective: Patient today has dyspnea but no significant nausea or vomiting, no chest pain.   Objective: Vitals:   06/17/20 1507 06/18/20 0043 06/18/20 0613 06/18/20 0814  BP: 107/60 124/65 139/67 124/68  Pulse: (!) 55 (!) 55 (!) 55 (!) 55  Resp: 18 18 18 17   Temp: 99 F (37.2 C) 98.6 F (37 C) 98.9 F (37.2 C) 98.8 F (37.1 C)  TempSrc: Oral Oral Oral Oral  SpO2: 100% 100% 100% 100%  Weight:      Height:        Intake/Output Summary (Last 24 hours) at 06/18/2020 0957 Last data filed at 06/18/2020 8841 Gross per 24 hour  Intake 240 ml  Output 600 ml  Net -360 ml   Filed Weights   06/10/20 0922 06/11/20 0007  Weight: (!) 117.9 kg (!) 124.9 kg    Examination:   General: deconditioned  Neurology: Awake and alert, non focal  E ENT: positive pallor, no icterus, oral mucosa moist Cardiovascular: No JVD. S1-S2 present, rhythmic, no gallops, rubs, or murmurs. +/++ trace bilateral lower extremity edema. Pulmonary: positive breath sounds bilaterally, Gastrointestinal. Abdomen soft and non tender Skin. No rashes Musculoskeletal: no joint deformities     Data Reviewed: I have personally reviewed following labs and imaging studies  CBC: Recent Labs  Lab 06/13/20 0802 06/14/20 0257 06/15/20 0433 06/16/20 0405 06/17/20 0338  WBC 16.9* 17.6* 15.2* 11.6* 10.7*  NEUTROABS 13.9* 14.5* 11.0* 8.3* 6.9  HGB 7.0* 7.3* 6.5* 5.9* 5.5*  HCT 21.6* 22.6* 20.1* 18.7* 17.7*  MCV 90.0 89.3  91.4 90.8 90.8  PLT 356 383 362 339 570   Basic Metabolic Panel: Recent Labs  Lab 06/12/20 0153 06/13/20 0802 06/14/20 0257 06/15/20 0433 06/16/20 0405 06/17/20 0338 06/18/20 0114  NA 138   < > 138 137 134* 139 139  K 4.3   < > 4.5 4.9 4.7 4.7 4.5  CL 108   < > 102 102 101 103 105  CO2 19*   < > 25 23 21* 19* 19*  GLUCOSE 142*   < > 203* 165* 247* 188* 166*  BUN 42*   < > 49* 57* 65* 70* 77*   CREATININE 5.91*   < > 5.68* 6.72* 7.25* 7.77* 7.59*  CALCIUM 7.8*   < > 8.0* 8.2* 7.9* 7.6* 7.8*  PHOS 5.5*  --  5.5*  --   --  8.1* 8.2*   < > = values in this interval not displayed.   GFR: Estimated Creatinine Clearance: 14.3 mL/min (A) (by C-G formula based on SCr of 7.59 mg/dL (H)). Liver Function Tests: Recent Labs  Lab 06/12/20 0153 06/17/20 0338 06/18/20 0114  ALBUMIN 1.3* 1.3* 1.5*   No results for input(s): LIPASE, AMYLASE in the last 168 hours. No results for input(s): AMMONIA in the last 168 hours. Coagulation Profile: No results for input(s): INR, PROTIME in the last 168 hours. Cardiac Enzymes: No results for input(s): CKTOTAL, CKMB, CKMBINDEX, TROPONINI in the last 168 hours. BNP (last 3 results) No results for input(s): PROBNP in the last 8760 hours. HbA1C: No results for input(s): HGBA1C in the last 72 hours. CBG: Recent Labs  Lab 06/16/20 2201 06/17/20 0615 06/17/20 1121 06/17/20 1709 06/18/20 0611  GLUCAP 147* 166* 142* 114* 162*   Lipid Profile: No results for input(s): CHOL, HDL, LDLCALC, TRIG, CHOLHDL, LDLDIRECT in the last 72 hours. Thyroid Function Tests: No results for input(s): TSH, T4TOTAL, FREET4, T3FREE, THYROIDAB in the last 72 hours. Anemia Panel: No results for input(s): VITAMINB12, FOLATE, FERRITIN, TIBC, IRON, RETICCTPCT in the last 72 hours.    Radiology Studies: I have reviewed all of the imaging during this hospital visit personally     Scheduled Meds: . amitriptyline  10 mg Oral QHS  . atorvastatin  20 mg Oral Daily  . calcium acetate  667 mg Oral TID WC  . Chlorhexidine Gluconate Cloth  6 each Topical Daily  . darbepoetin (ARANESP) injection - NON-DIALYSIS  300 mcg Subcutaneous Q Mon-1800  . fluconazole  100 mg Oral Daily  . fluticasone  2 spray Each Nare Daily  . heparin  5,000 Units Subcutaneous Q8H  . insulin aspart  0-9 Units Subcutaneous TID WC  . metoCLOPramide (REGLAN) injection  5 mg Intravenous Q6H  .  sodium bicarbonate  650 mg Oral BID   Continuous Infusions: . ferumoxytol    . meropenem (MERREM) IV 1 g (06/18/20 0936)     LOS: 8 days        Samie Reasons Gerome Apley, MD

## 2020-06-18 NOTE — Progress Notes (Signed)
Met w/ sister - [name]; other sister [name] will be allowed to be the 2 designated visitors. Previously the brother was listed but per Ann, this environment is not suitable for him to enjoy visiting w/ his sister. NO further changes to visition designated persons can occur. Policy reviewed due to covid.  --JM  °

## 2020-06-18 NOTE — Progress Notes (Signed)
   06/18/20 0900  Clinical Encounter Type  Visited With Patient;Health care provider  Visit Type Spiritual support  Referral From Nurse  Consult/Referral To [place] (Comment) (Connected with clergy from faith tradition.)  Stress Factors  Patient Stress Factors Health changes   Chaplain responded to consult for prayer and support. Nary was with her RN as Clinical biochemist entered the room. Chaplain inqired about Conya's anxiety level and her reply was, "It's like 1,000" "I am also angry and irritated." "I am not afraid of dying or living." "This all happened so fast." Chaplain asked if it would be helpful if Narya could speak with a clergy who specializes in the [place] tradition. Tamaka said this would be a great relief. Chaplain asked Charge RN on shift today for clearance to have [place] visit. Clearance was granted. Chaplain made contact with Rev. Ellwood Dense, [phone], and Jenny Reichmann will contact pt via room phone to plan a time for visit. Chaplain instructed Rev. John to check in at the [place] on 2N and also check in at the desk on 4E. Chaplain does not know what time the visit will take place. Rev Jenny Reichmann is aware that pt has questions around faith vs medical care. Chaplains remain available for support as needs arise.   Chaplain Resident, Evelene Croon, MDiv [phone] on-call pager

## 2020-06-18 NOTE — Progress Notes (Signed)
Removed urethral catheter per order. Patient had what appeared to be stool on balloon area of catheter, tubing was clean otherwise. Urine malodorous and cloudy. Patient tolerated well and resting with call bell within reach. Payton Emerald, RN

## 2020-06-18 NOTE — Progress Notes (Signed)
Initial Nutrition Assessment  DOCUMENTATION CODES:   Not applicable  INTERVENTION:   Obtain daily weights    30 ml ProSource Plus BID, each supplement provides 100 kcals and 15 grams protein.   MVI daily   NUTRITION DIAGNOSIS:   Increased nutrient needs related to acute illness as evidenced by estimated needs.  GOAL:   Patient will meet greater than or equal to 90% of their needs  MONITOR:   PO intake, Supplement acceptance, Weight trends, I & O's, Labs  REASON FOR ASSESSMENT:   LOS    ASSESSMENT:   Patient with PMH significant for DM, CKD III, DVT, and recurrent UTIs. Presents this admission with R pyelonephritis and AKI.   Holding off on renal replacement therapy as Cr slightly improved.   Previously pt denied loss in appetite PTA. Typically consumes three meals daily. Having issues with nausea this admission. Improved with reglan and Zofran. Last meal completion charted as 100%. RD to provide supplementation to maximize kcal and protein this admission.   Denies weight loss PTA. Endorses a UBW of 260 lb.   I/O: +4,970 ml since admit UOP: 750 ml x 24 hrs    Medications: phoslo, aranesp, SS novolog, sodium bicarb Labs: Cr 7.59-trending down Phosphorus 8.2 (H) CBG 114-166  Diet Order:   Diet Order            Diet Carb Modified Fluid consistency: Thin; Room service appropriate? Yes  Diet effective now                 EDUCATION NEEDS:   Not appropriate for education at this time  Skin:  Skin Assessment: Reviewed RN Assessment  Last BM:  8/3  Height:   Ht Readings from Last 1 Encounters:  06/10/20 5\' 10"  (1.778 m)    Weight:   Wt Readings from Last 1 Encounters:  06/11/20 (!) 124.9 kg    BMI:  Body mass index is 39.51 kg/m.  Estimated Nutritional Needs:   Kcal:  1900-2100 kcal  Protein:  95-10 grams  Fluid:  >/= 1.9 L/day   Mariana Single RD, LDN Clinical Nutrition Pager listed in [place]

## 2020-06-19 LAB — GLUCOSE, CAPILLARY
Glucose-Capillary: 198 mg/dL — ABNORMAL HIGH (ref 70–99)
Glucose-Capillary: 218 mg/dL — ABNORMAL HIGH (ref 70–99)
Glucose-Capillary: 216 mg/dL — ABNORMAL HIGH (ref 70–99)
Glucose-Capillary: 194 mg/dL — ABNORMAL HIGH (ref 70–99)

## 2020-06-19 MED ORDER — INSULIN DETEMIR 100 UNIT/ML ~~LOC~~ SOLN
8.0000 [IU] | Freq: Every day | SUBCUTANEOUS | Status: DC
  Administered 2020-06-19 – 2020-06-25 (×7): 8 [IU] via SUBCUTANEOUS
  Filled 2020-06-19 (×8): qty 0.08

## 2020-06-19 MED ORDER — SACCHAROMYCES BOULARDII 250 MG PO CAPS
250.0000 mg | ORAL_CAPSULE | Freq: Two times a day (BID) | ORAL | Status: DC
  Administered 2020-06-19 – 2020-06-26 (×14): 250 mg via ORAL
  Filled 2020-06-19 (×14): qty 1

## 2020-06-19 MED ORDER — INSULIN ASPART 100 UNIT/ML ~~LOC~~ SOLN
2.0000 [IU] | Freq: Three times a day (TID) | SUBCUTANEOUS | Status: DC
  Administered 2020-06-19 – 2020-06-26 (×17): 2 [IU] via SUBCUTANEOUS

## 2020-06-19 MED ORDER — DM-GUAIFENESIN ER 30-600 MG PO TB12
2.0000 | ORAL_TABLET | Freq: Two times a day (BID) | ORAL | Status: AC
  Administered 2020-06-19 – 2020-06-21 (×6): 2 via ORAL
  Filled 2020-06-19 (×6): qty 2

## 2020-06-19 NOTE — Progress Notes (Signed)
PROGRESS NOTE  [name] ZOX:[id number] DOB: 1981-10-16 DOA: 06/10/2020 PCP: Charlott Rakes, MD  HPI/Recap of past 24 hours: Patient admitted to the hospital with a working diagnosis ofseveresepsis due to acute pyelonephritis,end-organ failure hypotensionand AKIpresent on admission.Now with worsening renal function and worsening anemia (Jehova witness).  39 year old female with past medical history for type 2 diabetes mellitus, recurrent urinary tract infections (ESBL Klebsiella), hypertension, CKD stage 3b,history of deep vein thrombosis and obesityclass 2. Patient reported 3 days of lower abdominal pain, associated with nausea and vomiting, no frank dysuria. On July 26 she came to the emergency department but she left without being seen. On her initial physical examination she was febrile 39.1 C, blood pressure 135/69, heart rate 111, respiratory rate 13, oxygen saturation 100%. Her lungswereclear to auscultation bilaterally, heart S1-S2, present rhythmic, her abdomen was soft, tender at the suprapubic region, no guarding or rebound, no lower extremity edema. Sodium 133, potassium 4.3, chloride 99, bicarb 22, glucose 508, BUN 34, creatinine 4.0, lipase 23, AST 14, ALT 16, white count 16.8, hemoglobin 8.5, hematocrit 26.2, platelets 302. SARS COVID-19 negative. Urinalysis >500 glucose, >300 protein, specific gravity 1.017, 21-50 red cells,>50 white cells. CT of the abdomen and pelvis showed right-sided perinephric stranding and mild inflammatory changes around the bladder. Chest radiograph with hilar vascular congestion, right base atelectasis, chronic left base scarring. EKG, 104bpm, left axis deviation, left bundle branch block, sinus rhythm, Q wave V1-V2, poor R wave progression, no significant ST segment changes, lead I/aVL T wave versions.   Patient has been placed on meropenem for antibiotic therapy and aggressive fluid resuscitation for  hypotension.  Patient developed oliguric renal failure, likely related to acute ATN due to hypotension.  Echocardiogram with signs of sepsis induced cardiomyopathy with reduction in LV systolic function and global hypokinesis.  Patient developingearlysigns of acutepulmonary edema, due to volume overload. Did not respond to IV furosemide.  Continue to decrease GFR per serum cr, her Hgb also continue to droop (anemia on chronic renal failure). She has been declining PRBC transfusion due to [name].  Plan for renal replacement therapy, likely this hospitalization.  Patient with worsening renal function, mild signs of volume overload. Patient will likely need renal replacement therapy on this hospitalization.  Ideally anemia need to be corrected in order to make HD catheter placement and renal replacement more safe. She continues to decline PRBC transfusion, despite knowing that there is a risk of bleeding while placing HD catheter. She understands that worsening anemia can further compromise vital organs and provoke death.   Jul 09, 2020: Seen and examined.  Reports 1 loose stool this morning, was constipated recently.  We will start probiotics and continue to monitor.  Intermittent nausea.   Assessment/Plan: Principal Problem:   Severe sepsis (HCC) Active Problems:   DM (diabetes mellitus), type 2, uncontrolled ([place])   Obesity (BMI 30-39.9)   Anxiety   Depression   HTN (hypertension)   Migraines   Acute pyelonephritis   AKI (acute kidney injury) ([place])   CKD (chronic kidney disease) stage 3, GFR 30-59 ml/min  Severe sepsis, improving, due to acute right pyelonephritis, endorgan failure hypotensionand AKI(present on admission). Suspected multidrug resistant Klebsiella ESBL infection.Current cultures no growth, buturineculture from 07/06 positive for ESBL Klebsiella. Completed antibiotic therapy with meropenem. Check cell count today.  Per nephro now with yeast  and was started on diflucan x 5 doses on 8/5  Sepsis induced cardiomyopathyEchocardiogram with LV systolic function 40 to 81%, mild decreased systolic function, with global hypokinesis.  IVC with more than 50% respiratory variability, suggesting elevated RA pressure. Trace pitting bilateral lower extremity edema.  Strict I&O and daily weight  AKI on CKD stage 3b(suspectedbaselinehypertensive and diabetic nephropathy)/hyperkalemia,non anion gap metabolic acidosis/ ATN. Renal US with increased echogenicity both kidneys suggesting chronic renal disease, with right kidney atrophy. Serologies negative: GBM, ANA, PANCA, and complement C3 and C4 is high Cr downtrending Avoid nephrotoxins  Continue phoslo and oral serum bicarboante.  Continue to follow with nephrology recommendations in regards of timing for renal replacement therapy.  Metoclopramide for nausea control.   Uncontrolled T2DM Hgb A1c 7,1/ hypoglucemia. dyslipidemiaThis am fasting glucose166mg /dl.  Continue insulin sliding scale for glucose cover and monitoring. Diabetes coordinator following, appreciate assistance Continue withatorvastatin.   Obesity class 2.Calculated BMI is 39.Athigh risk for inpatient complications.  Depression.Stable. Continue withariprazole and amitriptyline.  HTN.blood pressure 120/63, continue to hold on antihypertensive medications, for now.   Iron deficiency anemia/ anemia ofchronicrenal disease.Serum iron is 24, transferrin saturation is 23, tibc 104, ferritin 1,544. Transferrin 74.Sp IV iron infusion- darbepoetine 08/02. Hg 5.2 use pediatric sticks Repeat CBC AM, minimize blood draws-[name]  Follow cell count today.   Patient continue to be at high risk for worsening renal failure.   Status is: Inpatient  Remains inpatient appropriate because:Inpatient level of care appropriate due to severity of illness   Dispo: The patient is from:  Home  Anticipated d/c is to: Home  Anticipated d/c date is:06/22/20  Patient currently is not medically stable to d/c.  DVT prophylaxis:      Heparin  SQ TID Code Status:              full  Family Communication:       No family at the bedside     Consultants:   Nephrology      Antimicrobials:   Completed meropenem.            Objective: Vitals:   06/18/20 2119 06/18/20 2357 06/19/20 0515 06/19/20 0801  BP: 136/69 (!) 114/57 137/72 123/69  Pulse: (!) 54 (!) 55 67 (!) 52  Resp: 18 16 18 16   Temp: 98.8 F (37.1 C) 98.9 F (37.2 C) 99.2 F (37.3 C) 98.9 F (37.2 C)  TempSrc: Oral Oral Oral Oral  SpO2: 100% 100% 100% 98%  Weight:      Height:        Intake/Output Summary (Last 24 hours) at 06/19/2020 1603 Last data filed at 06/18/2020 2150 Gross per 24 hour  Intake 240 ml  Output 120 ml  Net 120 ml   Filed Weights   06/10/20 0922 06/11/20 0007  Weight: (!) 117.9 kg (!) 124.9 kg    Exam:  . General: 39 y.o. year-old female well developed well nourished in no acute distress.  Alert and oriented x3. . Cardiovascular: Regular rate and rhythm with no rubs or gallops.  No thyromegaly or JVD noted.   Marland Kitchen Respiratory: Clear to auscultation with no wheezes or rales. Good inspiratory effort. . Abdomen: Soft nontender nondistended with normal bowel sounds x4 quadrants. R flank pain/tenderness with palpation . Musculoskeletal: Pitting edema  lower extremity edema bilaterally . Psychiatry: Mood is appropriate for condition and setting   Data Reviewed: CBC: Recent Labs  Lab 06/14/20 0257 06/15/20 0433 06/16/20 0405 06/17/20 0338 06/18/20 1405  WBC 17.6* 15.2* 11.6* 10.7* 11.9*  NEUTROABS 14.5* 11.0* 8.3* 6.9 8.0*  HGB 7.3* 6.5* 5.9* 5.5* 5.2*  HCT 22.6* 20.1* 18.7* 17.7* 16.6*  MCV 89.3 91.4 90.8 90.8 91.7  PLT 383 362 339 358 790*   Basic Metabolic Panel: Recent Labs  Lab 06/14/20 0257 06/15/20 0433 06/16/20 0405  06/17/20 0338 06/18/20 0114  NA 138 137 134* 139 139  K 4.5 4.9 4.7 4.7 4.5  CL 102 102 101 103 105  CO2 25 23 21* 19* 19*  GLUCOSE 203* 165* 247* 188* 166*  BUN 49* 57* 65* 70* 77*  CREATININE 5.68* 6.72* 7.25* 7.77* 7.59*  CALCIUM 8.0* 8.2* 7.9* 7.6* 7.8*  PHOS 5.5*  --   --  8.1* 8.2*   GFR: Estimated Creatinine Clearance: 14.3 mL/min (A) (by C-G formula based on SCr of 7.59 mg/dL (H)). Liver Function Tests: Recent Labs  Lab 06/17/20 0338 06/18/20 0114  ALBUMIN 1.3* 1.5*   No results for input(s): LIPASE, AMYLASE in the last 168 hours. No results for input(s): AMMONIA in the last 168 hours. Coagulation Profile: No results for input(s): INR, PROTIME in the last 168 hours. Cardiac Enzymes: No results for input(s): CKTOTAL, CKMB, CKMBINDEX, TROPONINI in the last 168 hours. BNP (last 3 results) No results for input(s): PROBNP in the last 8760 hours. HbA1C: No results for input(s): HGBA1C in the last 72 hours. CBG: Recent Labs  Lab 06/18/20 0611 06/18/20 1608 06/18/20 2117 06/19/20 0628 06/19/20 1136  GLUCAP 162* 189* 243* 198* 218*   Lipid Profile: No results for input(s): CHOL, HDL, LDLCALC, TRIG, CHOLHDL, LDLDIRECT in the last 72 hours. Thyroid Function Tests: No results for input(s): TSH, T4TOTAL, FREET4, T3FREE, THYROIDAB in the last 72 hours. Anemia Panel: No results for input(s): VITAMINB12, FOLATE, FERRITIN, TIBC, IRON, RETICCTPCT in the last 72 hours. Urine analysis:    Component Value Date/Time   COLORURINE YELLOW 06/16/2020 1330   APPEARANCEUR HAZY (A) 06/16/2020 1330   LABSPEC 1.018 06/16/2020 1330   PHURINE 5.0 06/16/2020 1330   GLUCOSEU 150 (A) 06/16/2020 1330   HGBUR MODERATE (A) 06/16/2020 1330   HGBUR negative 12/03/2010 1046   BILIRUBINUR NEGATIVE 06/16/2020 1330   KETONESUR NEGATIVE 06/16/2020 1330   PROTEINUR >=300 (A) 06/16/2020 1330   UROBILINOGEN 0.2 03/19/2020 1853   NITRITE NEGATIVE 06/16/2020 1330   LEUKOCYTESUR SMALL (A)  06/16/2020 1330   Sepsis Labs: @LABRCNTIP (procalcitonin:4,lacticidven:4)  ) Recent Results (from the past 240 hour(s))  SARS Coronavirus 2 by RT PCR     Status: None   Collection Time: 06/10/20  4:27 PM  Result Value Ref Range Status   SARS Coronavirus 2 NEGATIVE NEGATIVE Final    Comment: (NOTE) Result indicates the ABSENCE of SARS-CoV-2 RNA in the patient specimen.   The lowest concentration of SARS-CoV-2 viral copies this assay can detect in nasopharyngeal swab specimens is 500 copies / mL.  A negative result does not preclude SARS-CoV-2 infection and should not be used as the sole basis for patient management decisions. A negative result may occur with improper specimen collection / handling, submission of a specimen other than nasopharyngeal swab, presence of viral mutation(s) within the areas targeted by this assay, and inadequate number of viral copies (<500 copies / mL) present.  Negative results must be combined with clinical observations, patient history, and epidemiological information.   The expected result is NEGATIVE.   Patient Fact Sheet:  BlogSelections.co.uk    Provider Fact Sheet:  https://lucas.com/    This test is not yet approved or cleared by the [place] FDA and  has been [place] orized for detection and/or diagnosis of SARS-CoV-2 by FDA under an Emergency Use Authorization (EUA).  This EUA will remain in effect (meaning  this test can be used) for the duration of  the COVID-19 declaration under Section 564(b)(1) of the Act, 21 U.S.C. section 360bbb-3(b)(1), unless the authorization is terminated or revoked sooner  Performed at [facility], [place] [address], [place], [place] [address]   Wet prep, genital     Status: Abnormal   Collection Time: 06/10/20  5:58 PM  Result Value Ref Range Status   Yeast Wet Prep HPF POC NONE SEEN NONE SEEN Final   Trich, Wet Prep NONE SEEN NONE SEEN Final   Clue  Cells Wet Prep HPF POC NONE SEEN NONE SEEN Final   WBC, Wet Prep HPF POC MANY (A) NONE SEEN Final   Sperm NONE SEEN  Final    Comment: Performed at [facility], [place] [address], [place], [place] [address]  Culture, blood (routine x 2)     Status: None   Collection Time: 06/10/20  9:10 PM   Specimen: BLOOD  Result Value Ref Range Status   Specimen Description BLOOD LEFT ARM  Final   Special Requests   Final    BOTTLES DRAWN AEROBIC AND ANAEROBIC Blood Culture adequate volume   Culture   Final    NO GROWTH 5 DAYS Performed at [facility], [place] [address] [place], [place] [address]    Report Status 06/15/2020 FINAL  Final  Culture, blood (routine x 2)     Status: None   Collection Time: 06/10/20  9:10 PM   Specimen: BLOOD  Result Value Ref Range Status   Specimen Description BLOOD RIGHT ARM  Final   Special Requests   Final    BOTTLES DRAWN AEROBIC AND ANAEROBIC Blood Culture adequate volume   Culture   Final    NO GROWTH 5 DAYS Performed at [facility], [place] [address] [place], [place] [address]    Report Status 06/15/2020 FINAL  Final  Culture, Urine     Status: None   Collection Time: 06/12/20  5:29 AM   Specimen: Urine, Clean Catch  Result Value Ref Range Status   Specimen Description URINE, CLEAN CATCH  Final   Special Requests NONE  Final   Culture   Final    NO GROWTH Performed at [facility], [place] [address] [place], [place] [address]    Report Status 06/13/2020 FINAL  Final      Studies: No results found.  Scheduled Meds: . (feeding supplement) PROSource Plus  30 mL Oral BID BM  . amitriptyline  10 mg Oral QHS  . atorvastatin  20 mg Oral Daily  . calcium acetate  667 mg Oral TID WC  . Chlorhexidine Gluconate Cloth  6 each Topical Daily  . darbepoetin (ARANESP) injection - NON-DIALYSIS  300 mcg Subcutaneous Q Mon-1800  . dextromethorphan-guaiFENesin  2 tablet Oral BID  . fluconazole  100 mg Oral Daily  . fluticasone  2 spray Each  Nare Daily  . heparin  5,000 Units Subcutaneous Q8H  . insulin aspart  0-9 Units Subcutaneous TID WC  . insulin aspart  2 Units Subcutaneous TID WC  . insulin detemir  8 Units Subcutaneous QHS  . metoCLOPramide (REGLAN) injection  5 mg Intravenous Q6H  . multivitamin with minerals  1 tablet Oral Daily  . sodium bicarbonate  650 mg Oral BID    Continuous Infusions:   LOS: 9 days     Kayleen Memos, MD [facility] Pager [phone]  If 7PM-7AM, please contact night-coverage [email or web address] Password  [email or web address] 06/19/2020, 4:03 PM

## 2020-06-19 NOTE — Progress Notes (Signed)
Inpatient Diabetes Program Recommendations  AACE/ADA: New Consensus Statement on Inpatient Glycemic Control (2015)  Target Ranges:  Prepandial:   less than 140 mg/dL      Peak postprandial:   less than 180 mg/dL (1-2 hours)      Critically ill patients:  140 - 180 mg/dL   Lab Results  Component Value Date   GLUCAP 218 (H) 06/19/2020   HGBA1C 12.9 (H) 06/11/2020    Review of Glycemic Control Results for [name], [name] (MRN [id number]) as of 06/19/2020 13:13  Ref. Range 06/18/2020 16:08 06/18/2020 21:17 06/19/2020 06:28 06/19/2020 11:36  Glucose-Capillary Latest Ref Range: 70 - 99 mg/dL 189 (H) 243 (H) 198 (H) 218 (H)   Diabetes history: DM 2 Outpatient Diabetes medications:  Novolog 70/30- 20 units q AM and 15 units q PM Current orders for Inpatient glycemic control:  Novolog sensitive tid with meals Inpatient Diabetes Program Recommendations:   Consider adding Levemir 8 units daily and Novolog 2 units tid with meals (meal coverage).   Thanks, Adah Perl, RN, BC-ADM Inpatient Diabetes Coordinator Pager [phone] (8a-5p)

## 2020-06-19 NOTE — Progress Notes (Signed)
Subjective:  Hemodynamically stable-  UOP recorded at 695 but took foley out.  Says maybe a little SOB-  Nausea is controlled-  Back pain   Objective Vital signs in last 24 hours: Vitals:   06/18/20 2119 06/18/20 2357 06/19/20 0515 06/19/20 0801  BP: 136/69 (!) 114/57 137/72 123/69  Pulse: (!) 54 (!) 55 67 (!) 52  Resp: 18 16 18 16   Temp: 98.8 F (37.1 C) 98.9 F (37.2 C) 99.2 F (37.3 C) 98.9 F (37.2 C)  TempSrc: Oral Oral Oral Oral  SpO2: 100% 100% 100% 98%  Weight:      Height:       Weight change:   Intake/Output Summary (Last 24 hours) at 06/19/2020 1051 Last data filed at 06/18/2020 2150 Gross per 24 hour  Intake 240 ml  Output 120 ml  Net 120 ml    Assessment/Plan: 39 year old BF with DM, obesity, frequent UTIs and strong familly history of ESRD.  She presents with pyelonephritis and A on CRF  1.Renal-  baseline crt appears over 2.  Likely due to having one atrophic kidney and DM.  Longstanding history of proteinuria argues for a chronic issue.  Now with A on CRF in the setting of a UTI/ Possible pyelo and some hemodynamic changes that could be reversible.  There have been no absolute indications for dialysis but the trend was not good-  Had nausea that predated admission and  her albumin is 1.3-   she has nephrotic reange proteinuria but not the worst I have seen - so thought to be more c/w  malnourishment from recent illness.     Given the fact that she was failing to thrive of sorts I had planned to start HD.  However, then she was symptomatically improved-  I have a hard time doing an invasive procedure ( HD cath)  For kidney numbers that are not severe that could throw her into crisis from anemia.  Her sister is going to keep trying to talk her into the transfusion-  Will continue to hold on HD cath and initiating HD for now-  Re assess day by day-  crt actually down the other day-  May be trying to turn the corner-  Will order labs next tomorrow to limit blood draws  2.  Hypertension/volume  - BP is adequate-    Only on clonidine as OP-  Currently on hold-   3.  Anemia-  hgb under 6-  Refusing transfusion-  Iron sat is low- ferritin is high but have dosed with iron on 8/2 - will redose today-  Also gave ESA-  She gives permission for both  4. [place]-  Phos is high-  Has been started on phoslo-  Ok to continue -  stopped lokelma-  k under 5 5. Nausea-  On prn zofran and reglan which has improved the situation so less convinced that she is actually uremic-  So holding off on starting HD  6.  UTI/pyelo-  On meropenam-  Culture was actually negative- recheck of U/A still appears to show pyuria, now with yeast-  I  D/c d foley and started diflucan     Louis Meckel    Labs: Basic Metabolic Panel: Recent Labs  Lab 06/14/20 0257 06/15/20 0433 06/16/20 0405 06/17/20 0338 06/18/20 0114  NA 138   < > 134* 139 139  K 4.5   < > 4.7 4.7 4.5  CL 102   < > 101 103 105  CO2 25   < >  21* 19* 19*  GLUCOSE 203*   < > 247* 188* 166*  BUN 49*   < > 65* 70* 77*  CREATININE 5.68*   < > 7.25* 7.77* 7.59*  CALCIUM 8.0*   < > 7.9* 7.6* 7.8*  PHOS 5.5*  --   --  8.1* 8.2*   < > = values in this interval not displayed.   Liver Function Tests: Recent Labs  Lab 06/17/20 0338 06/18/20 0114  ALBUMIN 1.3* 1.5*   No results for input(s): LIPASE, AMYLASE in the last 168 hours. No results for input(s): AMMONIA in the last 168 hours. CBC: Recent Labs  Lab 06/14/20 0257 06/14/20 0257 06/15/20 0433 06/15/20 0433 06/16/20 0405 06/17/20 0338 06/18/20 1405  WBC 17.6*   < > 15.2*   < > 11.6* 10.7* 11.9*  NEUTROABS 14.5*   < > 11.0*   < > 8.3* 6.9 8.0*  HGB 7.3*   < > 6.5*   < > 5.9* 5.5* 5.2*  HCT 22.6*   < > 20.1*   < > 18.7* 17.7* 16.6*  MCV 89.3  --  91.4  --  90.8 90.8 91.7  PLT 383   < > 362   < > 339 358 410*   < > = values in this interval not displayed.   Cardiac Enzymes: No results for input(s): CKTOTAL, CKMB, CKMBINDEX, TROPONINI in the last 168  hours. CBG: Recent Labs  Lab 06/17/20 1709 06/18/20 0611 06/18/20 1608 06/18/20 2117 06/19/20 0628  GLUCAP 114* 162* 189* 243* 198*    Iron Studies:  No results for input(s): IRON, TIBC, TRANSFERRIN, FERRITIN in the last 72 hours. Studies/Results: No results found. Medications: Infusions:   Scheduled Medications: . (feeding supplement) PROSource Plus  30 mL Oral BID BM  . amitriptyline  10 mg Oral QHS  . atorvastatin  20 mg Oral Daily  . calcium acetate  667 mg Oral TID WC  . Chlorhexidine Gluconate Cloth  6 each Topical Daily  . darbepoetin (ARANESP) injection - NON-DIALYSIS  300 mcg Subcutaneous Q Mon-1800  . dextromethorphan-guaiFENesin  2 tablet Oral BID  . fluconazole  100 mg Oral Daily  . fluticasone  2 spray Each Nare Daily  . heparin  5,000 Units Subcutaneous Q8H  . insulin aspart  0-9 Units Subcutaneous TID WC  . metoCLOPramide (REGLAN) injection  5 mg Intravenous Q6H  . multivitamin with minerals  1 tablet Oral Daily  . sodium bicarbonate  650 mg Oral BID    have reviewed scheduled and prn medications.  Physical Exam: General:  Looking stable Heart: brady Lungs: mostly clear Abdomen: obese, soft, non tender Extremities: min edema    06/19/2020,10:51 AM  LOS: 9 days       ]

## 2020-06-19 NOTE — Progress Notes (Signed)
Patient with complaints of SOB. HR elevated, O2 100% RA. Applied oxygen at 2 liters.  Educated patient on calming breaths and sitting upright.  Patient had walked to bathroom to void and SOB getting back to bed.  Patient has not been active except to go to bathroom.  RN Nydaye aware and will follow up. Pt resting with call bell within reach. No SOB. Will continue to monitor.

## 2020-06-20 ENCOUNTER — Inpatient Hospital Stay (HOSPITAL_COMMUNITY): Payer: BC Managed Care – PPO

## 2020-06-20 DIAGNOSIS — I420 Dilated cardiomyopathy: Secondary | ICD-10-CM

## 2020-06-20 DIAGNOSIS — I5020 Unspecified systolic (congestive) heart failure: Secondary | ICD-10-CM

## 2020-06-20 LAB — GLUCOSE, CAPILLARY
Glucose-Capillary: 206 mg/dL — ABNORMAL HIGH (ref 70–99)
Glucose-Capillary: 234 mg/dL — ABNORMAL HIGH (ref 70–99)
Glucose-Capillary: 122 mg/dL — ABNORMAL HIGH (ref 70–99)
Glucose-Capillary: 135 mg/dL — ABNORMAL HIGH (ref 70–99)

## 2020-06-20 LAB — RENAL FUNCTION PANEL
Albumin: 1.5 g/dL — ABNORMAL LOW (ref 3.5–5.0)
Anion gap: 13 (ref 5–15)
BUN: 76 mg/dL — ABNORMAL HIGH (ref 6–20)
CO2: 23 mmol/L (ref 22–32)
Calcium: 8.2 mg/dL — ABNORMAL LOW (ref 8.9–10.3)
Chloride: 104 mmol/L (ref 98–111)
Creatinine, Ser: 5.84 mg/dL — ABNORMAL HIGH (ref 0.44–1.00)
GFR calc Af Amer: 10 mL/min — ABNORMAL LOW (ref >60)
GFR calc non Af Amer: 8 mL/min — ABNORMAL LOW (ref >60)
Glucose, Bld: 172 mg/dL — ABNORMAL HIGH (ref 70–99)
Phosphorus: 6 mg/dL — ABNORMAL HIGH (ref 2.5–4.6)
Potassium: 4.1 mmol/L (ref 3.5–5.1)
Sodium: 140 mmol/L (ref 135–145)

## 2020-06-20 MED ORDER — HYDRALAZINE HCL 10 MG PO TABS
10.0000 mg | ORAL_TABLET | Freq: Three times a day (TID) | ORAL | Status: DC
  Administered 2020-06-20 – 2020-06-26 (×19): 10 mg via ORAL
  Filled 2020-06-20 (×19): qty 1

## 2020-06-20 MED ORDER — CARVEDILOL 3.125 MG PO TABS
3.1250 mg | ORAL_TABLET | Freq: Two times a day (BID) | ORAL | Status: DC
  Administered 2020-06-20: 3.125 mg via ORAL
  Filled 2020-06-20: qty 1

## 2020-06-20 NOTE — Evaluation (Signed)
Physical Therapy Evaluation Patient Details Name: [name] MRN: [id number] DOB: 01-05-1981 Today's Date: 06/20/2020   History of Present Illness  Pt adm with severs sepsis due to rt pyelonephritis. Pt also with AKI on CKD and sepsis induced cardiomyopathy. PMH - recurrent UTIs, obesity, DM, anxiety/depression, htn, migraines.  Clinical Impression  Pt presents with decr activity tolerance due to dyspnea. Pt's very low Hgb likely cause for dyspnea. Explained to pt that she would need to do short bursts of activity with rest in between until Hgb returned to normal. Pt used O2 at 2L for comfort with SpO2 100%. Will follow acutely to incr activity tolerance but don't feel pt will need PT at time of DC.     Follow Up Recommendations No PT follow up    Equipment Recommendations  None recommended by PT    Recommendations for Other Services       Precautions / Restrictions Precautions Precautions: None      Mobility  Bed Mobility Overal bed mobility: Independent             General bed mobility comments: Pt sitting on EOB  Transfers Overall transfer level: Modified independent                  Ambulation/Gait Ambulation/Gait assistance: Supervision;Min guard Gait Distance (Feet): 200 Feet Assistive device: None Gait Pattern/deviations: Step-through pattern;Decreased stride length Gait velocity: decr Gait velocity interpretation: 1.31 - 2.62 ft/sec, indicative of limited community ambulator General Gait Details: Pt with slightly guarded gait in hallway with dyspnea 3-4/4. Pt on 2L o2 with SpO2 100%  Stairs            Wheelchair Mobility    Modified Rankin (Stroke Patients Only)       Balance Overall balance assessment: No apparent balance deficits (not formally assessed)                                           Pertinent Vitals/Pain Pain Assessment: No/denies pain    Home Living Family/patient expects to be discharged  to:: Private residence Living Arrangements: Other (Comment);Other relatives (sister and brother) Available Help at Discharge: Family;Available PRN/intermittently Type of Home: Apartment Home Access: Stairs to enter Entrance Stairs-Rails: Right Entrance Stairs-Number of Steps: 18 Home Layout: One level Home Equipment: None      Prior Function Level of Independence: Independent               Hand Dominance        Extremity/Trunk Assessment   Upper Extremity Assessment Upper Extremity Assessment: Overall WFL for tasks assessed    Lower Extremity Assessment Lower Extremity Assessment: Overall WFL for tasks assessed       Communication   Communication: No difficulties  Cognition Arousal/Alertness: Awake/alert Behavior During Therapy: WFL for tasks assessed/performed Overall Cognitive Status: Within Functional Limits for tasks assessed                                        General Comments      Exercises     Assessment/Plan    PT Assessment Patient needs continued PT services  PT Problem List Decreased activity tolerance;Decreased mobility;Obesity;Cardiopulmonary status limiting activity       PT Treatment Interventions Gait training;Functional mobility training;Patient/family education;Therapeutic activities;Therapeutic exercise;Stair training  PT Goals (Current goals can be found in the Care Plan section)  Acute Rehab PT Goals Patient Stated Goal: get better PT Goal Formulation: With patient Time For Goal Achievement: 06/27/20 Potential to Achieve Goals: Good    Frequency Min 3X/week   Barriers to discharge Inaccessible home environment stairs to enter    Co-evaluation               AM-PAC PT "6 Clicks" Mobility  Outcome Measure Help needed turning from your back to your side while in a flat bed without using bedrails?: None Help needed moving from lying on your back to sitting on the side of a flat bed without using  bedrails?: None Help needed moving to and from a bed to a chair (including a wheelchair)?: None Help needed standing up from a chair using your arms (e.g., wheelchair or bedside chair)?: None Help needed to walk in hospital room?: None Help needed climbing 3-5 steps with a railing? : A Little 6 Click Score: 23    End of Session   Activity Tolerance: Patient limited by fatigue (dyspnea) Patient left: in bed;with call bell/phone within reach (sitting EOB)   PT Visit Diagnosis: Other abnormalities of gait and mobility (R26.89)    Time: 1093-2355 PT Time Calculation (min) (ACUTE ONLY): 18 min   Charges:   PT Evaluation $PT Eval Low Complexity: [place] Pager [phone] Office [place] 06/20/2020, 2:40 PM
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
## 2020-06-20 NOTE — Progress Notes (Signed)
Subjective:  Tachy, SOB - I told her is likely due to her low blood count-  Min UOP recorded but she says lots-  crt down a good bit on lab check today    Objective Vital signs in last 24 hours: Vitals:   06/19/20 2035 06/19/20 2130 06/20/20 0030 06/20/20 0430  BP: (!) 153/86 (!) 153/86 111/75 113/67  Pulse: (!) 105 (!) 105 99 97  Resp: 16 16 16 16   Temp: 99.7 F (37.6 C) 99.7 F (37.6 C) 98.9 F (37.2 C) 98.9 F (37.2 C)  TempSrc:  Oral Oral Oral  SpO2:  100% 99% 99%  Weight:      Height:       Weight change:   Intake/Output Summary (Last 24 hours) at 06/20/2020 1016 Last data filed at 06/19/2020 2100 Gross per 24 hour  Intake --  Output 150 ml  Net -150 ml    Assessment/Plan: 39 year old BF with DM, obesity, frequent UTIs and strong familly history of ESRD.  She presents with pyelonephritis and A on CRF  1.Renal-  baseline crt appears over 2.  Due to having one atrophic kidney and DM.  Longstanding history of proteinuria argues for a chronic issue.  Now with A on CRF in the setting of a UTI/ Possible pyelo and some hemodynamic changes that could be reversible.  There have been no absolute indications for dialysis-  Had nausea that predated admission and  her albumin is 1.3-   she has nephrotic reange proteinuria but not that severe- so thought to be more c/w  malnourishment from recent illness.     Given the fact that she was failing to thrive of sorts I had planned to start HD.  However, then she was symptomatically improved-  I struggled with doing an invasive procedure ( HD cath)  for kidney numbers that are not severe that could throw her into crisis from anemia.  She will not be talked into transfusion-  -  crt actually down,   May be trying to turn the corner-  Will order labs next on Monday to limit blood draws  2. Hypertension/volume  - BP is adequate-    Only on clonidine as OP-  Currently on hold-   3.  Anemia-  hgb under 6-  Refusing transfusion-  Iron sat is low-  ferritin is high - have dosed with iron on 8/2  and 8/5-  Also gave ESA-  darbe 300 per week-  On Monday 4. [place]-  Phos is high-  Has been started on phoslo-  Ok to continue -  stopped lokelma-  k under 5 5. Nausea-  On prn zofran and reglan which has improved the situation so less convinced that she is actually uremic-  So holding off on starting HD  6.  UTI/pyelo-  On meropenam, finished course-  Culture was actually negative- recheck of U/A still appeared to show pyuria, now with yeast-  I  D/c d foley and started diflucan     Louis Meckel    Labs: Basic Metabolic Panel: Recent Labs  Lab 06/17/20 0338 06/18/20 0114 06/20/20 0251  NA 139 139 140  K 4.7 4.5 4.1  CL 103 105 104  CO2 19* 19* 23  GLUCOSE 188* 166* 172*  BUN 70* 77* 76*  CREATININE 7.77* 7.59* 5.84*  CALCIUM 7.6* 7.8* 8.2*  PHOS 8.1* 8.2* 6.0*   Liver Function Tests: Recent Labs  Lab 06/17/20 0338 06/18/20 0114 06/20/20 0251  ALBUMIN 1.3* 1.5*  1.5*   No results for input(s): LIPASE, AMYLASE in the last 168 hours. No results for input(s): AMMONIA in the last 168 hours. CBC: Recent Labs  Lab 06/14/20 0257 06/14/20 0257 06/15/20 0433 06/15/20 0433 06/16/20 0405 06/17/20 0338 06/18/20 1405  WBC 17.6*   < > 15.2*   < > 11.6* 10.7* 11.9*  NEUTROABS 14.5*   < > 11.0*   < > 8.3* 6.9 8.0*  HGB 7.3*   < > 6.5*   < > 5.9* 5.5* 5.2*  HCT 22.6*   < > 20.1*   < > 18.7* 17.7* 16.6*  MCV 89.3  --  91.4  --  90.8 90.8 91.7  PLT 383   < > 362   < > 339 358 410*   < > = values in this interval not displayed.   Cardiac Enzymes: No results for input(s): CKTOTAL, CKMB, CKMBINDEX, TROPONINI in the last 168 hours. CBG: Recent Labs  Lab 06/19/20 0628 06/19/20 1136 06/19/20 1634 06/19/20 2211 06/20/20 0956  GLUCAP 198* 218* 216* 194* 206*    Iron Studies:  No results for input(s): IRON, TIBC, TRANSFERRIN, FERRITIN in the last 72 hours. Studies/Results: No results  found. Medications: Infusions:   Scheduled Medications: . (feeding supplement) PROSource Plus  30 mL Oral BID BM  . amitriptyline  10 mg Oral QHS  . atorvastatin  20 mg Oral Daily  . calcium acetate  667 mg Oral TID WC  . Chlorhexidine Gluconate Cloth  6 each Topical Daily  . darbepoetin (ARANESP) injection - NON-DIALYSIS  300 mcg Subcutaneous Q Mon-1800  . dextromethorphan-guaiFENesin  2 tablet Oral BID  . fluconazole  100 mg Oral Daily  . fluticasone  2 spray Each Nare Daily  . heparin  5,000 Units Subcutaneous Q8H  . insulin aspart  0-9 Units Subcutaneous TID WC  . insulin aspart  2 Units Subcutaneous TID WC  . insulin detemir  8 Units Subcutaneous QHS  . metoCLOPramide (REGLAN) injection  5 mg Intravenous Q6H  . multivitamin with minerals  1 tablet Oral Daily  . saccharomyces boulardii  250 mg Oral BID  . sodium bicarbonate  650 mg Oral BID    have reviewed scheduled and prn medications.  Physical Exam: General:  Some SOB Heart: brady Lungs: mostly clear Abdomen: obese, soft, non tender Extremities: min edema    06/20/2020,10:16 AM  LOS: 10 days       ]

## 2020-06-20 NOTE — Progress Notes (Signed)
PROGRESS NOTE  [name] XTK:[id number] DOB: [birth year] DOA: 06/10/2020 PCP: Charlott Rakes, MD  HPI/Recap of past 24 hours: Patient admitted to the hospital with a working diagnosis ofseveresepsis due to acute pyelonephritis,end-organ failure hypotensionand AKIpresent on admission.Now with worsening renal function and worsening anemia ([place]ehova witness).  39 year old female with past medical history for type 2 diabetes mellitus, recurrent urinary tract infections (ESBL Klebsiella), hypertension, CKD stage 3b,history of deep vein thrombosis and obesityclass 2. Patient reported 3 days of lower abdominal pain, associated with nausea and vomiting, no frank dysuria. On July 26 she came to the emergency department but she left without being seen. On her initial physical examination she was febrile 39.1 C, blood pressure 135/69, heart rate 111, respiratory rate 13, oxygen saturation 100%. Her lungswereclear to auscultation bilaterally, heart S1-S2, present rhythmic, her abdomen was soft, tender at the suprapubic region, no guarding or rebound, no lower extremity edema. Sodium 133, potassium 4.3, chloride 99, bicarb 22, glucose 508, BUN 34, creatinine 4.0, lipase 23, AST 14, ALT 16, white count 16.8, hemoglobin 8.5, hematocrit 26.2, platelets 302. SARS COVID-19 negative. Urinalysis >500 glucose, >300 protein, specific gravity 1.017, 21-50 red cells,>50 white cells. CT of the abdomen and pelvis showed right-sided perinephric stranding and mild inflammatory changes around the bladder. Chest radiograph with hilar vascular congestion, right base atelectasis, chronic left base scarring. EKG, 104bpm, left axis deviation, left bundle branch block, sinus rhythm, Q wave V1-V2, poor R wave progression, no significant ST segment changes, lead I/aVL T wave versions.   Patient has been placed on meropenem for antibiotic therapy and aggressive fluid resuscitation for  hypotension.  Patient developed oliguric renal failure, likely related to acute ATN due to hypotension.  Echocardiogram with signs of sepsis induced cardiomyopathy with reduction in LV systolic function and global hypokinesis.  Patient developingearlysigns of acutepulmonary edema, due to volume overload. Did not respond to IV furosemide.  Continue to decrease GFR per serum cr, her Hgb also continue to droop (anemia on chronic renal failure). She has been declining PRBC transfusion due to [name].  Plan for renal replacement therapy, likely this hospitalization.  Patient with worsening renal function, mild signs of volume overload. Patient will likely need renal replacement therapy on this hospitalization.  Ideally anemia need to be corrected in order to make HD catheter placement and renal replacement more safe. She continues to decline PRBC transfusion, despite knowing that there is a risk of bleeding while placing HD catheter. She understands that worsening anemia can further compromise vital organs and provoke death.   06-21-2020: Seen and examined.  Dyspneic overnight, currently on 2 L O2 sat 100%.  Persistent dry cough.  Bilateral lower extremity edema.  Abnormal 2D echo with pulmonary edema on chest x-ray done this morning.  Cardiology has been consulted to assist with the management of newly diagnosed acute systolic CHF.     Assessment/Plan: Principal Problem:   Severe sepsis (HCC) Active Problems:   DM (diabetes mellitus), type 2, uncontrolled ([place])   Obesity (BMI 30-39.9)   Anxiety   Depression   HTN (hypertension)   Migraines   Acute pyelonephritis   AKI (acute kidney injury) ([place])   CKD (chronic kidney disease) stage 3, GFR 30-59 ml/min  Severe sepsis, improving, due to acute right pyelonephritis, endorgan failure hypotensionand AKI(present on admission). Suspected multidrug resistant Klebsiella ESBL infection.Current cultures no growth,  buturineculture from 07/06 positive for ESBL Klebsiella. Completed antibiotic therapy with meropenem. Per nephro now with vagina candidiasis, treated with Diflucan.  Newly diagnosed acute systolic CHF 2D echo done on 06/11/2020 showed LVEF 40 to 45% with left ventricle global hypokinesis Independent reviewed chest x-ray done today and previously which showed mild increase in pulmonary vascularity suggestive of pulmonary edema She has bilateral pitting edema in lower extremities. Cardiology consulted to assist with the management. Holding of diuretics due to recent AKI, last creatinine improving 5.84 from 7.59. Strict I's and O's and daily weights Obtain lipid panel 06/22/20 hemoglobin A1c 12.9 on 06/11/2020.  Mild pulmonary edema, suspect cardiogenic No diuretics given due to AKI. Start incentive spirometer and flutter valve Maintain O2 saturation greater than 92%.  Bilateral lower extremity edema Nontender on palpation Advised to elevate legs when sitting  Severe iron deficiency anemia Patient history of malignancy and has declined blood transfusion Last hemoglobin 5.2 Ongoing judicious blood draws Repeat CBC 06/22/2020  AKI on CKD stage 3b(suspectedbaselinehypertensive and diabetic nephropathy)/hyperkalemia,non anion gap metabolic acidosis/ ATN. Renal US with increased echogenicity both kidneys suggesting chronic renal disease, with right kidney atrophy. Serologies negative: GBM, ANA, PANCA, and complement C3 and C4 is high Cr downtrending as stated above Avoid nephrotoxins  Continue phoslo and oral serum bicarboante.  Continue to follow with nephrology recommendations in regards of timing for renal replacement therapy.  Repeat renal panel on 06/22/2020. Appreciate nephrology's assistance.  Uncontrolled T2DM Hgb with hyperglycemia. Hemoglobin A1c 12.9 on 06/11/2020. Continue insulin sliding scale for glucose cover and monitoring. Diabetes coordinator following,  appreciate assistance Continue withatorvastatin.   Obesity class 2.Calculated BMI is 39. Recommend weight loss outpatient with regular physical activity and healthy dieting.  Depression.Stable. Continue withariprazole and amitriptyline.  HTN. Blood pressure currently not at goal, defer to cardiology to start beta-blocker.  Intermittent tachycardia Obtain twelve-lead EKG Will benefit from beta-blocker  Physical debility PT to assess  Status is: Inpatient  Remains inpatient appropriate because:Inpatient level of care appropriate due to severity of illness   Dispo: The patient is from: Home  Anticipated d/c is to: Home  Anticipated d/c date is:06/22/20  Patient currently is not medically stable to d/c.  DVT prophylaxis:      Heparin  SQ TID Code Status:              full  Family Communication:       No family at the bedside     Consultants:   Nephrology    Cardiology.  Antimicrobials:   Completed meropenem.            Objective: Vitals:   06/19/20 2130 06/20/20 0030 06/20/20 0430 06/20/20 0900  BP: (!) 153/86 111/75 113/67 (!) 144/87  Pulse: (!) 105 99 97 (!) 103  Resp: 16 16 16 19   Temp: 99.7 F (37.6 C) 98.9 F (37.2 C) 98.9 F (37.2 C) 98.4 F (36.9 C)  TempSrc: Oral Oral Oral Oral  SpO2: 100% 99% 99% 100%  Weight:      Height:        Intake/Output Summary (Last 24 hours) at 06/20/2020 1256 Last data filed at 06/19/2020 2100 Gross per 24 hour  Intake --  Output 150 ml  Net -150 ml   Filed Weights   06/10/20 0922 06/11/20 0007  Weight: (!) 117.9 kg (!) 124.9 kg    Exam:  . General: 39 y.o. year-old female obese in no acute stress.  Alert oriented x3.  . Cardiovascular: Regular rate and rhythm no rubs or gallops.   Marland Kitchen Respiratory: Mild rales at bases no wheezing noted.  Good inspiratory effort.   . Abdomen:  Obese nontender normal bowel sounds present. R flank pain/tenderness with  palpation . Musculoskeletal: 1+ pitting edema in lower extremities bilaterally. Marland Kitchen Psychiatry: Mood is appropriate for condition and setting.  Data Reviewed: CBC: Recent Labs  Lab 06/14/20 0257 06/15/20 0433 06/16/20 0405 06/17/20 0338 06/18/20 1405  WBC 17.6* 15.2* 11.6* 10.7* 11.9*  NEUTROABS 14.5* 11.0* 8.3* 6.9 8.0*  HGB 7.3* 6.5* 5.9* 5.5* 5.2*  HCT 22.6* 20.1* 18.7* 17.7* 16.6*  MCV 89.3 91.4 90.8 90.8 91.7  PLT 383 362 339 358 818*   Basic Metabolic Panel: Recent Labs  Lab 06/14/20 0257 06/14/20 0257 06/15/20 0433 06/16/20 0405 06/17/20 0338 06/18/20 0114 06/20/20 0251  NA 138   < > 137 134* 139 139 140  K 4.5   < > 4.9 4.7 4.7 4.5 4.1  CL 102   < > 102 101 103 105 104  CO2 25   < > 23 21* 19* 19* 23  GLUCOSE 203*   < > 165* 247* 188* 166* 172*  BUN 49*   < > 57* 65* 70* 77* 76*  CREATININE 5.68*   < > 6.72* 7.25* 7.77* 7.59* 5.84*  CALCIUM 8.0*   < > 8.2* 7.9* 7.6* 7.8* 8.2*  PHOS 5.5*  --   --   --  8.1* 8.2* 6.0*   < > = values in this interval not displayed.   GFR: Estimated Creatinine Clearance: 18.6 mL/min (A) (by C-G formula based on SCr of 5.84 mg/dL (H)). Liver Function Tests: Recent Labs  Lab 06/17/20 0338 06/18/20 0114 06/20/20 0251  ALBUMIN 1.3* 1.5* 1.5*   No results for input(s): LIPASE, AMYLASE in the last 168 hours. No results for input(s): AMMONIA in the last 168 hours. Coagulation Profile: No results for input(s): INR, PROTIME in the last 168 hours. Cardiac Enzymes: No results for input(s): CKTOTAL, CKMB, CKMBINDEX, TROPONINI in the last 168 hours. BNP (last 3 results) No results for input(s): PROBNP in the last 8760 hours. HbA1C: No results for input(s): HGBA1C in the last 72 hours. CBG: Recent Labs  Lab 06/19/20 1136 06/19/20 1634 06/19/20 2211 06/20/20 0956 06/20/20 1105  GLUCAP 218* 216* 194* 206* 234*   Lipid Profile: No results for input(s): CHOL, HDL, LDLCALC, TRIG, CHOLHDL, LDLDIRECT in the last 72 hours. Thyroid  Function Tests: No results for input(s): TSH, T4TOTAL, FREET4, T3FREE, THYROIDAB in the last 72 hours. Anemia Panel: No results for input(s): VITAMINB12, FOLATE, FERRITIN, TIBC, IRON, RETICCTPCT in the last 72 hours. Urine analysis:    Component Value Date/Time   COLORURINE YELLOW 06/16/2020 1330   APPEARANCEUR HAZY (A) 06/16/2020 1330   LABSPEC 1.018 06/16/2020 1330   PHURINE 5.0 06/16/2020 1330   GLUCOSEU 150 (A) 06/16/2020 1330   HGBUR MODERATE (A) 06/16/2020 1330   HGBUR negative 12/03/2010 1046   BILIRUBINUR NEGATIVE 06/16/2020 1330   KETONESUR NEGATIVE 06/16/2020 1330   PROTEINUR >=300 (A) 06/16/2020 1330   UROBILINOGEN 0.2 03/19/2020 1853   NITRITE NEGATIVE 06/16/2020 1330   LEUKOCYTESUR SMALL (A) 06/16/2020 1330   Sepsis Labs: @LABRCNTIP (procalcitonin:4,lacticidven:4)  ) Recent Results (from the past 240 hour(s))  SARS Coronavirus 2 by RT PCR     Status: None   Collection Time: 06/10/20  4:27 PM  Result Value Ref Range Status   SARS Coronavirus 2 NEGATIVE NEGATIVE Final    Comment: (NOTE) Result indicates the ABSENCE of SARS-CoV-2 RNA in the patient specimen.   The lowest concentration of SARS-CoV-2 viral copies this assay can detect in nasopharyngeal swab specimens is 500 copies /  mL.  A negative result does not preclude SARS-CoV-2 infection and should not be used as the sole basis for patient management decisions. A negative result may occur with improper specimen collection / handling, submission of a specimen other than nasopharyngeal swab, presence of viral mutation(s) within the areas targeted by this assay, and inadequate number of viral copies (<500 copies / mL) present.  Negative results must be combined with clinical observations, patient history, and epidemiological information.   The expected result is NEGATIVE.   Patient Fact Sheet:  BlogSelections.co.uk    Provider Fact Sheet:  https://lucas.com/     This test is not yet approved or cleared by the [place] FDA and  has been [place] orized for detection and/or diagnosis of SARS-CoV-2 by FDA under an Emergency Use Authorization (EUA).  This EUA will remain in effect (meaning this test can be used) for the duration of  the COVID-19 declaration under Section 564(b)(1) of the Act, 21 U.S.C. section 360bbb-3(b)(1), unless the authorization is terminated or revoked sooner  Performed at [facility], [place] [address], [place], [place] [address]   Wet prep, genital     Status: Abnormal   Collection Time: 06/10/20  5:58 PM  Result Value Ref Range Status   Yeast Wet Prep HPF POC NONE SEEN NONE SEEN Final   Trich, Wet Prep NONE SEEN NONE SEEN Final   Clue Cells Wet Prep HPF POC NONE SEEN NONE SEEN Final   WBC, Wet Prep HPF POC MANY (A) NONE SEEN Final   Sperm NONE SEEN  Final    Comment: Performed at [facility], [place] [address], [place], [place] [address]  Culture, blood (routine x 2)     Status: None   Collection Time: 06/10/20  9:10 PM   Specimen: BLOOD  Result Value Ref Range Status   Specimen Description BLOOD LEFT ARM  Final   Special Requests   Final    BOTTLES DRAWN AEROBIC AND ANAEROBIC Blood Culture adequate volume   Culture   Final    NO GROWTH 5 DAYS Performed at [facility], [place] [address], [place], [place] [address]    Report Status 06/15/2020 FINAL  Final  Culture, blood (routine x 2)     Status: None   Collection Time: 06/10/20  9:10 PM   Specimen: BLOOD  Result Value Ref Range Status   Specimen Description BLOOD RIGHT ARM  Final   Special Requests   Final    BOTTLES DRAWN AEROBIC AND ANAEROBIC Blood Culture adequate volume   Culture   Final    NO GROWTH 5 DAYS Performed at [facility], [place] [address] [place], [place] [address]    Report Status 06/15/2020 FINAL  Final  Culture, Urine     Status: None   Collection Time: 06/12/20  5:29 AM   Specimen: Urine, Clean Catch  Result Value  Ref Range Status   Specimen Description URINE, CLEAN CATCH  Final   Special Requests NONE  Final   Culture   Final    NO GROWTH Performed at [facility], [place] [address] [place], [place] [address]    Report Status 06/13/2020 FINAL  Final      Studies: DG CHEST PORT 1 VIEW  Result Date: 06/20/2020 CLINICAL DATA:  Evaluate for atelectasis, cardiopulmonary disease. EXAM: PORTABLE CHEST 1 VIEW COMPARISON:  06/14/2020 FINDINGS: Shallow lung inflation. There is increased prominence of interstitial markings particularly at the lung bases, LEFT greater  than RIGHT. Heart size is likely normal accounting for patient position. IMPRESSION: Increased interstitial markings at the lung bases, LEFT greater than RIGHT. Electronically Signed   By: Nolon Nations M.D.   On: 06/20/2020 10:18    Scheduled Meds: . (feeding supplement) PROSource Plus  30 mL Oral BID BM  . amitriptyline  10 mg Oral QHS  . atorvastatin  20 mg Oral Daily  . calcium acetate  667 mg Oral TID WC  . Chlorhexidine Gluconate Cloth  6 each Topical Daily  . darbepoetin (ARANESP) injection - NON-DIALYSIS  300 mcg Subcutaneous Q Mon-1800  . dextromethorphan-guaiFENesin  2 tablet Oral BID  . fluconazole  100 mg Oral Daily  . fluticasone  2 spray Each Nare Daily  . heparin  5,000 Units Subcutaneous Q8H  . insulin aspart  0-9 Units Subcutaneous TID WC  . insulin aspart  2 Units Subcutaneous TID WC  . insulin detemir  8 Units Subcutaneous QHS  . metoCLOPramide (REGLAN) injection  5 mg Intravenous Q6H  . multivitamin with minerals  1 tablet Oral Daily  . saccharomyces boulardii  250 mg Oral BID  . sodium bicarbonate  650 mg Oral BID    Continuous Infusions:   LOS: 10 days     Kayleen Memos, MD [facility] Pager [phone]  If 7PM-7AM, please contact night-coverage [email or web address] Password [email or web address] 06/20/2020, 12:56 PM

## 2020-06-21 ENCOUNTER — Inpatient Hospital Stay (HOSPITAL_COMMUNITY): Payer: BC Managed Care – PPO

## 2020-06-21 DIAGNOSIS — I429 Cardiomyopathy, unspecified: Secondary | ICD-10-CM

## 2020-06-21 LAB — GLUCOSE, CAPILLARY
Glucose-Capillary: 127 mg/dL — ABNORMAL HIGH (ref 70–99)
Glucose-Capillary: 189 mg/dL — ABNORMAL HIGH (ref 70–99)
Glucose-Capillary: 196 mg/dL — ABNORMAL HIGH (ref 70–99)
Glucose-Capillary: 178 mg/dL — ABNORMAL HIGH (ref 70–99)

## 2020-06-21 LAB — NM MYOCAR MULTI W/SPECT W/WALL MOTION / EF
Estimated workload: 1 METS
Exercise duration (min): 0 min
Exercise duration (sec): 0 s
MPHR: 181 {beats}/min
Peak HR: 107 {beats}/min
Percent HR: 59 %
Rest HR: 97 {beats}/min

## 2020-06-21 MED ORDER — CARVEDILOL 6.25 MG PO TABS
6.2500 mg | ORAL_TABLET | Freq: Two times a day (BID) | ORAL | Status: DC
  Administered 2020-06-21 – 2020-06-22 (×3): 6.25 mg via ORAL
  Filled 2020-06-21 (×3): qty 1

## 2020-06-21 MED ORDER — REGADENOSON 0.4 MG/5ML IV SOLN
INTRAVENOUS | Status: AC
  Administered 2020-06-21: 0.4 mg via INTRAVENOUS
  Filled 2020-06-21: qty 5

## 2020-06-21 MED ORDER — ISOSORBIDE MONONITRATE ER 30 MG PO TB24
15.0000 mg | ORAL_TABLET | Freq: Every day | ORAL | Status: DC
  Administered 2020-06-21 – 2020-06-22 (×2): 15 mg via ORAL
  Filled 2020-06-21 (×2): qty 1

## 2020-06-21 MED ORDER — REGADENOSON 0.4 MG/5ML IV SOLN
0.4000 mg | Freq: Once | INTRAVENOUS | Status: AC
  Filled 2020-06-21: qty 5

## 2020-06-21 MED ORDER — TECHNETIUM TC 99M TETROFOSMIN IV KIT
10.6000 | PACK | Freq: Once | INTRAVENOUS | Status: AC | PRN
  Administered 2020-06-21: 10.6 via INTRAVENOUS

## 2020-06-21 MED ORDER — TECHNETIUM TC 99M TETROFOSMIN IV KIT
30.9000 | PACK | Freq: Once | INTRAVENOUS | Status: AC | PRN
  Administered 2020-06-21: 30.9 via INTRAVENOUS

## 2020-06-21 NOTE — Progress Notes (Signed)
Progress Note  Patient Name: [name] Date of Encounter: 06/21/2020  [place] Cardiologist: No primary care provider on file.   Subjective   Saw patient down in nuclear chest pain. She does note some chest pain and shortness of breath this morning at rest. States pain feels like someone stabbing her in the chest. She is resting comfortably though and does not look like she is in pain. No palpitations.  Inpatient Medications    Scheduled Meds: . (feeding supplement) PROSource Plus  30 mL Oral BID BM  . amitriptyline  10 mg Oral QHS  . atorvastatin  20 mg Oral Daily  . calcium acetate  667 mg Oral TID WC  . carvedilol  3.125 mg Oral BID WC  . Chlorhexidine Gluconate Cloth  6 each Topical Daily  . darbepoetin (ARANESP) injection - NON-DIALYSIS  300 mcg Subcutaneous Q Mon-1800  . dextromethorphan-guaiFENesin  2 tablet Oral BID  . fluconazole  100 mg Oral Daily  . fluticasone  2 spray Each Nare Daily  . heparin  5,000 Units Subcutaneous Q8H  . hydrALAZINE  10 mg Oral Q8H  . insulin aspart  0-9 Units Subcutaneous TID WC  . insulin aspart  2 Units Subcutaneous TID WC  . insulin detemir  8 Units Subcutaneous QHS  . metoCLOPramide (REGLAN) injection  5 mg Intravenous Q6H  . multivitamin with minerals  1 tablet Oral Daily  . saccharomyces boulardii  250 mg Oral BID  . sodium bicarbonate  650 mg Oral BID   Continuous Infusions:  PRN Meds: acetaminophen **OR** acetaminophen, albuterol, fentaNYL (SUBLIMAZE) injection, ondansetron **OR** ondansetron (ZOFRAN) IV, polyethylene glycol   Vital Signs    Vitals:   06/21/20 0913 06/21/20 0938 06/21/20 0940 06/21/20 0942  BP: (!) 141/78 135/73 (!) 146/86 (!) 151/91  Pulse:      Resp:      Temp:      TempSrc:      SpO2:      Weight:      Height:        Intake/Output Summary (Last 24 hours) at 06/21/2020 0946 Last data filed at 06/20/2020 1900 Gross per 24 hour  Intake --  Output 1400 ml  Net -1400 ml   Last 3  Weights 06/11/2020 06/10/2020 07/29/2019  Weight (lbs) 275 lb 5.7 oz 260 lb 118 lb  Weight (kg) 124.9 kg 117.935 kg 53.524 kg      Telemetry    Unable to review telemetry down in nuclear medicine but patient in sinus rhythm during study. - Personally Reviewed  ECG    No new ECG tracing. - Personally Reviewed  Physical Exam   GEN: Morbidly obese. No acute distress.   Neck: JVD difficult to assess due to body habitus. Cardiac: RRR. No murmurs, rubs, or gallops.  Respiratory: Clear to auscultation bilaterally. GI: Soft, obese, and non-tender. MS: Trace lower extremity edema. No deformity. Skin: Warm and dry. Neuro:  No focal deficits. Psych: Normal affect.  Labs    High Sensitivity Troponin:  No results for input(s): TROPONINIHS in the last 720 hours.    Chemistry Recent Labs  Lab 06/17/20 0338 06/18/20 0114 06/20/20 0251  NA 139 139 140  K 4.7 4.5 4.1  CL 103 105 104  CO2 19* 19* 23  GLUCOSE 188* 166* 172*  BUN 70* 77* 76*  CREATININE 7.77* 7.59* 5.84*  CALCIUM 7.6* 7.8* 8.2*  ALBUMIN 1.3* 1.5* 1.5*  GFRNONAA 6* 6* 8*  GFRAA 7* 7* 10*  ANIONGAP 17*  15 13     Hematology Recent Labs  Lab 06/16/20 0405 06/17/20 0338 06/18/20 1405  WBC 11.6* 10.7* 11.9*  RBC 2.06* 1.95* 1.81*  HGB 5.9* 5.5* 5.2*  HCT 18.7* 17.7* 16.6*  MCV 90.8 90.8 91.7  MCH 28.6 28.2 28.7  MCHC 31.6 31.1 31.3  RDW 12.5 12.8 12.9  PLT 339 358 410*    BNPNo results for input(s): BNP, PROBNP in the last 168 hours.   DDimer No results for input(s): DDIMER in the last 168 hours.   Radiology    DG CHEST PORT 1 VIEW  Result Date: 06/20/2020 CLINICAL DATA:  Evaluate for atelectasis, cardiopulmonary disease. EXAM: PORTABLE CHEST 1 VIEW COMPARISON:  06/14/2020 FINDINGS: Shallow lung inflation. There is increased prominence of interstitial markings particularly at the lung bases, LEFT greater than RIGHT. Heart size is likely normal accounting for patient position. IMPRESSION: Increased  interstitial markings at the lung bases, LEFT greater than RIGHT. Electronically Signed   By: Nolon Nations M.D.   On: 06/20/2020 10:18    Cardiac Studies   Echocardiogram 06/11/2020: Impressions: 1. Left ventricular ejection fraction, by estimation, is 40 to 45%. The  left ventricle has mildly decreased function. The left ventricle  demonstrates global hypokinesis. Left ventricular diastolic parameters are  indeterminate.  2. Right ventricule is not well visualized but grossly normal size and  systolic function. Tricuspid regurgitation signal is inadequate for  assessing PA pressure.  3. The mitral valve is normal in structure. Trivial mitral valve  regurgitation.  4. The aortic valve was not well visualized. Aortic valve regurgitation  is not visualized. No aortic stenosis is present.  5. The inferior vena cava is dilated in size with <50% respiratory  variability, suggesting right atrial pressure of 15 mmHg.   Patient Profile     39 y.o. female with a history of hypertension, type 2 diabetes, CKD stge III, DVT, recurrent UTIs, and morbid obesity who is being seen for evaluation of new dilated cardiomyopathy at the request of Dr. Nevada Crane.  Assessment & Plan    New Onset Systolic CHF - Admitted with sepsis secondary to pyelonephritis with hypotension and worsening renal function.  - Echo showed LVEF of 40-45% with global hypokinesis.  - Net positive 3L this admission due to aggressive hydration for sepsis. - Does not appear significantly volume overloaded on exam but somewhat difficult to tell due to body habitus. - Started on Coreg 3.125mg  twice daily and Hydralazine 10mg  TID yesterday. - BP still elevated. Consider increasing Coreg to 6.25mg  twice daily and adding Imdur. - No ACEI/ARB/ARNI due to CKD. - TSH, SPEP, UPEP pending. - Continue to monitor daily weight, strict I/O's, and renal function. - Having Myoview today. Results pending.   Hypertension - BP still elevated  this morning. - Will increase Coreg to 6.25mg  twice daily.  - Continue Hydralazine as above.  Uncontrolled Type 2 Diabetes Mellitus - Management per primary team.  Severe Sepsis/ Acute Pyelonephritis  - Complicated by hypotension and AKI. - Secondary to resistant Klebsiella ESBL infection - Per primary team/Nephrology.  AKI on CKD Stage IIIb - Creatinine 5.84 yesterday. Today's labs pending. - Felt to be secondary to hypertensive and diabetic nephropathy. - Management per Nephrology.  Severe Anemia - Hemoglobin 5.2.  - Patient a [name] and declines blood products.  - Management per primary team.  For questions or updates, please contact [place] Please consult [email or web address] for contact info under        Signed, Darreld Mclean, PA-C  06/21/2020, 9:46 AM

## 2020-06-21 NOTE — Progress Notes (Signed)
   Myoview showed moderate region of reversible ischemia within anteiror septal wall. Reviewed this with Dr. Margaretann Loveless - some of this may be due to known LBBB but this is suggestive of ischemic cardiomyopathy. Ideally, patient would have cardiac catheterization. However, this is currently not an option given AKI with creatinine of >5 (per Nephrology note, no plans for dialysis at this time and would advise against cardiac cath in the short term due to advanced CKD) and acute anemia with hemoglobin 5.2 (patient a [name] declining blood transfusion). Therefore, will continue to treat medically at this time. Would add antiplatelet therapy if able if hemoglobin stabilizes. Patient on Coreg and Hydralazine. Can consider adding Imdur tomorrow if BP allows. Will notify patient of Myoview results and plans for medical management.  Darreld Mclean, PA-C 06/21/2020 5:21 PM

## 2020-06-21 NOTE — Progress Notes (Signed)
Subjective:  Tachy, SOB still-  Cardiology saw-  Ordered nuclear stress test which she is at right now-  Not seen - at least 1800 of UOP recorded    Objective Vital signs in last 24 hours: Vitals:   06/21/20 0913 06/21/20 0938 06/21/20 0940 06/21/20 0942  BP: (!) 141/78 135/73 (!) 146/86 (!) 151/91  Pulse:      Resp:      Temp:      TempSrc:      SpO2:      Weight:      Height:       Weight change:   Intake/Output Summary (Last 24 hours) at 06/21/2020 1054 Last data filed at 06/20/2020 1900 Gross per 24 hour  Intake --  Output 1400 ml  Net -1400 ml    Assessment/Plan: 39 year old BF with DM, obesity, frequent UTIs and strong familly history of ESRD.  She presents with pyelonephritis and A on CRF  1.Renal-  baseline crt appears over 2.  Due to having one atrophic kidney and DM.  Longstanding history of proteinuria argues for a chronic issue.  Now with A on CRF in the setting of a UTI/ Possible pyelo and some hemodynamic changes that could be reversible.  There have been no absolute indications for dialysis-  Had nausea that predated admission and  her albumin is 1.3-   she has nephrotic reange proteinuria but not that severe- so thought to be more c/w  malnourishment from recent illness.     Given the fact that she was failing to thrive of sorts I had planned to start HD.  However, then she was symptomatically improved-  I struggled with doing an invasive procedure ( HD cath)  for kidney numbers that are not severe that could throw her into crisis from anemia.  She will not be talked into transfusion-  -  crt actually down,   May be trying to turn the corner, non oliguric-  Will order labs next on Monday to limit blood draws but no plans for dialysis at present  2. Hypertension/volume  - BP is adequate-    Only on clonidine as OP-  Currently on hold-   3.  Anemia-  hgb under 6-  Refusing transfusion-  Iron sat is low- ferritin is high - have dosed with iron on 8/2  and 8/5-  Also gave ESA-   darbe 300 per week-  On Monday 4. [place]-  Phos is high-  Has been started on phoslo-  Ok to continue -  stopped lokelma-  k under 5 5. Nausea-  On prn zofran and reglan which has improved the situation so less convinced that she is actually uremic-  So holding off on starting HD  6.  UTI/pyelo-  On meropenam, finished course-  Culture was actually negative- recheck of U/A still appeared to show pyuria, now with yeast-  I  D/c d foley and started diflucan course 7.  Cards-  So pretty sure stress test will be positive with hgb in 5's.  Not sure what to do with that information-  Unless we really think is ischemic would advise against cardiac cath in the short term due to advanced CKD     [place]: Basic Metabolic Panel: Recent Labs  Lab 06/17/20 0338 06/18/20 0114 06/20/20 0251  NA 139 139 140  K 4.7 4.5 4.1  CL 103 105 104  CO2 19* 19* 23  GLUCOSE 188* 166* 172*  BUN  70* 77* 76*  CREATININE 7.77* 7.59* 5.84*  CALCIUM 7.6* 7.8* 8.2*  PHOS 8.1* 8.2* 6.0*   Liver Function Tests: Recent Labs  Lab 06/17/20 0338 06/18/20 0114 06/20/20 0251  ALBUMIN 1.3* 1.5* 1.5*   No results for input(s): LIPASE, AMYLASE in the last 168 hours. No results for input(s): AMMONIA in the last 168 hours. CBC: Recent Labs  Lab 06/15/20 0433 06/15/20 0433 06/16/20 0405 06/17/20 0338 06/18/20 1405  WBC 15.2*   < > 11.6* 10.7* 11.9*  NEUTROABS 11.0*   < > 8.3* 6.9 8.0*  HGB 6.5*   < > 5.9* 5.5* 5.2*  HCT 20.1*   < > 18.7* 17.7* 16.6*  MCV 91.4  --  90.8 90.8 91.7  PLT 362   < > 339 358 410*   < > = values in this interval not displayed.   Cardiac Enzymes: No results for input(s): CKTOTAL, CKMB, CKMBINDEX, TROPONINI in the last 168 hours. CBG: Recent Labs  Lab 06/20/20 0956 06/20/20 1105 06/20/20 1611 06/20/20 2124 06/21/20 0615  GLUCAP 206* 234* 122* 135* 127*    Iron Studies:  No results for input(s): IRON, TIBC, TRANSFERRIN, FERRITIN in the last 72  hours. Studies/Results: DG CHEST PORT 1 VIEW  Result Date: 06/20/2020 CLINICAL DATA:  Evaluate for atelectasis, cardiopulmonary disease. EXAM: PORTABLE CHEST 1 VIEW COMPARISON:  06/14/2020 FINDINGS: Shallow lung inflation. There is increased prominence of interstitial markings particularly at the lung bases, LEFT greater than RIGHT. Heart size is likely normal accounting for patient position. IMPRESSION: Increased interstitial markings at the lung bases, LEFT greater than RIGHT. Electronically Signed   By: Nolon Nations M.D.   On: 06/20/2020 10:18   Medications: Infusions:   Scheduled Medications: . (feeding supplement) PROSource Plus  30 mL Oral BID BM  . amitriptyline  10 mg Oral QHS  . atorvastatin  20 mg Oral Daily  . calcium acetate  667 mg Oral TID WC  . carvedilol  6.25 mg Oral BID WC  . Chlorhexidine Gluconate Cloth  6 each Topical Daily  . darbepoetin (ARANESP) injection - NON-DIALYSIS  300 mcg Subcutaneous Q Mon-1800  . dextromethorphan-guaiFENesin  2 tablet Oral BID  . fluconazole  100 mg Oral Daily  . fluticasone  2 spray Each Nare Daily  . heparin  5,000 Units Subcutaneous Q8H  . hydrALAZINE  10 mg Oral Q8H  . insulin aspart  0-9 Units Subcutaneous TID WC  . insulin aspart  2 Units Subcutaneous TID WC  . insulin detemir  8 Units Subcutaneous QHS  . isosorbide mononitrate  15 mg Oral Daily  . metoCLOPramide (REGLAN) injection  5 mg Intravenous Q6H  . multivitamin with minerals  1 tablet Oral Daily  . saccharomyces boulardii  250 mg Oral BID  . sodium bicarbonate  650 mg Oral BID    have reviewed scheduled and prn medications.  Physical Exam: Not seen today-  Attempted to see twice and she was in nuclear med   06/21/2020,10:54 AM  LOS: 11 days       ]

## 2020-06-21 NOTE — Progress Notes (Signed)
PROGRESS NOTE  [name] DTO:[id number] DOB: 18-Mar-1981 DOA: 06/10/2020 PCP: Charlott Rakes, MD  HPI/Recap of past 24 hours: Patient admitted to the hospital with a working diagnosis ofseveresepsis due to acute pyelonephritis,end-organ failure hypotensionand AKIpresent on admission.Now with worsening renal function and worsening anemia (Jehova witness).  39 year old female with past medical history for type 2 diabetes mellitus, recurrent urinary tract infections (ESBL Klebsiella), hypertension, CKD stage 3b,history of deep vein thrombosis and obesityclass 2. Patient reported 3 days of lower abdominal pain, associated with nausea and vomiting, no frank dysuria. On July 26 she came to the emergency department but she left without being seen. On her initial physical examination she was febrile 39.1 C, blood pressure 135/69, heart rate 111, respiratory rate 13, oxygen saturation 100%. Her lungswereclear to auscultation bilaterally, heart S1-S2, present rhythmic, her abdomen was soft, tender at the suprapubic region, no guarding or rebound, no lower extremity edema. Sodium 133, potassium 4.3, chloride 99, bicarb 22, glucose 508, BUN 34, creatinine 4.0, lipase 23, AST 14, ALT 16, white count 16.8, hemoglobin 8.5, hematocrit 26.2, platelets 302. SARS COVID-19 negative. Urinalysis >500 glucose, >300 protein, specific gravity 1.017, 21-50 red cells,>50 white cells. CT of the abdomen and pelvis showed right-sided perinephric stranding and mild inflammatory changes around the bladder. Chest radiograph with hilar vascular congestion, right base atelectasis, chronic left base scarring. EKG, 104bpm, left axis deviation, left bundle branch block, sinus rhythm, Q wave V1-V2, poor R wave progression, no significant ST segment changes, lead I/aVL T wave versions.   Patient has been placed on meropenem for antibiotic therapy and aggressive fluid resuscitation for  hypotension.  Patient developed oliguric renal failure, likely related to acute ATN due to hypotension.  Echocardiogram with signs of sepsis induced cardiomyopathy with reduction in LV systolic function and global hypokinesis.  Patient developingearlysigns of acutepulmonary edema, due to volume overload. Did not respond to IV furosemide.  Continue to decrease GFR per serum cr, her Hgb also continue to droop (anemia on chronic renal failure). She has been declining PRBC transfusion due to [name].  Plan for renal replacement therapy, likely this hospitalization.  Patient with worsening renal function, mild signs of volume overload. Patient will likely need renal replacement therapy on this hospitalization.  Ideally anemia need to be corrected in order to make HD catheter placement and renal replacement more safe. She continues to decline PRBC transfusion, despite knowing that there is a risk of bleeding while placing HD catheter. She understands that worsening anemia can further compromise vital organs and provoke death.    Seen by cardiology due to new onset CHF. Lexiscan myoview stress test on 06/21/20.  High probability test.     Assessment/Plan: Principal Problem:   Severe sepsis (HCC) Active Problems:   DM (diabetes mellitus), type 2, uncontrolled (HCC)   Obesity (BMI 30-39.9)   Anxiety   Depression   HTN (hypertension)   Migraines   Acute pyelonephritis   AKI (acute kidney injury) ([place])   CKD (chronic kidney disease) stage 3, GFR 30-59 ml/min  Severe sepsis, improving, due to acute right pyelonephritis, endorgan failure hypotensionand AKI(present on admission). Suspected multidrug resistant Klebsiella ESBL infection.Current cultures no growth, buturineculture from 07/06 positive for ESBL Klebsiella. Completed antibiotic therapy with meropenem. Per nephro now with vagina candidiasis, treated with Diflucan.    Newly diagnosed new onset systolic  CHF 2D echo done on 06/11/2020 showed LVEF 40 to 45% with left ventricle global hypokinesis Independent reviewed chest x-ray done today and previously which showed  mild increase in pulmonary vascularity suggestive of pulmonary edema She has bilateral pitting edema in lower extremities. Cardiology consulted to assist with the management. Holding of diuretics due to recent AKI, last creatinine improving 5.84 from 7.59. Strict I's and O's and daily weights Obtain lipid panel 06/22/20 hemoglobin A1c 12.9 on 06/11/2020. Started on coreg, hydralazine Avoiding Acei, ARBs, diuretics due to poor renal function  Abnormal stress test Leane Call 06/21/20 Not a good candidate for heart cath due to poor renal function at this time Management deferred to cardiology  Uncontrolled HTN Coreg dose increased  On hydralazine 10 mg po TID Management per cards  Mild pulmonary edema, suspect cardiogenic No diuretics given due to AKI. Start incentive spirometer and flutter valve Maintain O2 saturation greater than 92%.  Bilateral lower extremity edema Nontender on palpation Advised to elevate legs when sitting  Severe iron deficiency anemia Patient history of malignancy and has declined blood transfusion Last hemoglobin 5.2 Ongoing judicious blood draws Repeat CBC 06/22/2020  AKI on CKD stage 3b(suspectedbaselinehypertensive and diabetic nephropathy)/hyperkalemia,non anion gap metabolic acidosis/ ATN. Renal US with increased echogenicity both kidneys suggesting chronic renal disease, with right kidney atrophy. Serologies negative: GBM, ANA, PANCA, and complement C3 and C4 is high Cr downtrending as stated above Avoid nephrotoxins  Continue phoslo and oral serum bicarboante.  Continue to follow with nephrology recommendations in regards of timing for renal replacement therapy.  Repeat renal panel on 06/22/2020. Appreciate nephrology's assistance.  Uncontrolled T2DM Hgb with  hyperglycemia. Hemoglobin A1c 12.9 on 06/11/2020. Continue insulin sliding scale for glucose cover and monitoring. Diabetes coordinator following, appreciate assistance Continue withatorvastatin.   Obesity class 2.Calculated BMI is 39. Recommend weight loss outpatient with regular physical activity and healthy dieting.  Depression.Stable. Continue withariprazole and amitriptyline.  HTN. Blood pressure currently not at goal, defer to cardiology to start beta-blocker.  Intermittent tachycardia Obtain twelve-lead EKG Will benefit from beta-blocker  Physical debility PT to assess  Status is: Inpatient  Remains inpatient appropriate because:Inpatient level of care appropriate due to severity of illness   Dispo: The patient is from: Home  Anticipated d/c is to: Home  Anticipated d/c date is:06/24/20  Patient currently is not medically stable to d/c due to ongoing management of AKI and new onset systolic CHF.  DVT prophylaxis:      Heparin  SQ TID Code Status:              full  Family Communication:       No family at the bedside     Consultants:   Nephrology   Cardiology.  Antimicrobials:   Completed meropenem.            Objective: Vitals:   06/21/20 0913 06/21/20 0938 06/21/20 0940 06/21/20 0942  BP: (!) 141/78 135/73 (!) 146/86 (!) 151/91  Pulse:      Resp:      Temp:      TempSrc:      SpO2:      Weight:      Height:        Intake/Output Summary (Last 24 hours) at 06/21/2020 1321 Last data filed at 06/20/2020 1900 Gross per 24 hour  Intake --  Output 1100 ml  Net -1100 ml   Filed Weights   06/10/20 0922 06/11/20 0007  Weight: (!) 117.9 kg (!) 124.9 kg    Exam:  . General: 39 y.o. year-old female obese in no acute distress.  Alert and oriented x3. . Cardiovascular: Regular rate and rhythm no  rubs or gallops. Marland Kitchen Respiratory: Mild rales at bases no wheezing noted. . Abdomen: Obese nontender  normal bowel sounds present.  . Musculoskeletal: Lower extremity edema bilaterally. Marland Kitchen Psychiatry: Mood is appropriate for condition and setting.   Data Reviewed: CBC: Recent Labs  Lab 06/15/20 0433 06/16/20 0405 06/17/20 0338 06/18/20 1405  WBC 15.2* 11.6* 10.7* 11.9*  NEUTROABS 11.0* 8.3* 6.9 8.0*  HGB 6.5* 5.9* 5.5* 5.2*  HCT 20.1* 18.7* 17.7* 16.6*  MCV 91.4 90.8 90.8 91.7  PLT 362 339 358 956*   Basic Metabolic Panel: Recent Labs  Lab 06/15/20 0433 06/16/20 0405 06/17/20 0338 06/18/20 0114 06/20/20 0251  NA 137 134* 139 139 140  K 4.9 4.7 4.7 4.5 4.1  CL 102 101 103 105 104  CO2 23 21* 19* 19* 23  GLUCOSE 165* 247* 188* 166* 172*  BUN 57* 65* 70* 77* 76*  CREATININE 6.72* 7.25* 7.77* 7.59* 5.84*  CALCIUM 8.2* 7.9* 7.6* 7.8* 8.2*  PHOS  --   --  8.1* 8.2* 6.0*   GFR: Estimated Creatinine Clearance: 18.6 mL/min (A) (by C-G formula based on SCr of 5.84 mg/dL (H)). Liver Function Tests: Recent Labs  Lab 06/17/20 0338 06/18/20 0114 06/20/20 0251  ALBUMIN 1.3* 1.5* 1.5*   No results for input(s): LIPASE, AMYLASE in the last 168 hours. No results for input(s): AMMONIA in the last 168 hours. Coagulation Profile: No results for input(s): INR, PROTIME in the last 168 hours. Cardiac Enzymes: No results for input(s): CKTOTAL, CKMB, CKMBINDEX, TROPONINI in the last 168 hours. BNP (last 3 results) No results for input(s): PROBNP in the last 8760 hours. HbA1C: No results for input(s): HGBA1C in the last 72 hours. CBG: Recent Labs  Lab 06/20/20 1105 06/20/20 1611 06/20/20 2124 06/21/20 0615 06/21/20 1116  GLUCAP 234* 122* 135* 127* 189*   Lipid Profile: No results for input(s): CHOL, HDL, LDLCALC, TRIG, CHOLHDL, LDLDIRECT in the last 72 hours. Thyroid Function Tests: No results for input(s): TSH, T4TOTAL, FREET4, T3FREE, THYROIDAB in the last 72 hours. Anemia Panel: No results for input(s): VITAMINB12, FOLATE, FERRITIN, TIBC, IRON, RETICCTPCT in the last  72 hours. Urine analysis:    Component Value Date/Time   COLORURINE YELLOW 06/16/2020 1330   APPEARANCEUR HAZY (A) 06/16/2020 1330   LABSPEC 1.018 06/16/2020 1330   PHURINE 5.0 06/16/2020 1330   GLUCOSEU 150 (A) 06/16/2020 1330   HGBUR MODERATE (A) 06/16/2020 1330   HGBUR negative 12/03/2010 1046   BILIRUBINUR NEGATIVE 06/16/2020 1330   KETONESUR NEGATIVE 06/16/2020 1330   PROTEINUR >=300 (A) 06/16/2020 1330   UROBILINOGEN 0.2 03/19/2020 1853   NITRITE NEGATIVE 06/16/2020 1330   LEUKOCYTESUR SMALL (A) 06/16/2020 1330   Sepsis Labs: @LABRCNTIP (procalcitonin:4,lacticidven:4)  ) Recent Results (from the past 240 hour(s))  Culture, Urine     Status: None   Collection Time: 06/12/20  5:29 AM   Specimen: Urine, Clean Catch  Result Value Ref Range Status   Specimen Description URINE, CLEAN CATCH  Final   Special Requests NONE  Final   Culture   Final    NO GROWTH Performed at [facility], [place] [address] [place], [place] [address]    Report Status 06/13/2020 FINAL  Final      Studies: NM Myocar Multi W/Spect W/Wall Motion / EF  Result Date: 06/21/2020 CLINICAL DATA:  39 year old female with cardiomyopathy. EXAM: MYOCARDIAL IMAGING WITH SPECT (REST AND PHARMACOLOGIC-STRESS) GATED LEFT VENTRICULAR WALL MOTION STUDY LEFT VENTRICULAR EJECTION FRACTION TECHNIQUE: Standard myocardial SPECT imaging was performed after resting  intravenous injection of 10 mCi Tc-10m tetrofosmin. Subsequently, intravenous infusion of Lexiscan was performed under the supervision of the Cardiology staff. At peak effect of the drug, 30 mCi Tc-65m tetrofosmin was injected intravenously and standard myocardial SPECT imaging was performed. Quantitative gated imaging was also performed to evaluate left ventricular wall motion, and estimate left ventricular ejection fraction. COMPARISON:  None. FINDINGS: Perfusion: LEFT ventricular dilatation similar on rest and stress imaging. There are decreased counts within  the apical, mid, and basilar segments anterior septal wall which improved from stress to rest. There are decreased counts on rest imaging in this region however again there is improvement/reversibility. Wall Motion: Normal left ventricular wall motion. No left ventricular dilation. Left Ventricular Ejection Fraction: 48 % End diastolic volume 53 ml End systolic volume 50 ml IMPRESSION: 1. Moderate region of reversible ischemia within the anterior septal wall. 2. Normal left ventricular wall motion. LEFT ventricular dilatation. 3. Left ventricular ejection fraction 48% 4. Non invasive risk stratification*: High (risk stratification based on combination of ischemia, LEFT ventricular dilatation and low EF). *2012 Appropriate Use Criteria for Coronary Revascularization Focused Update: J Am Coll Cardiol. 1157;26(2):035-597. http://content.[email or web address].aspx?articleid=[email or web address] These results will be called to the ordering clinician or representative by the Radiologist Assistant, and communication documented in the PACS or [employer]. Electronically Signed   By: Suzy Bouchard M.D.   On: 06/21/2020 12:34    Scheduled Meds: . (feeding supplement) PROSource Plus  30 mL Oral BID BM  . amitriptyline  10 mg Oral QHS  . atorvastatin  20 mg Oral Daily  . calcium acetate  667 mg Oral TID WC  . carvedilol  6.25 mg Oral BID WC  . Chlorhexidine Gluconate Cloth  6 each Topical Daily  . darbepoetin (ARANESP) injection - NON-DIALYSIS  300 mcg Subcutaneous Q Mon-1800  . dextromethorphan-guaiFENesin  2 tablet Oral BID  . fluconazole  100 mg Oral Daily  . fluticasone  2 spray Each Nare Daily  . heparin  5,000 Units Subcutaneous Q8H  . hydrALAZINE  10 mg Oral Q8H  . insulin aspart  0-9 Units Subcutaneous TID WC  . insulin aspart  2 Units Subcutaneous TID WC  . insulin detemir  8 Units Subcutaneous QHS  . isosorbide mononitrate  15 mg Oral Daily  . metoCLOPramide (REGLAN) injection  5 mg Intravenous Q6H  .  multivitamin with minerals  1 tablet Oral Daily  . saccharomyces boulardii  250 mg Oral BID  . sodium bicarbonate  650 mg Oral BID    Continuous Infusions:   LOS: 11 days     Kayleen Memos, MD [facility] Pager [phone]  If 7PM-7AM, please contact night-coverage [email or web address] Password [email or web address] 06/21/2020, 1:21 PM

## 2020-06-22 DIAGNOSIS — R9439 Abnormal result of other cardiovascular function study: Secondary | ICD-10-CM

## 2020-06-22 DIAGNOSIS — I447 Left bundle-branch block, unspecified: Secondary | ICD-10-CM

## 2020-06-22 LAB — RENAL FUNCTION PANEL
Albumin: 1.5 g/dL — ABNORMAL LOW (ref 3.5–5.0)
Anion gap: 11 (ref 5–15)
BUN: 50 mg/dL — ABNORMAL HIGH (ref 6–20)
CO2: 24 mmol/L (ref 22–32)
Calcium: 8.1 mg/dL — ABNORMAL LOW (ref 8.9–10.3)
Chloride: 108 mmol/L (ref 98–111)
Creatinine, Ser: 3.21 mg/dL — ABNORMAL HIGH (ref 0.44–1.00)
GFR calc Af Amer: 20 mL/min — ABNORMAL LOW (ref >60)
GFR calc non Af Amer: 17 mL/min — ABNORMAL LOW (ref >60)
Glucose, Bld: 164 mg/dL — ABNORMAL HIGH (ref 70–99)
Phosphorus: 4.7 mg/dL — ABNORMAL HIGH (ref 2.5–4.6)
Potassium: 4.4 mmol/L (ref 3.5–5.1)
Sodium: 143 mmol/L (ref 135–145)

## 2020-06-22 LAB — CBC
HCT: 21.4 % — ABNORMAL LOW (ref 36.0–46.0)
Hemoglobin: 6.3 g/dL — CL (ref 12.0–15.0)
MCH: 28.9 pg (ref 26.0–34.0)
MCHC: 29.4 g/dL — ABNORMAL LOW (ref 30.0–36.0)
MCV: 98.2 fL (ref 80.0–100.0)
Platelets: 326 10*3/uL (ref 150–400)
RBC: 2.18 MIL/uL — ABNORMAL LOW (ref 3.87–5.11)
RDW: 15.2 % (ref 11.5–15.5)
WBC: 8.6 10*3/uL (ref 4.0–10.5)
nRBC: 1.3 % — ABNORMAL HIGH (ref 0.0–0.2)

## 2020-06-22 LAB — LIPID PANEL
Cholesterol: 87 mg/dL (ref 0–200)
HDL: 22 mg/dL — ABNORMAL LOW (ref >40)
LDL Cholesterol: 46 mg/dL (ref 0–99)
Total CHOL/HDL Ratio: 4 RATIO
Triglycerides: 95 mg/dL (ref <150)
VLDL: 19 mg/dL (ref 0–40)

## 2020-06-22 LAB — GLUCOSE, CAPILLARY
Glucose-Capillary: 156 mg/dL — ABNORMAL HIGH (ref 70–99)
Glucose-Capillary: 118 mg/dL — ABNORMAL HIGH (ref 70–99)
Glucose-Capillary: 164 mg/dL — ABNORMAL HIGH (ref 70–99)
Glucose-Capillary: 162 mg/dL — ABNORMAL HIGH (ref 70–99)

## 2020-06-22 LAB — TSH: TSH: 2.619 u[IU]/mL (ref 0.350–4.500)

## 2020-06-22 MED ORDER — FERROUS SULFATE 325 (65 FE) MG PO TABS
325.0000 mg | ORAL_TABLET | Freq: Two times a day (BID) | ORAL | Status: DC
  Administered 2020-06-22 – 2020-06-26 (×8): 325 mg via ORAL
  Filled 2020-06-22 (×8): qty 1

## 2020-06-22 NOTE — Progress Notes (Signed)
Progress Note  Patient Name: [name] Date of Encounter: 06/22/2020  [facility] Cardiologist: Fransico Him, MD   Subjective    Complains of more constant "chest squeezing". Myoview was abnormal, suggestive of anterior ischemia, however, renal function and anemia prohibit cath at this time. I explained this to her in detail today.  Inpatient Medications    Scheduled Meds: . (feeding supplement) PROSource Plus  30 mL Oral BID BM  . amitriptyline  10 mg Oral QHS  . atorvastatin  20 mg Oral Daily  . calcium acetate  667 mg Oral TID WC  . carvedilol  6.25 mg Oral BID WC  . Chlorhexidine Gluconate Cloth  6 each Topical Daily  . darbepoetin (ARANESP) injection - NON-DIALYSIS  300 mcg Subcutaneous Q Mon-1800  . ferrous sulfate  325 mg Oral BID WC  . fluticasone  2 spray Each Nare Daily  . heparin  5,000 Units Subcutaneous Q8H  . hydrALAZINE  10 mg Oral Q8H  . insulin aspart  0-9 Units Subcutaneous TID WC  . insulin aspart  2 Units Subcutaneous TID WC  . insulin detemir  8 Units Subcutaneous QHS  . isosorbide mononitrate  15 mg Oral Daily  . metoCLOPramide (REGLAN) injection  5 mg Intravenous Q6H  . multivitamin with minerals  1 tablet Oral Daily  . saccharomyces boulardii  250 mg Oral BID  . sodium bicarbonate  650 mg Oral BID   Continuous Infusions:  PRN Meds: acetaminophen **OR** acetaminophen, albuterol, fentaNYL (SUBLIMAZE) injection, ondansetron **OR** ondansetron (ZOFRAN) IV, polyethylene glycol   Vital Signs    Vitals:   06/21/20 1958 06/21/20 2353 06/22/20 0340 06/22/20 0906  BP: (!) 155/96 129/72 126/76 114/80  Pulse: 100  97   Resp: 20 20 18    Temp: 98.5 F (36.9 C) 98.4 F (36.9 C) 98.6 F (37 C)   TempSrc: Oral Oral Oral   SpO2: 100% 98% 95%   Weight:      Height:        Intake/Output Summary (Last 24 hours) at 06/22/2020 0934 Last data filed at 06/22/2020 0900 Gross per 24 hour  Intake --  Output 1000 ml  Net -1000 ml   Last 3 Weights  06/11/2020 06/10/2020 07/29/2019  Weight (lbs) 275 lb 5.7 oz 260 lb 118 lb  Weight (kg) 124.9 kg 117.935 kg 53.524 kg      Telemetry    Sinus rhythm, tachycardia - Personally Reviewed  ECG    N/A - Personally Reviewed  Physical Exam   GEN: Morbidly obese. No acute distress.   Neck: JVD difficult to assess due to body habitus. Cardiac: RRR. No murmurs, rubs, or gallops.  Respiratory: Clear to auscultation bilaterally. GI: Soft, obese, and non-tender. MS: Trace lower extremity edema. No deformity. Skin: Warm and dry. Neuro:  No focal deficits. Psych: Normal affect.  Labs    High Sensitivity Troponin:  No results for input(s): TROPONINIHS in the last 720 hours.    Chemistry Recent Labs  Lab 06/18/20 0114 06/20/20 0251 06/22/20 0603  NA 139 140 143  K 4.5 4.1 4.4  CL 105 104 108  CO2 19* 23 24  GLUCOSE 166* 172* 164*  BUN 77* 76* 50*  CREATININE 7.59* 5.84* 3.21*  CALCIUM 7.8* 8.2* 8.1*  ALBUMIN 1.5* 1.5* 1.5*  GFRNONAA 6* 8* 17*  GFRAA 7* 10* 20*  ANIONGAP 15 13 11      Hematology Recent Labs  Lab 06/17/20 0338 06/18/20 1405 06/22/20 0603  WBC 10.7* 11.9* 8.6  RBC 1.95* 1.81* 2.18*  HGB 5.5* 5.2* 6.3*  HCT 17.7* 16.6* 21.4*  MCV 90.8 91.7 98.2  MCH 28.2 28.7 28.9  MCHC 31.1 31.3 29.4*  RDW 12.8 12.9 15.2  PLT 358 410* 326    BNPNo results for input(s): BNP, PROBNP in the last 168 hours.   DDimer No results for input(s): DDIMER in the last 168 hours.   Radiology    NM Myocar Multi W/Spect W/Wall Motion / EF  Result Date: 06/21/2020 CLINICAL DATA:  39 year old female with cardiomyopathy. EXAM: MYOCARDIAL IMAGING WITH SPECT (REST AND PHARMACOLOGIC-STRESS) GATED LEFT VENTRICULAR WALL MOTION STUDY LEFT VENTRICULAR EJECTION FRACTION TECHNIQUE: Standard myocardial SPECT imaging was performed after resting intravenous injection of 10 mCi Tc-58m tetrofosmin. Subsequently, intravenous infusion of Lexiscan was performed under the supervision of the Cardiology  staff. At peak effect of the drug, 30 mCi Tc-59m tetrofosmin was injected intravenously and standard myocardial SPECT imaging was performed. Quantitative gated imaging was also performed to evaluate left ventricular wall motion, and estimate left ventricular ejection fraction. COMPARISON:  None. FINDINGS: Perfusion: LEFT ventricular dilatation similar on rest and stress imaging. There are decreased counts within the apical, mid, and basilar segments anterior septal wall which improved from stress to rest. There are decreased counts on rest imaging in this region however again there is improvement/reversibility. Wall Motion: Normal left ventricular wall motion. No left ventricular dilation. Left Ventricular Ejection Fraction: 48 % End diastolic volume 53 ml End systolic volume 50 ml IMPRESSION: 1. Moderate region of reversible ischemia within the anterior septal wall. 2. Normal left ventricular wall motion. LEFT ventricular dilatation. 3. Left ventricular ejection fraction 48% 4. Non invasive risk stratification*: High (risk stratification based on combination of ischemia, LEFT ventricular dilatation and low EF). *2012 Appropriate Use Criteria for Coronary Revascularization Focused Update: J Am Coll Cardiol. 1638;46(6):599-357. http://content.[email or web address].aspx?articleid=[email or web address] These results will be called to the ordering clinician or representative by the Radiologist Assistant, and communication documented in the PACS or [employer]. Electronically Signed   By: Suzy Bouchard M.D.   On: 06/21/2020 12:34    Cardiac Studies   Echocardiogram 06/11/2020: Impressions: 1. Left ventricular ejection fraction, by estimation, is 40 to 45%. The  left ventricle has mildly decreased function. The left ventricle  demonstrates global hypokinesis. Left ventricular diastolic parameters are  indeterminate.  2. Right ventricule is not well visualized but grossly normal size and  systolic function.  Tricuspid regurgitation signal is inadequate for  assessing PA pressure.  3. The mitral valve is normal in structure. Trivial mitral valve  regurgitation.  4. The aortic valve was not well visualized. Aortic valve regurgitation  is not visualized. No aortic stenosis is present.  5. The inferior vena cava is dilated in size with <50% respiratory  variability, suggesting right atrial pressure of 15 mmHg.   Patient Profile     39 y.o. female with a history of hypertension, type 2 diabetes, CKD stge III, DVT, recurrent UTIs, and morbid obesity who is being seen for evaluation of new dilated cardiomyopathy at the request of Dr. [place] Crane.  Assessment & Plan    New Onset Systolic CHF - Admitted with sepsis secondary to pyelonephritis with hypotension and worsening renal function.  - Echo showed LVEF of 40-45% with global hypokinesis, EKG shows LBBB - Net positive 3L this admission due to aggressive hydration for sepsis. - Does not appear significantly volume overloaded on exam but somewhat difficult to tell due to body habitus. - Started on Coreg 3.125mg  twice daily and Hydralazine  10mg  TID yesterday. - BP still elevated. Consider increasing Coreg to 6.25mg  twice daily and adding Imdur. - No ACEI/ARB/ARNI due to CKD. - TSH, SPEP, UPEP pending. - Continue to monitor daily weight, strict I/O's, and renal function. - Myoview stress test shows significant reversible anterior and anteroseptal ischemia - I agree with interpretation, this is less likely to be LBBB artifact and more likely to be due to LAD territory ischemia. Unfortunately, not a cath candidate unless renal function and anemia improve and stabilize.  Hypertension - BP improved. - Will increase Coreg to 6.25mg  twice daily.  - Continue Hydralazine as above.  Uncontrolled Type 2 Diabetes Mellitus - Management per primary team.  Severe Sepsis/ Acute Pyelonephritis  - Complicated by hypotension and AKI. - Secondary to resistant  Klebsiella ESBL infection - Per primary team/Nephrology.  AKI on CKD Stage IIIb - Creatinine 5.84 yesterday, now improved to 3.21 - Felt to be secondary to hypertensive and diabetic nephropathy. - Management per Nephrology.  Severe Anemia - Hemoglobin 5.2 - improved to 6.3 - Patient a [name] and declines blood products.  - Management per primary team.  For questions or updates, please contact [place] Please consult [email or web address] for contact info under   Pixie Casino, MD, FACC, [place] Director of the [facility]  [facility] Diplomate of the American Board of Clinical Lipidology Attending Cardiologist  Direct Dial: [phone]  Fax: [phone]  Website:  [email or web address]  Pixie Casino, MD  06/22/2020, 9:34 AM

## 2020-06-22 NOTE — Progress Notes (Signed)
[name] Progress Note   39 year old BF with DM, obesity, frequent UTIs and strong familly history of ESRD. She presents with pyelonephritis and A on CRF  Assessment/ Plan:     1.Renal-baseline crt appears over 2. Due to having one atrophic kidney and DM. Longstanding history of proteinuria argues for a chronic issue. Now with A on CRF in the setting of a UTI/ Possible pyelo and some hemodynamic changes that could be reversible. There have been no absolute indications for dialysis. Had nausea that predated admission and  her albumin is 1.3-  She has nephrotic range proteinuria but not that severe- so thought to be more c/w  malnourishment from recent illness.    -Given the fact that she was failing to thrive of sorts I had planned to start HD.  However, then she was symptomatically improved-  I struggled with doing an invasive procedure ( HD cath)  for kidney numbers that are not severe that could throw her into crisis from anemia.  She will not be talked into transfusion.  - Crt has fortunately continued to trend  down,   May be trying to turn the corner, non oliguric.  Will sign off at this time; please reconsult as needed.  2. Hypertension/volume- BP is adequate-  Only on clonidine as OP- Currently on hold-  3. Anemia- hgb under 6- Refusing transfusion- Iron sat is low- ferritin is high - have dosed with iron on 8/2  and 8/5- Also gave ESA- darbe 300 per week-  On Monday 4. [place]-Phos is high- Has been started on phoslo- Ok to continue but need to monitor Phos every few days as with improvement of renal function may need to stop.  5. Nausea-  On prn zofran and reglan which has improved the situation so less convinced that she is actually uremic-  So holding off on starting HD  6.  UTI/pyelo-  On meropenam, finished course-  Culture was actually negative- recheck of U/A still appeared to show pyuria, now with yeast-  Foley d/c'd and started  diflucan course 7.  Cards-  So pretty sure stress test will be positive with hgb in 5's.  Not sure what to do with that information-  Unless we really think is ischemic would advise against cardiac cath in the short term due to advanced CKD    Subjective:   Feeling rough but overall breathing a little bit better; denies f/c/n/v.   Objective:   BP 114/80 (BP Location: Left Arm)   Pulse 97   Temp 98.6 F (37 C) (Oral)   Resp 18   Ht 5\' 10"  (1.778 m)   Wt (!) 124.9 kg   SpO2 95%   BMI 39.51 kg/m   Intake/Output Summary (Last 24 hours) at 06/22/2020 1121 Last data filed at 06/22/2020 0900 Gross per 24 hour  Intake --  Output 1000 ml  Net -1000 ml   Weight change:   Physical Exam: General:  Some SOB Heart: RRR Lungs: mostly clear Abdomen: obese, soft, non tender Extremities: edema prsent  Imaging: NM Myocar Multi W/Spect W/Wall Motion / EF  Result Date: 06/21/2020 CLINICAL DATA:  39 year old female with cardiomyopathy. EXAM: MYOCARDIAL IMAGING WITH SPECT (REST AND PHARMACOLOGIC-STRESS) GATED LEFT VENTRICULAR WALL MOTION STUDY LEFT VENTRICULAR EJECTION FRACTION TECHNIQUE: Standard myocardial SPECT imaging was performed after resting intravenous injection of 10 mCi Tc-38m tetrofosmin. Subsequently, intravenous infusion of Lexiscan was performed under the supervision of the Cardiology staff. At peak effect of the drug, 30 mCi  Tc-76m tetrofosmin was injected intravenously and standard myocardial SPECT imaging was performed. Quantitative gated imaging was also performed to evaluate left ventricular wall motion, and estimate left ventricular ejection fraction. COMPARISON:  None. FINDINGS: Perfusion: LEFT ventricular dilatation similar on rest and stress imaging. There are decreased counts within the apical, mid, and basilar segments anterior septal wall which improved from stress to rest. There are decreased counts on rest imaging in this region however again there is  improvement/reversibility. Wall Motion: Normal left ventricular wall motion. No left ventricular dilation. Left Ventricular Ejection Fraction: 48 % End diastolic volume 53 ml End systolic volume 50 ml IMPRESSION: 1. Moderate region of reversible ischemia within the anterior septal wall. 2. Normal left ventricular wall motion. LEFT ventricular dilatation. 3. Left ventricular ejection fraction 48% 4. Non invasive risk stratification*: High (risk stratification based on combination of ischemia, LEFT ventricular dilatation and low EF). *2012 Appropriate Use Criteria for Coronary Revascularization Focused Update: J Am Coll Cardiol. 1856;31(4):970-263. http://content.airportbarriers.com.aspx?articleid=1201161 These results will be called to the ordering clinician or representative by the Radiologist Assistant, and communication documented in the PACS or [employer]. Electronically Signed   By: Suzy Bouchard M.D.   On: 06/21/2020 12:34    Labs: BMET Recent Labs  Lab 06/16/20 0405 06/17/20 0338 06/18/20 0114 06/20/20 0251 06/22/20 0603  NA 134* 139 139 140 143  K 4.7 4.7 4.5 4.1 4.4  CL 101 103 105 104 108  CO2 21* 19* 19* 23 24  GLUCOSE 247* 188* 166* 172* 164*  BUN 65* 70* 77* 76* 50*  CREATININE 7.25* 7.77* 7.59* 5.84* 3.21*  CALCIUM 7.9* 7.6* 7.8* 8.2* 8.1*  PHOS  --  8.1* 8.2* 6.0* 4.7*   CBC Recent Labs  Lab 06/16/20 0405 06/17/20 0338 06/18/20 1405 06/22/20 0603  WBC 11.6* 10.7* 11.9* 8.6  NEUTROABS 8.3* 6.9 8.0*  --   HGB 5.9* 5.5* 5.2* 6.3*  HCT 18.7* 17.7* 16.6* 21.4*  MCV 90.8 90.8 91.7 98.2  PLT 339 358 410* 326    Medications:    . (feeding supplement) PROSource Plus  30 mL Oral BID BM  . amitriptyline  10 mg Oral QHS  . atorvastatin  20 mg Oral Daily  . calcium acetate  667 mg Oral TID WC  . carvedilol  6.25 mg Oral BID WC  . Chlorhexidine Gluconate Cloth  6 each Topical Daily  . darbepoetin (ARANESP) injection - NON-DIALYSIS  300 mcg Subcutaneous Q  Mon-1800  . ferrous sulfate  325 mg Oral BID WC  . fluticasone  2 spray Each Nare Daily  . heparin  5,000 Units Subcutaneous Q8H  . hydrALAZINE  10 mg Oral Q8H  . insulin aspart  0-9 Units Subcutaneous TID WC  . insulin aspart  2 Units Subcutaneous TID WC  . insulin detemir  8 Units Subcutaneous QHS  . isosorbide mononitrate  15 mg Oral Daily  . metoCLOPramide (REGLAN) injection  5 mg Intravenous Q6H  . multivitamin with minerals  1 tablet Oral Daily  . saccharomyces boulardii  250 mg Oral BID  . sodium bicarbonate  650 mg Oral BID      Otelia Santee, MD 06/22/2020, 11:21 AM

## 2020-06-22 NOTE — Progress Notes (Signed)
PROGRESS NOTE  [name] XTG:[id number] DOB: December 08, 1980 DOA: 06/10/2020 PCP: Charlott Rakes, MD  HPI/Recap of past 24 hours: Patient admitted to the hospital with a working diagnosis ofseveresepsis due to acute pyelonephritis,end-organ failure hypotensionand AKIpresent on admission.Now with worsening renal function and worsening anemia (Jehova witness).  39 year old female with past medical history for type 2 diabetes mellitus, recurrent urinary tract infections (ESBL Klebsiella), hypertension, CKD stage 3b,history of deep vein thrombosis and obesityclass 2. Patient reported 3 days of lower abdominal pain, associated with nausea and vomiting, no frank dysuria. On July 26 she came to the emergency department but she left without being seen. On her initial physical examination she was febrile 39.1 C, blood pressure 135/69, heart rate 111, respiratory rate 13, oxygen saturation 100%. Her lungswereclear to auscultation bilaterally, heart S1-S2, present rhythmic, her abdomen was soft, tender at the suprapubic region, no guarding or rebound, no lower extremity edema. Sodium 133, potassium 4.3, chloride 99, bicarb 22, glucose 508, BUN 34, creatinine 4.0, lipase 23, AST 14, ALT 16, white count 16.8, hemoglobin 8.5, hematocrit 26.2, platelets 302. SARS COVID-19 negative. Urinalysis >500 glucose, >300 protein, specific gravity 1.017, 21-50 red cells,>50 white cells. CT of the abdomen and pelvis showed right-sided perinephric stranding and mild inflammatory changes around the bladder. Chest radiograph with hilar vascular congestion, right base atelectasis, chronic left base scarring. EKG, 104bpm, left axis deviation, left bundle branch block, sinus rhythm, Q wave V1-V2, poor R wave progression, no significant ST segment changes, lead I/aVL T wave versions.   Patient has been placed on meropenem for antibiotic therapy and aggressive fluid resuscitation for  hypotension.  Patient developed oliguric renal failure, likely related to acute ATN due to hypotension.  Echocardiogram with signs of sepsis induced cardiomyopathy with reduction in LV systolic function and global hypokinesis.  Patient developingearlysigns of acutepulmonary edema, due to volume overload. Did not respond to IV furosemide.  Continue to decrease GFR per serum cr, her Hgb also continue to droop (anemia on chronic renal failure). She has been declining PRBC transfusion due to [name].  Plan for renal replacement therapy, likely this hospitalization.  Patient with worsening renal function, mild signs of volume overload. Patient will likely need renal replacement therapy on this hospitalization.  Ideally anemia need to be corrected in order to make HD catheter placement and renal replacement more safe. She continues to decline PRBC transfusion, despite knowing that there is a risk of bleeding while placing HD catheter. She understands that worsening anemia can further compromise vital organs and provoke death.    Seen by cardiology due to new onset CHF. Lexiscan myoview stress test on 06/21/20.   06/22/20: Feels better today. Cr down trending. Hg improving post IV Feraheme.     Assessment/Plan: Principal Problem:   Severe sepsis (HCC) Active Problems:   DM (diabetes mellitus), type 2, uncontrolled ([place])   Obesity (BMI 30-39.9)   Anxiety   Depression   HTN (hypertension)   Migraines   Acute pyelonephritis   AKI (acute kidney injury) ([place])   CKD (chronic kidney disease) stage 3, GFR 30-59 ml/min  Severe sepsis, improving, due to acute right pyelonephritis, endorgan failure hypotensionand AKI(present on admission). Suspected multidrug resistant Klebsiella ESBL infection.Current cultures no growth, buturineculture from 07/06 positive for ESBL Klebsiella. Completed antibiotic therapy with meropenem. Per nephro now with vagina candidiasis, treated  with Diflucan.    Newly diagnosed new onset systolic CHF 2D echo done on 06/11/2020 showed LVEF 40 to 45% with left ventricle global hypokinesis  Independent reviewed chest x-ray done today and previously which showed mild increase in pulmonary vascularity suggestive of pulmonary edema She has bilateral pitting edema in lower extremities. Cardiology consulted to assist with the management. Holding of diuretics due to recent AKI, last creatinine improving 5.84 from 7.59. Strict I's and O's and daily weights Obtain lipid panel 06/22/20 hemoglobin A1c 12.9 on 06/11/2020. Started on coreg, hydralazine Avoiding Acei, ARBs, diuretics due to poor renal function  AKI on CKD stage 3b(suspectedbaselinehypertensive and diabetic nephropathy)/hyperkalemia,non anion gap metabolic acidosis/ ATN. Renal US with increased echogenicity both kidneys suggesting chronic renal disease, with right kidney atrophy. Serologies negative: GBM, ANA, PANCA, and complement C3 and C4 is high Cr downtrending as stated above Avoid nephrotoxins  Continue phoslo and oral serum bicarboante.  Continue to follow with nephrology recommendations in regards of timing for renal replacement therapy.  Repeat renal panel on 06/22/2020, creatinine is downtrending, 3.2 with GFR of 20. Creatinine peaked at greater than 7.0 Appreciate nephrology's assistance.  Abnormal stress test Leane Call 06/21/20 Not a good candidate for heart cath due to poor renal function at this time Management deferred to cardiology  Severe iron deficiency anemia Patient history of malignancy and has declined blood transfusion Hemoglobin 5.2> 6.3. Received IV Feraheme Start p.o. ferrous sulfate 325 mg twice daily Continue judicious blood draws.  Resolved Uncontrolled HTN Coreg dose increased  On hydralazine 10 mg po TID Management per cards Appreciate assistance  Mild pulmonary edema, suspect cardiogenic No diuretics given due to AKI. Start  incentive spirometer and flutter valve Maintain O2 saturation greater than 92%.  Bilateral lower extremity edema Nontender on palpation Advised to elevate legs when sitting  Uncontrolled T2DM Hgb with hyperglycemia. Hemoglobin A1c 12.9 on 06/11/2020. Continue insulin sliding scale for glucose cover and monitoring. Diabetes coordinator following, appreciate assistance Continue withatorvastatin.   Obesity class 2.Calculated BMI is 39. Recommend weight loss outpatient with regular physical activity and healthy dieting.  Depression.Stable. Continue withariprazole and amitriptyline.  HTN. Blood pressure currently not at goal, defer to cardiology to start beta-blocker.  Intermittent tachycardia Obtain twelve-lead EKG Will benefit from beta-blocker  Physical debility/deconditioning Advised to mobilize as tolerated  Status is: Inpatient  Remains inpatient appropriate because:Inpatient level of care appropriate due to severity of illness   Dispo: The patient is from: Home  Anticipated d/c is to: Home  Anticipated d/c date is:06/24/20  Patient currently is not medically stable to d/c due to ongoing management of AKI and new onset systolic CHF.  DVT prophylaxis:      Heparin  SQ TID Code Status:              full  Family Communication:       No family at the bedside     Consultants:   Nephrology   Cardiology.  Antimicrobials:   Completed meropenem.            Objective: Vitals:   06/21/20 2353 06/22/20 0340 06/22/20 0906 06/22/20 1221  BP: 129/72 126/76 114/80 117/75  Pulse:  97  90  Resp: 20 18    Temp: 98.4 F (36.9 C) 98.6 F (37 C)  98.3 F (36.8 C)  TempSrc: Oral Oral  Oral  SpO2: 98% 95%  98%  Weight:      Height:        Intake/Output Summary (Last 24 hours) at 06/22/2020 1351 Last data filed at 06/22/2020 1223 Gross per 24 hour  Intake --  Output 1400 ml  Net -1400 ml   Filed  Weights    06/10/20 0922 06/11/20 0007  Weight: (!) 117.9 kg (!) 124.9 kg    Exam:  . General: 39 y.o. year-old female pleasant obese in no acute distress. Alert and oriented x3.  . Cardiovascular: Regular rate and rhythm no rubs or gallops.  Marland Kitchen Respiratory: Clear to auscultation no wheezes or rales.  . Abdomen: Obese nontender normal bowel sounds present.  . Musculoskeletal: Trace extremity edema bilaterally.  Marland Kitchen Psychiatry: Mood is appropriate for condition and setting.  Data Reviewed: CBC: Recent Labs  Lab 06/16/20 0405 06/17/20 0338 06/18/20 1405 06/22/20 0603  WBC 11.6* 10.7* 11.9* 8.6  NEUTROABS 8.3* 6.9 8.0*  --   HGB 5.9* 5.5* 5.2* 6.3*  HCT 18.7* 17.7* 16.6* 21.4*  MCV 90.8 90.8 91.7 98.2  PLT 339 358 410* 096   Basic Metabolic Panel: Recent Labs  Lab 06/16/20 0405 06/17/20 0338 06/18/20 0114 06/20/20 0251 06/22/20 0603  NA 134* 139 139 140 143  K 4.7 4.7 4.5 4.1 4.4  CL 101 103 105 104 108  CO2 21* 19* 19* 23 24  GLUCOSE 247* 188* 166* 172* 164*  BUN 65* 70* 77* 76* 50*  CREATININE 7.25* 7.77* 7.59* 5.84* 3.21*  CALCIUM 7.9* 7.6* 7.8* 8.2* 8.1*  PHOS  --  8.1* 8.2* 6.0* 4.7*   GFR: Estimated Creatinine Clearance: 33.8 mL/min (A) (by C-G formula based on SCr of 3.21 mg/dL (H)). Liver Function Tests: Recent Labs  Lab 06/17/20 0338 06/18/20 0114 06/20/20 0251 06/22/20 0603  ALBUMIN 1.3* 1.5* 1.5* 1.5*   No results for input(s): LIPASE, AMYLASE in the last 168 hours. No results for input(s): AMMONIA in the last 168 hours. Coagulation Profile: No results for input(s): INR, PROTIME in the last 168 hours. Cardiac Enzymes: No results for input(s): CKTOTAL, CKMB, CKMBINDEX, TROPONINI in the last 168 hours. BNP (last 3 results) No results for input(s): PROBNP in the last 8760 hours. HbA1C: No results for input(s): HGBA1C in the last 72 hours. CBG: Recent Labs  Lab 06/21/20 1116 06/21/20 1619 06/21/20 2217 06/22/20 0638 06/22/20 1218  GLUCAP 189* 196* 178*  156* 118*   Lipid Profile: Recent Labs    06/22/20 0603  CHOL 87  HDL 22*  LDLCALC 46  TRIG 95  CHOLHDL 4.0   Thyroid Function Tests: Recent Labs    06/22/20 0603  TSH 2.619   Anemia Panel: No results for input(s): VITAMINB12, FOLATE, FERRITIN, TIBC, IRON, RETICCTPCT in the last 72 hours. Urine analysis:    Component Value Date/Time   COLORURINE YELLOW 06/16/2020 1330   APPEARANCEUR HAZY (A) 06/16/2020 1330   LABSPEC 1.018 06/16/2020 1330   PHURINE 5.0 06/16/2020 1330   GLUCOSEU 150 (A) 06/16/2020 1330   HGBUR MODERATE (A) 06/16/2020 1330   HGBUR negative 12/03/2010 1046   BILIRUBINUR NEGATIVE 06/16/2020 1330   KETONESUR NEGATIVE 06/16/2020 1330   PROTEINUR >=300 (A) 06/16/2020 1330   UROBILINOGEN 0.2 03/19/2020 1853   NITRITE NEGATIVE 06/16/2020 1330   LEUKOCYTESUR SMALL (A) 06/16/2020 1330   Sepsis Labs: @LABRCNTIP (procalcitonin:4,lacticidven:4)  ) No results found for this or any previous visit (from the past 240 hour(s)).    Studies: No results found.  Scheduled Meds: . (feeding supplement) PROSource Plus  30 mL Oral BID BM  . amitriptyline  10 mg Oral QHS  . atorvastatin  20 mg Oral Daily  . calcium acetate  667 mg Oral TID WC  . carvedilol  6.25 mg Oral BID WC  . Chlorhexidine Gluconate Cloth  6 each Topical Daily  .  darbepoetin (ARANESP) injection - NON-DIALYSIS  300 mcg Subcutaneous Q Mon-1800  . ferrous sulfate  325 mg Oral BID WC  . fluticasone  2 spray Each Nare Daily  . heparin  5,000 Units Subcutaneous Q8H  . hydrALAZINE  10 mg Oral Q8H  . insulin aspart  0-9 Units Subcutaneous TID WC  . insulin aspart  2 Units Subcutaneous TID WC  . insulin detemir  8 Units Subcutaneous QHS  . isosorbide mononitrate  15 mg Oral Daily  . metoCLOPramide (REGLAN) injection  5 mg Intravenous Q6H  . multivitamin with minerals  1 tablet Oral Daily  . saccharomyces boulardii  250 mg Oral BID  . sodium bicarbonate  650 mg Oral BID    Continuous Infusions:    LOS: 12 days     Kayleen Memos, MD [facility] Pager [phone]  If 7PM-7AM, please contact night-coverage [email or web address] Password [email or web address] 06/22/2020, 1:51 PM

## 2020-06-23 DIAGNOSIS — I214 Non-ST elevation (NSTEMI) myocardial infarction: Secondary | ICD-10-CM

## 2020-06-23 LAB — PROTEIN ELECTROPHORESIS, SERUM
A/G Ratio: 0.4 — ABNORMAL LOW (ref 0.7–1.7)
Albumin ELP: 1.9 g/dL — ABNORMAL LOW (ref 2.9–4.4)
Alpha-1-Globulin: 0.4 g/dL (ref 0.0–0.4)
Alpha-2-Globulin: 1.3 g/dL — ABNORMAL HIGH (ref 0.4–1.0)
Beta Globulin: 1.1 g/dL (ref 0.7–1.3)
Gamma Globulin: 2.1 g/dL — ABNORMAL HIGH (ref 0.4–1.8)
Globulin, Total: 4.9 g/dL — ABNORMAL HIGH (ref 2.2–3.9)
Total Protein ELP: 6.8 g/dL (ref 6.0–8.5)

## 2020-06-23 LAB — GLUCOSE, CAPILLARY
Glucose-Capillary: 151 mg/dL — ABNORMAL HIGH (ref 70–99)
Glucose-Capillary: 215 mg/dL — ABNORMAL HIGH (ref 70–99)
Glucose-Capillary: 120 mg/dL — ABNORMAL HIGH (ref 70–99)

## 2020-06-23 MED ORDER — CARVEDILOL 6.25 MG PO TABS
9.7500 mg | ORAL_TABLET | Freq: Two times a day (BID) | ORAL | Status: DC
  Administered 2020-06-23 – 2020-06-26 (×7): 9.375 mg via ORAL
  Filled 2020-06-23 (×8): qty 1

## 2020-06-23 MED ORDER — ISOSORBIDE MONONITRATE ER 30 MG PO TB24
30.0000 mg | ORAL_TABLET | Freq: Every day | ORAL | Status: DC
  Administered 2020-06-23 – 2020-06-26 (×4): 30 mg via ORAL
  Filled 2020-06-23 (×4): qty 1

## 2020-06-23 NOTE — Progress Notes (Signed)
Physical Therapy Treatment Patient Details Name: [name] MRN: [id number] DOB: 05-07-1981 Today's Date: 06/23/2020    History of Present Illness Pt adm with severs sepsis due to rt pyelonephritis. Pt also with AKI on CKD and sepsis induced cardiomyopathy. PMH - recurrent UTIs, obesity, DM, anxiety/depression, htn, migraines.    PT Comments    Patient received sitting at EOB, very pleasant today but reporting dizziness- orthostatics check performed and VSS without orthostatic BP drop. Tolerated significant progression in mobility today, able to gait train 165f, 10110f then 20028fith no device/min guard, RPE and dyspnea no more than 3-4/10. Did require 2 standing rest breaks in hall but VSS on 3LPM O2. Education provided on seated HEP, also signs/symptoms of low Hgb/HCT to monitor and base levels of activity/safety concerns off of. Left sitting at EOB with all needs met, RN present and attending.     Follow Up Recommendations  No PT follow up     Equipment Recommendations  None recommended by PT    Recommendations for Other Services       Precautions / Restrictions Precautions Precautions: None Restrictions Weight Bearing Restrictions: No    Mobility  Bed Mobility               General bed mobility comments: Pt sitting on EOB  Transfers Overall transfer level: Independent Equipment used: None             General transfer comment: no difficulty with functional transfers, no device  Ambulation/Gait Ambulation/Gait assistance: Min guard Gait Distance (Feet): 400 Feet (100f22f, then 200ft66fsistive device: None Gait Pattern/deviations: WFL(Within Functional Limits);Step-through pattern Gait velocity: decr   General Gait Details: initially needed cues to slow gait speed in hallway as she was walking very fast, RPE only 3-4/10 and dyspnea 3/4 at worst. SPO2 no lower than 96% on 3LPM O2, VSS otherwise.   Stairs             Wheelchair  Mobility    Modified Rankin (Stroke Patients Only)       Balance Overall balance assessment: No apparent balance deficits (not formally assessed)                                          Cognition Arousal/Alertness: Awake/alert Behavior During Therapy: WFL for tasks assessed/performed Overall Cognitive Status: Within Functional Limits for tasks assessed                                        Exercises      General Comments        Pertinent Vitals/Pain Pain Assessment: No/denies pain    Home Living                      Prior Function            PT Goals (current goals can now be found in the care plan section) Acute Rehab PT Goals Patient Stated Goal: get better PT Goal Formulation: With patient Time For Goal Achievement: 06/27/20 Potential to Achieve Goals: Good Progress towards PT goals: Progressing toward goals    Frequency    Min 3X/week      PT Plan Current plan remains appropriate    Co-evaluation  AM-PAC PT "6 Clicks" Mobility   Outcome Measure  Help needed turning from your back to your side while in a flat bed without using bedrails?: None Help needed moving from lying on your back to sitting on the side of a flat bed without using bedrails?: None Help needed moving to and from a bed to a chair (including a wheelchair)?: None Help needed standing up from a chair using your arms (e.g., wheelchair or bedside chair)?: None Help needed to walk in hospital room?: None Help needed climbing 3-5 steps with a railing? : A Little 6 Click Score: 23    End of Session Equipment Utilized During Treatment: Oxygen Activity Tolerance: Patient limited by fatigue Patient left: in bed;with call bell/phone within reach;with nursing/sitter in room (sitting at EOB per her preference with RN present) Nurse Communication: Mobility status PT Visit Diagnosis: Other abnormalities of gait and mobility  (R26.89)     Time: 4734-0370 PT Time Calculation (min) (ACUTE ONLY): 24 min  Charges:  $Gait Training: 8-22 mins $Self Care/Home Management: 8-22                     Windell Norfolk, DPT, PN1   Supplemental Physical Therapist [place]    Pager [phone] Acute Rehab Office [phone]

## 2020-06-23 NOTE — Progress Notes (Signed)
Progress Note  Patient Name: [name] Date of Encounter: 06/23/2020  [facility] Cardiologist: Fransico Him, MD   Subjective   Still w/ mild chest squeezing, gets worse w/ exertion. Gets light-headed walking around in the room w/out O2  Inpatient Medications    Scheduled Meds: . (feeding supplement) PROSource Plus  30 mL Oral BID BM  . amitriptyline  10 mg Oral QHS  . atorvastatin  20 mg Oral Daily  . calcium acetate  667 mg Oral TID WC  . carvedilol  6.25 mg Oral BID WC  . Chlorhexidine Gluconate Cloth  6 each Topical Daily  . darbepoetin (ARANESP) injection - NON-DIALYSIS  300 mcg Subcutaneous Q Mon-1800  . ferrous sulfate  325 mg Oral BID WC  . fluticasone  2 spray Each Nare Daily  . heparin  5,000 Units Subcutaneous Q8H  . hydrALAZINE  10 mg Oral Q8H  . insulin aspart  0-9 Units Subcutaneous TID WC  . insulin aspart  2 Units Subcutaneous TID WC  . insulin detemir  8 Units Subcutaneous QHS  . isosorbide mononitrate  15 mg Oral Daily  . metoCLOPramide (REGLAN) injection  5 mg Intravenous Q6H  . multivitamin with minerals  1 tablet Oral Daily  . saccharomyces boulardii  250 mg Oral BID  . sodium bicarbonate  650 mg Oral BID   Continuous Infusions:  PRN Meds: acetaminophen **OR** acetaminophen, albuterol, fentaNYL (SUBLIMAZE) injection, ondansetron **OR** ondansetron (ZOFRAN) IV, polyethylene glycol   Vital Signs    Vitals:   06/22/20 1937 06/23/20 0036 06/23/20 0508 06/23/20 0745  BP: 117/78 (!) 103/56 115/68 137/79  Pulse: 89 90 97 95  Resp: 18 16 18 17   Temp: 98.2 F (36.8 C) 98.2 F (36.8 C) 98.7 F (37.1 C) 98.3 F (36.8 C)  TempSrc: Oral Oral Oral Oral  SpO2: 98% 98% 99% 100%  Weight:      Height:        Intake/Output Summary (Last 24 hours) at 06/23/2020 0846 Last data filed at 06/23/2020 7062 Gross per 24 hour  Intake --  Output 2000 ml  Net -2000 ml   Last 3 Weights 06/11/2020 06/10/2020 07/29/2019  Weight (lbs) 275 lb 5.7 oz  260 lb 118 lb  Weight (kg) 124.9 kg 117.935 kg 53.524 kg      Telemetry    High-nl SR, ST - Personally Reviewed  ECG    N/A - Personally Reviewed  Physical Exam   GEN: No acute distress.   Neck: No JVD seen, difficult to assess 2nd body habitus Cardiac: RRR, no murmur, no rubs, or gallops.  Respiratory: diminished to auscultation bilaterally (1000 cc on incentive spirometry). GI: Soft, nontender, non-distended  MS: trace LE edema; No deformity. Neuro:  Nonfocal  Psych: Normal affect   Labs    High Sensitivity Troponin:  No results for input(s): TROPONINIHS in the last 720 hours.    Chemistry Recent Labs  Lab 06/18/20 0114 06/20/20 0251 06/22/20 0603  NA 139 140 143  K 4.5 4.1 4.4  CL 105 104 108  CO2 19* 23 24  GLUCOSE 166* 172* 164*  BUN 77* 76* 50*  CREATININE 7.59* 5.84* 3.21*  CALCIUM 7.8* 8.2* 8.1*  ALBUMIN 1.5* 1.5* 1.5*  GFRNONAA 6* 8* 17*  GFRAA 7* 10* 20*  ANIONGAP 15 13 11      Hematology Recent Labs  Lab 06/17/20 0338 06/18/20 1405 06/22/20 0603  WBC 10.7* 11.9* 8.6  RBC 1.95* 1.81* 2.18*  HGB 5.5* 5.2* 6.3*  HCT  17.7* 16.6* 21.4*  MCV 90.8 91.7 98.2  MCH 28.2 28.7 28.9  MCHC 31.1 31.3 29.4*  RDW 12.8 12.9 15.2  PLT 358 410* 326   Lab Results  Component Value Date   ALT 16 06/10/2020   AST 14 (L) 06/10/2020   ALKPHOS 86 06/10/2020   BILITOT 0.5 06/10/2020   Lab Results  Component Value Date   CHOL 87 06/22/2020   HDL 22 (L) 06/22/2020   LDLCALC 46 06/22/2020   TRIG 95 06/22/2020   CHOLHDL 4.0 06/22/2020   Lab Results  Component Value Date   TSH 2.619 06/22/2020   Lab Results  Component Value Date   IRON 24 (L) 06/14/2020   TIBC 104 (L) 06/14/2020   FERRITIN 1,544 (H) 06/14/2020   BNPNo results for input(s): BNP, PROBNP in the last 168 hours.   DDimer No results for input(s): DDIMER in the last 168 hours.   Radiology    NM Myocar Multi W/Spect W/Wall Motion / EF  Result Date: 06/21/2020 CLINICAL DATA:  39 year old  female with cardiomyopathy. EXAM: MYOCARDIAL IMAGING WITH SPECT (REST AND PHARMACOLOGIC-STRESS) GATED LEFT VENTRICULAR WALL MOTION STUDY LEFT VENTRICULAR EJECTION FRACTION TECHNIQUE: Standard myocardial SPECT imaging was performed after resting intravenous injection of 10 mCi Tc-9m tetrofosmin. Subsequently, intravenous infusion of Lexiscan was performed under the supervision of the Cardiology staff. At peak effect of the drug, 30 mCi Tc-58m tetrofosmin was injected intravenously and standard myocardial SPECT imaging was performed. Quantitative gated imaging was also performed to evaluate left ventricular wall motion, and estimate left ventricular ejection fraction. COMPARISON:  None. FINDINGS: Perfusion: LEFT ventricular dilatation similar on rest and stress imaging. There are decreased counts within the apical, mid, and basilar segments anterior septal wall which improved from stress to rest. There are decreased counts on rest imaging in this region however again there is improvement/reversibility. Wall Motion: Normal left ventricular wall motion. No left ventricular dilation. Left Ventricular Ejection Fraction: 48 % End diastolic volume 53 ml End systolic volume 50 ml IMPRESSION: 1. Moderate region of reversible ischemia within the anterior septal wall. 2. Normal left ventricular wall motion. LEFT ventricular dilatation. 3. Left ventricular ejection fraction 48% 4. Non invasive risk stratification*: High (risk stratification based on combination of ischemia, LEFT ventricular dilatation and low EF). *2012 Appropriate Use Criteria for Coronary Revascularization Focused Update: J Am Coll Cardiol. 1749;44(9):675-916. http://content.airportbarriers.com.aspx?articleid=[email or web address] These results will be called to the ordering clinician or representative by the Radiologist Assistant, and communication documented in the PACS or [employer]. Electronically Signed   By: Suzy Bouchard M.D.   On: 06/21/2020 12:34     Cardiac Studies   Echocardiogram 06/11/2020: Impressions: 1. Left ventricular ejection fraction, by estimation, is 40 to 45%. The  left ventricle has mildly decreased function. The left ventricle  demonstrates global hypokinesis. Left ventricular diastolic parameters are  indeterminate.  2. Right ventricule is not well visualized but grossly normal size and  systolic function. Tricuspid regurgitation signal is inadequate for  assessing PA pressure.  3. The mitral valve is normal in structure. Trivial mitral valve  regurgitation.  4. The aortic valve was not well visualized. Aortic valve regurgitation  is not visualized. No aortic stenosis is present.  5. The inferior vena cava is dilated in size with <50% respiratory  variability, suggesting right atrial pressure of 15 mmHg.   Patient Profile     39 y.o. female with a history of hypertension, type 2 diabetes, CKD stge III, DVT, recurrent UTIs, and morbid obesity who is  being seen for evaluation of new dilated cardiomyopathy at the request of Dr. Nevada Crane.  Assessment & Plan    New Onset Systolic CHF - Admitted with sepsis secondary to pyelonephritis with hypotension and worsening renal function.  - EF 40-45%, global HK, LBBB on ECG is old - UOP increasing w/out diuretics - no weights this admit>>need - Coreg at 6.25 mg bid since 08/08, increase to 9.75 mg bid and continue hydralazine 10 mg tid - increase Imdur to 30 mg qd - no ACE/ARB/ARNI 2nd CKD - 24 hr urine results pending - MV significantly abnl, med rx only option due to reduced renal function - Dr Debara Pickett discussed this with her in depth on 08/09  Hypertension - continue hydralazine, increase Coreg and Imdur  Uncontrolled Type 2 Diabetes Mellitus - per IM  Severe Sepsis/ Acute Pyelonephritis  - w/ low BP, AKI - 2nd ESBL Klebsiella - per IM/Nephrology - completed meropenem  AKI on CKD Stage IIIb - peak Cr 7.77>> was 3.21 on 08/09 - 2nd acute illness, DM, HTN  nephropathy - per IM, as Nephrology signed off  Severe Anemia - iron is low, ferritin high - H&H had started to improve on 08/09 - has gotten iron - Jehovah's Witness - per IM  For questions or updates, please contact [facility] Please consult [email or web address] for contact info under   Rosaria Ferries, PA-C  06/23/2020, 8:46 AM

## 2020-06-23 NOTE — Progress Notes (Signed)
PROGRESS NOTE  [name] GXQ:[id number] DOB: August 12, 1981 DOA: 06/10/2020 PCP: Charlott Rakes, MD  HPI/Recap of past 24 hours: Patient admitted to the hospital with a working diagnosis ofseveresepsis due to acute pyelonephritis,end-organ failure hypotensionand AKIpresent on admission.Now with worsening renal function and worsening anemia ([name]).  39 year old female with past medical history for type 2 diabetes mellitus, recurrent urinary tract infections (ESBL Klebsiella), hypertension, CKD stage 3b,history of deep vein thrombosis and obesityclass 2. Patient reported 3 days of lower abdominal pain, associated with nausea and vomiting, no frank dysuria. On July 26 she came to the emergency department but she left without being seen. On her initial physical examination she was febrile 39.1 C, blood pressure 135/69, heart rate 111, respiratory rate 13, oxygen saturation 100%. Her lungswereclear to auscultation bilaterally, heart S1-S2, present rhythmic, her abdomen was soft, tender at the suprapubic region, no guarding or rebound, no lower extremity edema. Sodium 133, potassium 4.3, chloride 99, bicarb 22, glucose 508, BUN 34, creatinine 4.0, lipase 23, AST 14, ALT 16, white count 16.8, hemoglobin 8.5, hematocrit 26.2, platelets 302. SARS COVID-19 negative. Urinalysis >500 glucose, >300 protein, specific gravity 1.017, 21-50 red cells,>50 white cells. CT of the abdomen and pelvis showed right-sided perinephric stranding and mild inflammatory changes around the bladder. Chest radiograph with hilar vascular congestion, right base atelectasis, chronic left base scarring. EKG, 104bpm, left axis deviation, left bundle branch block, sinus rhythm, Q wave V1-V2, poor R wave progression, no significant ST segment changes, lead I/aVL T wave versions.   Completed course of meropenem and received fluid resuscitation for hypotension. Developed oliguric renal failure,  likely related to acute ATN from hypotension.  2D Echocardiogram showed LVEF 40-45% and global hypokinesis.  She developedearlysigns of acutepulmonary edema but did not respond to IV furosemide.  Hospital course complicated by severe oliguric AKI which is now improving.  Also complicated by severe anemia, has declined blood transfusion due to religious reasons, [name].   Seen by cardiology due to new onset CHF. Lexiscan myoview stress test done on 06/21/20 and abnormal.  No plan for heart cath during this admission due to AKI.   06/23/20: Reports lightheadedness this morning.  Will obtain orthostatic vital signs.      Assessment/Plan: Principal Problem:   Severe sepsis (HCC) Active Problems:   DM (diabetes mellitus), type 2, uncontrolled ([place])   Obesity (BMI 30-39.9)   Anxiety   Depression   HTN (hypertension)   Migraines   Acute pyelonephritis   AKI (acute kidney injury) ([place])   CKD (chronic kidney disease) stage 3, GFR 30-59 ml/min  Severe sepsis, resolved, 2/2 to right pyelonephritis Current cultures no growth, buturineculture from 07/06 positive for ESBL Klebsiella. Completed antibiotic therapy with meropenem. Also treated with Diflucan for antibiotics related vagina candidiasis.   Afebrile with no leukocytosis, not septic appearing  Newly diagnosed new onset systolic CHF 2D echo done on 06/11/2020 showed LVEF 40 to 45% with left ventricle global hypokinesis CXR suggestive of pulmonary edema Bilateral pitting edema in lower extremities. Cardiology consulted to assist with the management. Holding off diuretics due to recent AKI, last creatinine improving 3.21 from 5.84 from 7.59. Continue strict I's and O's and daily weights Currently on Coreg, hydralazine and Imdur. Avoiding Acei, ARBs, diuretics due to AKI. 2.0 L urine output recorded in the last 24 hours, continue to monitor urine output. Repeat BMP Monday Wednesday Friday with judicious blood  draws.  AKI, improving, on CKD stage 3b/hyperkalemia,non anion gap metabolic acidosis/ ATN, improving. Renal  US with increased echogenicity both kidneys suggesting chronic renal disease, with right kidney atrophy. Serologies negative: GBM, ANA, PANCA, and complement C3 and C4 is high Cr downtrending as stated above Avoid nephrotoxins  Continue phoslo and oral serum bicarboante.  Continue to follow with nephrology recommendations Renal panel on 06/22/2020, creatinine is downtrending, 3.2 with GFR of 20. Creatinine peaked at greater than 7.0 Appreciate nephrology's assistance.  Intermittent dizziness Obtain orthostatic vital signs Fall precautions  Abnormal stress test Leane Call 06/21/20 Not a good candidate for heart cath due to poor renal function at this time Management deferred to cardiology  Severe iron deficiency anemia Patient history of malignancy and has declined blood transfusion Hemoglobin 5.2> 6.3. Received IV Feraheme Continue p.o. ferrous sulfate 325 mg twice daily Continue judicious blood draws.  Resolved Uncontrolled HTN Managed by cardiology  Mild pulmonary edema, suspect cardiogenic No diuretics given due to AKI. Continue incentive spirometer and flutter valve Maintain O2 saturation greater than 92%. Obtain home O2 evaluation on 06/24/2020 for DC planning  Bilateral lower extremity edema Nontender on palpation Advised to elevate legs when sitting  Uncontrolled T2DM Hgb with hyperglycemia. Hemoglobin A1c 12.9 on 06/11/2020. Continue insulin sliding scale for glucose cover and monitoring. Diabetes coordinator following, appreciate assistance  Obesity class 2.Calculated BMI is 39. Recommend weight loss outpatient with regular physical activity and healthy dieting.  Depression.Stable. Continue withariprazole and amitriptyline.  Intermittent tachycardia Resolved on beta-blocker.  Physical debility/deconditioning No further recommendations  per PT Advised to mobilize as tolerated  Status is: Inpatient  Remains inpatient appropriate because:Inpatient level of care appropriate due to severity of illness   Dispo: The patient is from: Home  Anticipated d/c is to: Home  Anticipated d/c date is:06/26/20  Patient currently is not medically stable to d/c due to ongoing management of AKI and new onset systolic CHF.  DVT prophylaxis:      Heparin  SQ TID Code Status:              full  Family Communication:       No family at the bedside     Consultants:   Nephrology   Cardiology.  Antimicrobials:   Completed meropenem.            Objective: Vitals:   06/23/20 0745 06/23/20 1016 06/23/20 1020 06/23/20 1307  BP: 137/79   117/68  Pulse: 95 100 99 94  Resp: 17     Temp: 98.3 F (36.8 C)   98.1 F (36.7 C)  TempSrc: Oral   Oral  SpO2: 100%   99%  Weight:      Height:        Intake/Output Summary (Last 24 hours) at 06/23/2020 1346 Last data filed at 06/23/2020 7510 Gross per 24 hour  Intake --  Output 1200 ml  Net -1200 ml   Filed Weights   06/10/20 0922 06/11/20 0007  Weight: (!) 117.9 kg (!) 124.9 kg    Exam:  . General: 39 y.o. year-old female obese pleasant in no acute distress.  Alert and oriented x3.   . Cardiovascular: Regular rate and rhythm no rubs or gallops. Marland Kitchen Respiratory: Clear to auscultation no wheezes or rales.   . Abdomen: Obese nontender normal bowel sounds present.   . Musculoskeletal: 1+ pitting edema in lower extremities bilaterally.   Marland Kitchen Psychiatry: Mood is appropriate for condition and setting.  Data Reviewed: CBC: Recent Labs  Lab 06/17/20 0338 06/18/20 1405 06/22/20 0603  WBC 10.7* 11.9* 8.6  NEUTROABS 6.9 8.0*  --  HGB 5.5* 5.2* 6.3*  HCT 17.7* 16.6* 21.4*  MCV 90.8 91.7 98.2  PLT 358 410* 500   Basic Metabolic Panel: Recent Labs  Lab 06/17/20 0338 06/18/20 0114 06/20/20 0251 06/22/20 0603  NA 139 139 140 143   K 4.7 4.5 4.1 4.4  CL 103 105 104 108  CO2 19* 19* 23 24  GLUCOSE 188* 166* 172* 164*  BUN 70* 77* 76* 50*  CREATININE 7.77* 7.59* 5.84* 3.21*  CALCIUM 7.6* 7.8* 8.2* 8.1*  PHOS 8.1* 8.2* 6.0* 4.7*   GFR: Estimated Creatinine Clearance: 33.8 mL/min (A) (by C-G formula based on SCr of 3.21 mg/dL (H)). Liver Function Tests: Recent Labs  Lab 06/17/20 0338 06/18/20 0114 06/20/20 0251 06/22/20 0603  ALBUMIN 1.3* 1.5* 1.5* 1.5*   No results for input(s): LIPASE, AMYLASE in the last 168 hours. No results for input(s): AMMONIA in the last 168 hours. Coagulation Profile: No results for input(s): INR, PROTIME in the last 168 hours. Cardiac Enzymes: No results for input(s): CKTOTAL, CKMB, CKMBINDEX, TROPONINI in the last 168 hours. BNP (last 3 results) No results for input(s): PROBNP in the last 8760 hours. HbA1C: No results for input(s): HGBA1C in the last 72 hours. CBG: Recent Labs  Lab 06/22/20 1218 06/22/20 1627 06/22/20 2131 06/23/20 0633 06/23/20 1107  GLUCAP 118* 164* 162* 151* 215*   Lipid Profile: Recent Labs    06/22/20 0603  CHOL 87  HDL 22*  LDLCALC 46  TRIG 95  CHOLHDL 4.0   Thyroid Function Tests: Recent Labs    06/22/20 0603  TSH 2.619   Anemia Panel: No results for input(s): VITAMINB12, FOLATE, FERRITIN, TIBC, IRON, RETICCTPCT in the last 72 hours. Urine analysis:    Component Value Date/Time   COLORURINE YELLOW 06/16/2020 1330   APPEARANCEUR HAZY (A) 06/16/2020 1330   LABSPEC 1.018 06/16/2020 1330   PHURINE 5.0 06/16/2020 1330   GLUCOSEU 150 (A) 06/16/2020 1330   HGBUR MODERATE (A) 06/16/2020 1330   HGBUR negative 12/03/2010 1046   BILIRUBINUR NEGATIVE 06/16/2020 1330   KETONESUR NEGATIVE 06/16/2020 1330   PROTEINUR >=300 (A) 06/16/2020 1330   UROBILINOGEN 0.2 03/19/2020 1853   NITRITE NEGATIVE 06/16/2020 1330   LEUKOCYTESUR SMALL (A) 06/16/2020 1330   Sepsis Labs: @LABRCNTIP (procalcitonin:4,lacticidven:4)  ) No results found for  this or any previous visit (from the past 240 hour(s)).    Studies: No results found.  Scheduled Meds: . (feeding supplement) PROSource Plus  30 mL Oral BID BM  . amitriptyline  10 mg Oral QHS  . atorvastatin  20 mg Oral Daily  . calcium acetate  667 mg Oral TID WC  . carvedilol  9.375 mg Oral BID WC  . Chlorhexidine Gluconate Cloth  6 each Topical Daily  . darbepoetin (ARANESP) injection - NON-DIALYSIS  300 mcg Subcutaneous Q Mon-1800  . ferrous sulfate  325 mg Oral BID WC  . fluticasone  2 spray Each Nare Daily  . heparin  5,000 Units Subcutaneous Q8H  . hydrALAZINE  10 mg Oral Q8H  . insulin aspart  0-9 Units Subcutaneous TID WC  . insulin aspart  2 Units Subcutaneous TID WC  . insulin detemir  8 Units Subcutaneous QHS  . isosorbide mononitrate  30 mg Oral Daily  . metoCLOPramide (REGLAN) injection  5 mg Intravenous Q6H  . multivitamin with minerals  1 tablet Oral Daily  . saccharomyces boulardii  250 mg Oral BID  . sodium bicarbonate  650 mg Oral BID    Continuous Infusions:   LOS:  13 days     Kayleen Memos, MD [facility] Pager [phone]  If 7PM-7AM, please contact night-coverage [email or web address] Password [email or web address] 06/23/2020, 1:46 PM

## 2020-06-24 DIAGNOSIS — I5021 Acute systolic (congestive) heart failure: Secondary | ICD-10-CM

## 2020-06-24 DIAGNOSIS — D508 Other iron deficiency anemias: Secondary | ICD-10-CM

## 2020-06-24 DIAGNOSIS — N183 Chronic kidney disease, stage 3 unspecified: Secondary | ICD-10-CM

## 2020-06-24 LAB — UPEP/UIFE/LIGHT CHAINS/TP, 24-HR UR
% BETA, Urine: 16 %
ALPHA 1 URINE: 11.6 %
Albumin, U: 39.2 %
Alpha 2, Urine: 9.5 %
Free Kappa Lt Chains,Ur: 222.26 mg/L — ABNORMAL HIGH (ref 0.63–113.79)
Free Kappa/Lambda Ratio: 4.46 (ref 1.03–31.76)
Free Lambda Lt Chains,Ur: 49.84 mg/L — ABNORMAL HIGH (ref 0.47–11.77)
GAMMA GLOBULIN URINE: 23.7 %
Total Protein, Urine-Ur/day: 1508 mg/24 hr — ABNORMAL HIGH (ref 30–150)
Total Protein, Urine: 167.6 mg/dL
Total Volume: 900

## 2020-06-24 LAB — BASIC METABOLIC PANEL
Anion gap: 11 (ref 5–15)
BUN: 37 mg/dL — ABNORMAL HIGH (ref 6–20)
CO2: 23 mmol/L (ref 22–32)
Calcium: 8.4 mg/dL — ABNORMAL LOW (ref 8.9–10.3)
Chloride: 108 mmol/L (ref 98–111)
Creatinine, Ser: 2.43 mg/dL — ABNORMAL HIGH (ref 0.44–1.00)
GFR calc Af Amer: 28 mL/min — ABNORMAL LOW (ref >60)
GFR calc non Af Amer: 24 mL/min — ABNORMAL LOW (ref >60)
Glucose, Bld: 149 mg/dL — ABNORMAL HIGH (ref 70–99)
Potassium: 4.8 mmol/L (ref 3.5–5.1)
Sodium: 142 mmol/L (ref 135–145)

## 2020-06-24 LAB — CBC
HCT: 22.8 % — ABNORMAL LOW (ref 36.0–46.0)
Hemoglobin: 6.9 g/dL — CL (ref 12.0–15.0)
MCH: 30 pg (ref 26.0–34.0)
MCHC: 30.3 g/dL (ref 30.0–36.0)
MCV: 99.1 fL (ref 80.0–100.0)
Platelets: 347 10*3/uL (ref 150–400)
RBC: 2.3 MIL/uL — ABNORMAL LOW (ref 3.87–5.11)
RDW: 16.1 % — ABNORMAL HIGH (ref 11.5–15.5)
WBC: 5.9 10*3/uL (ref 4.0–10.5)
nRBC: 0.3 % — ABNORMAL HIGH (ref 0.0–0.2)

## 2020-06-24 LAB — GLUCOSE, CAPILLARY
Glucose-Capillary: 162 mg/dL — ABNORMAL HIGH (ref 70–99)
Glucose-Capillary: 135 mg/dL — ABNORMAL HIGH (ref 70–99)
Glucose-Capillary: 154 mg/dL — ABNORMAL HIGH (ref 70–99)
Glucose-Capillary: 157 mg/dL — ABNORMAL HIGH (ref 70–99)
Glucose-Capillary: 152 mg/dL — ABNORMAL HIGH (ref 70–99)

## 2020-06-24 NOTE — Progress Notes (Signed)
Physical Therapy Treatment Patient Details Name: [name] MRN: [id number] DOB: 01-Aug-1981 Today's Date: 06/24/2020    History of Present Illness Pt adm with severs sepsis due to rt pyelonephritis. Pt also with AKI on CKD and sepsis induced cardiomyopathy. PMH - recurrent UTIs, obesity, DM, anxiety/depression, htn, migraines.    PT Comments    Patient progressing able to ambulate on RA with SpO2 95% or greater.  She needed to stop to catch her breath in hallway, however.  Encouraged ambulation 2 more times today, but to rest in between bouts and go with nursing support.  Patient educated on energy conservation.  Will follow up to ensure safety on stairs if needed for home entry.     Follow Up Recommendations  No PT follow up     Equipment Recommendations  None recommended by PT    Recommendations for Other Services       Precautions / Restrictions Precautions Precautions: None    Mobility  Bed Mobility Overal bed mobility: Independent                Transfers Overall transfer level: Independent                  Ambulation/Gait Ambulation/Gait assistance: Supervision Gait Distance (Feet): 400 Feet Assistive device: None Gait Pattern/deviations: Step-through pattern;WFL(Within Functional Limits)     General Gait Details: Ambulated on RA with SpO2 95-100% but with dyspnea 2-3/4 one standing rest break needed   Stairs             Wheelchair Mobility    Modified Rankin (Stroke Patients Only)       Balance Overall balance assessment: No apparent balance deficits (not formally assessed)                                          Cognition Arousal/Alertness: Awake/alert Behavior During Therapy: WFL for tasks assessed/performed Overall Cognitive Status: Within Functional Limits for tasks assessed                                        Exercises      General Comments General comments (skin  integrity, edema, etc.): Educated in energy conservation and in process of building back up her hemoglobin and how to balance rest with activity.      Pertinent Vitals/Pain Pain Assessment: Faces Faces Pain Scale: Hurts a little bit Pain Location: chest heaviness Pain Descriptors / Indicators: Guarding Pain Intervention(s): Monitored during session    Home Living                      Prior Function            PT Goals (current goals can now be found in the care plan section) Progress towards PT goals: Progressing toward goals    Frequency    Min 3X/week      PT Plan Current plan remains appropriate    Co-evaluation              AM-PAC PT "6 Clicks" Mobility   Outcome Measure  Help needed turning from your back to your side while in a flat bed without using bedrails?: None Help needed moving from lying on your back to sitting on the side of a flat bed without  using bedrails?: None Help needed moving to and from a bed to a chair (including a wheelchair)?: None Help needed standing up from a chair using your arms (e.g., wheelchair or bedside chair)?: None Help needed to walk in hospital room?: None Help needed climbing 3-5 steps with a railing? : A Little 6 Click Score: 23    End of Session   Activity Tolerance: Patient limited by fatigue Patient left: in bed;with call bell/phone within reach (seated on EOB)   PT Visit Diagnosis: Muscle weakness (generalized) (M62.81)     Time: 2119-4174 PT Time Calculation (min) (ACUTE ONLY): 13 min  Charges:  $Gait Training: 8-22 mins                     [name], PT Acute Rehabilitation Services Pager:[phone] Office:[phone] 06/24/2020    [name] 06/24/2020, 1:46 PM

## 2020-06-24 NOTE — Progress Notes (Signed)
Nutrition Follow Up  DOCUMENTATION CODES:   Not applicable  INTERVENTION:   Obtain daily weights    30 ml ProSource Plus BID, each supplement provides 100 kcals and 15 grams protein.   MVI daily   NUTRITION DIAGNOSIS:   Increased nutrient needs related to acute illness as evidenced by estimated needs.  Ongoing  GOAL:   Patient will meet greater than or equal to 90% of their needs   Progressing  MONITOR:   PO intake, Supplement acceptance, Weight trends, I & O's, Labs  REASON FOR ASSESSMENT:   LOS    ASSESSMENT:   Patient with PMH significant for DM, CKD III, DVT, and recurrent UTIs. Presents this admission with R pyelonephritis and AKI.   Unable to reach pt. Per tech pt has good appetite. Meal completion lack documentation but tech reports she's completing meals. Per MAR, pt taking ProSource. Cr trending down. Remains volume overloaded.   Admission weight: 124.9 kg   Medications: Phoslo, aranesp, SS novolog, levemir, 5 mg reglan QID, MVI Labs: Cr 2.43-trending down  Diet Order:   Diet Order            Diet Carb Modified Fluid consistency: Thin; Room service appropriate? Yes  Diet effective now                 EDUCATION NEEDS:   Not appropriate for education at this time  Skin:  Skin Assessment: Reviewed RN Assessment  Last BM:  8/10  Height:   Ht Readings from Last 1 Encounters:  06/10/20 5\' 10"  (1.778 m)    Weight:   Wt Readings from Last 1 Encounters:  06/11/20 (!) 124.9 kg    BMI:  Body mass index is 39.51 kg/m.  Estimated Nutritional Needs:   Kcal:  1900-2100 kcal  Protein:  95-10 grams  Fluid:  >/= 1.9 L/day   Mariana Single RD, LDN Clinical Nutrition Pager listed in [place]

## 2020-06-24 NOTE — Progress Notes (Signed)
Plan of care reviewed. Pt's alert and awake, oriented x 4, hemodynamically stable. Remained afebrile. SPO2 100% on 3 LPM of O2 NCL. NSR on monitor, HR 90s, BP 116/72 - 119/ 79 mmHg. No immediate distress noted tonight. Continue to monitor.  Kennyth Lose, RN

## 2020-06-24 NOTE — Progress Notes (Signed)
Progress Note  Patient Name: [name] Date of Encounter: 06/24/2020  [facility] Cardiologist: Fransico Him, MD   Subjective   Still with some positional dizziness. Orthostatics negative yesterday. Creatinine further improved today to 2.43 (from 3.21 - 2 days ago). Hemoglobin is higher at 6.9 (from 6.3).  Inpatient Medications    Scheduled Meds: . (feeding supplement) PROSource Plus  30 mL Oral BID BM  . amitriptyline  10 mg Oral QHS  . atorvastatin  20 mg Oral Daily  . calcium acetate  667 mg Oral TID WC  . carvedilol  9.375 mg Oral BID WC  . Chlorhexidine Gluconate Cloth  6 each Topical Daily  . darbepoetin (ARANESP) injection - NON-DIALYSIS  300 mcg Subcutaneous Q Mon-1800  . ferrous sulfate  325 mg Oral BID WC  . fluticasone  2 spray Each Nare Daily  . heparin  5,000 Units Subcutaneous Q8H  . hydrALAZINE  10 mg Oral Q8H  . insulin aspart  0-9 Units Subcutaneous TID WC  . insulin aspart  2 Units Subcutaneous TID WC  . insulin detemir  8 Units Subcutaneous QHS  . isosorbide mononitrate  30 mg Oral Daily  . metoCLOPramide (REGLAN) injection  5 mg Intravenous Q6H  . multivitamin with minerals  1 tablet Oral Daily  . saccharomyces boulardii  250 mg Oral BID  . sodium bicarbonate  650 mg Oral BID   Continuous Infusions:  PRN Meds: acetaminophen **OR** acetaminophen, albuterol, fentaNYL (SUBLIMAZE) injection, ondansetron **OR** ondansetron (ZOFRAN) IV, polyethylene glycol   Vital Signs    Vitals:   06/23/20 1724 06/23/20 2049 06/23/20 2353 06/24/20 0451  BP: 135/79 119/79 116/72 126/83  Pulse: 96 94 93 94  Resp: 18 16 20 18   Temp: 98.3 F (36.8 C) 98.5 F (36.9 C) 98.2 F (36.8 C) 98.4 F (36.9 C)  TempSrc: Oral Oral Oral Oral  SpO2:  99% 100% 99%  Weight:      Height:        Intake/Output Summary (Last 24 hours) at 06/24/2020 0801 Last data filed at 06/24/2020 0452 Gross per 24 hour  Intake 250 ml  Output 150 ml  Net 100 ml   Last 3  Weights 06/11/2020 06/10/2020 07/29/2019  Weight (lbs) 275 lb 5.7 oz 260 lb 118 lb  Weight (kg) 124.9 kg 117.935 kg 53.524 kg      Telemetry    Sinus rhythm - Personally Reviewed  ECG    N/A - Personally Reviewed  Physical Exam   GEN: No acute distress.   Neck: No JVD seen, difficult to assess 2nd body habitus Cardiac: RRR, no murmur, no rubs, or gallops.  Respiratory: diminished to auscultation bilaterally (1000 cc on incentive spirometry). GI: Soft, nontender, non-distended  MS: trace LE edema; No deformity. Neuro:  Nonfocal  Psych: Normal affect   Labs    High Sensitivity Troponin:  No results for input(s): TROPONINIHS in the last 720 hours.    Chemistry Recent Labs  Lab 06/18/20 0114 06/18/20 0114 06/20/20 0251 06/22/20 0603 06/24/20 0445  NA 139   < > 140 143 142  K 4.5   < > 4.1 4.4 4.8  CL 105   < > 104 108 108  CO2 19*   < > 23 24 23   GLUCOSE 166*   < > 172* 164* 149*  BUN 77*   < > 76* 50* 37*  CREATININE 7.59*   < > 5.84* 3.21* 2.43*  CALCIUM 7.8*   < > 8.2* 8.1* 8.4*  ALBUMIN 1.5*  --  1.5* 1.5*  --   GFRNONAA 6*   < > 8* 17* 24*  GFRAA 7*   < > 10* 20* 28*  ANIONGAP 15   < > 13 11 11    < > = values in this interval not displayed.     Hematology Recent Labs  Lab 06/18/20 1405 06/22/20 0603 06/24/20 0445  WBC 11.9* 8.6 5.9  RBC 1.81* 2.18* 2.30*  HGB 5.2* 6.3* 6.9*  HCT 16.6* 21.4* 22.8*  MCV 91.7 98.2 99.1  MCH 28.7 28.9 30.0  MCHC 31.3 29.4* 30.3  RDW 12.9 15.2 16.1*  PLT 410* 326 347   Lab Results  Component Value Date   ALT 16 06/10/2020   AST 14 (L) 06/10/2020   ALKPHOS 86 06/10/2020   BILITOT 0.5 06/10/2020   Lab Results  Component Value Date   CHOL 87 06/22/2020   HDL 22 (L) 06/22/2020   LDLCALC 46 06/22/2020   TRIG 95 06/22/2020   CHOLHDL 4.0 06/22/2020   Lab Results  Component Value Date   TSH 2.619 06/22/2020   Lab Results  Component Value Date   IRON 24 (L) 06/14/2020   TIBC 104 (L) 06/14/2020   FERRITIN 1,544  (H) 06/14/2020   BNPNo results for input(s): BNP, PROBNP in the last 168 hours.   DDimer No results for input(s): DDIMER in the last 168 hours.   Radiology    No results found.  Cardiac Studies   Echocardiogram 06/11/2020: Impressions: 1. Left ventricular ejection fraction, by estimation, is 40 to 45%. The  left ventricle has mildly decreased function. The left ventricle  demonstrates global hypokinesis. Left ventricular diastolic parameters are  indeterminate.  2. Right ventricule is not well visualized but grossly normal size and  systolic function. Tricuspid regurgitation signal is inadequate for  assessing PA pressure.  3. The mitral valve is normal in structure. Trivial mitral valve  regurgitation.  4. The aortic valve was not well visualized. Aortic valve regurgitation  is not visualized. No aortic stenosis is present.  5. The inferior vena cava is dilated in size with <50% respiratory  variability, suggesting right atrial pressure of 15 mmHg.   Patient Profile     39 y.o. female with a history of hypertension, type 2 diabetes, CKD stge III, DVT, recurrent UTIs, and morbid obesity who is being seen for evaluation of new dilated cardiomyopathy at the request of Dr. [place] Crane.  Assessment & Plan    New Onset Systolic CHF - Admitted with sepsis secondary to pyelonephritis with hypotension and worsening renal function.  - EF 40-45%, global HK, LBBB on ECG is old - UOP increasing w/out diuretics - no weights this admit>>need - Coreg at 6.25 mg bid since 08/08, increase to 9.75 mg bid and continue hydralazine 10 mg tid - increase Imdur to 30 mg qd - no ACE/ARB/ARNI 2nd CKD - continue conservative care - holding on diuretic, but still volume up - will need to restart low dose lasix once creatinine stabilizes  Hypertension - continue hydralazine, increase Coreg and Imdur -well conrolled  Uncontrolled Type 2 Diabetes Mellitus - per IM  Severe Sepsis/ Acute Pyelonephritis   - w/ low BP, AKI - 2nd ESBL Klebsiella - per IM/Nephrology - completed meropenem   AKI on CKD Stage IIIb - peak Cr 7.77>> now 2.43 - 2nd acute illness, DM, HTN nephropathy - per IM, as Nephrology signed off  Severe Anemia - iron is low, ferritin high - H&H had started to improve  on 08/09 - has gotten iron - [employer] - per IM  For questions or updates, please contact [place] Please consult [email or web address] for contact info under   Pixie Casino, MD, FACC, [place] Director of the [facility] Diplomate of the American Board of Clinical Lipidology Attending Cardiologist  Direct Dial: [phone]  Fax: [phone]  Website:  [email or web address]   Pixie Casino, MD  06/24/2020, 8:01 AM

## 2020-06-24 NOTE — Progress Notes (Signed)
PROGRESS NOTE    [name]  ENI:[id number] DOB: 1981/08/08 DOA: 06/10/2020 PCP: Charlott Rakes, MD    Chief Complaint  Patient presents with   Abdominal Pain   Hyperglycemia   Pelvic Pain    Brief Narrative:  39 year old lady with prior history of type 2 diabetes, recurrent UTI from ESBL Klebsiella, essential hypertension, stage IIIb CKD, DVT, presents to ED with lower abdominal pain associated with nausea and vomiting.  She was admitted with severe sepsis due to acute pyelonephritis, endorgan failure, hypertension and AKI.  CT of the abdomen and pelvis shows right-sided perinephric stranding with mild inflammatory changes around the bladder.  Patient completed the course of meropenem and received fluid resuscitation for hypotension.  Patient developed oliguric renal failure likely related to acute ATN from hypotension. Echocardiogram showed left ventricular ejection fraction of 40 to 45% and global hypokinesis. Pt seen by cardiology for new onset CHF. lexiscan myoview stress test done on 06/21/20 and abnomal. No plan for heart cath during this admission due to AKI.    Assessment & Plan:   Principal Problem:   Severe sepsis ([place]) Active Problems:   DM (diabetes mellitus), type 2, uncontrolled ([facility])   Obesity (BMI 30-39.9)   Anxiety   Depression   HTN (hypertension)   Migraines   Acute pyelonephritis   AKI (acute kidney injury) ([place])   CKD (chronic kidney disease) stage 3, GFR 30-59 ml/min   Severe sepsis Resolved secondary to right pyelonephritis.  Completed meropenam.  Currently afebrile, no leukocytosis.    Newly diag systolic CHF:  ECHO showed LVEF of 40 to 45%with left ventricular global hypokinesis.  Cardiology consulted.  Resume coreg, hydralazine, Imdur.    AKI: Superimposed on stage 3 b CKD/hyperkalemia, non anion gap metabolic acidosis/ ATN Improving.  Continue with phoslo and oral serum bicarbonate.  Appreciate nephrology assistance.     Uncontrolled Hypertension:  Optimal BP parameters.    Bilateral lower extremity edema:  Elevate the legS.     Uncontrolled DM:  CBG (last 3)  Recent Labs    06/23/20 2148 06/24/20 0633 06/24/20 1110  GLUCAP 162* 135* 154*   Resume SSI.    Physical debility/ deconditioning No further recommendations per PT.    DVT prophylaxis: Heparin Code Status: Full code Family Communication: None at bedside Disposition:   Status is: Inpatient  Remains inpatient appropriate because:Ongoing diagnostic testing needed not appropriate for outpatient work up   Dispo:  Patient From: Home  Planned Disposition: Home  Expected discharge date: 06/26/20  Medically stable for discharge: No        Consultants:   Cardiology   Procedures: None   Antimicrobials: None   Subjective: Patient reports occasional chest pressure, no nausea vomiting or abdominal pain no shortness of breath or cough  Objective: Vitals:   06/24/20 0451 06/24/20 0854 06/24/20 0916 06/24/20 1112  BP: 126/83 111/71  128/86  Pulse: 94 97 96 96  Resp: 18 14  14   Temp: 98.4 F (36.9 C) 98.7 F (37.1 C)  98.5 F (36.9 C)  TempSrc: Oral Oral  Oral  SpO2: 99% 99%  98%  Weight:      Height:        Intake/Output Summary (Last 24 hours) at 06/24/2020 1545 Last data filed at 06/24/2020 0452 Gross per 24 hour  Intake 250 ml  Output 150 ml  Net 100 ml   Filed Weights   06/10/20 0922 06/11/20 0007  Weight: (!) 117.9 kg (!) 124.9 kg    Examination:  General exam: Appears calm and comfortable on 2 L of nasal cannula oxygen Respiratory system: Clear to auscultation. Respiratory effort normal. Cardiovascular system: S1 & S2 heard, RRR. No JVD. Gastrointestinal system: Abdomen is nondistended, soft and nontender. Normal bowel sounds heard. Central nervous system: Alert and oriented. No focal neurological deficits. Extremities: Symmetric 5 x 5 power. Skin: No rashes, lesions or ulcers Psychiatry:  Mood & affect appropriate.     Data Reviewed: I have personally reviewed following labs and imaging studies  CBC: Recent Labs  Lab 06/18/20 1405 06/22/20 0603 06/24/20 0445  WBC 11.9* 8.6 5.9  NEUTROABS 8.0*  --   --   HGB 5.2* 6.3* 6.9*  HCT 16.6* 21.4* 22.8*  MCV 91.7 98.2 99.1  PLT 410* 326 425    Basic Metabolic Panel: Recent Labs  Lab 06/18/20 0114 06/20/20 0251 06/22/20 0603 06/24/20 0445  NA 139 140 143 142  K 4.5 4.1 4.4 4.8  CL 105 104 108 108  CO2 19* 23 24 23   GLUCOSE 166* 172* 164* 149*  BUN 77* 76* 50* 37*  CREATININE 7.59* 5.84* 3.21* 2.43*  CALCIUM 7.8* 8.2* 8.1* 8.4*  PHOS 8.2* 6.0* 4.7*  --     GFR: Estimated Creatinine Clearance: 44.7 mL/min (A) (by C-G formula based on SCr of 2.43 mg/dL (H)).  Liver Function Tests: Recent Labs  Lab 06/18/20 0114 06/20/20 0251 06/22/20 0603  ALBUMIN 1.5* 1.5* 1.5*    CBG: Recent Labs  Lab 06/23/20 1107 06/23/20 1648 06/23/20 2148 06/24/20 0633 06/24/20 1110  GLUCAP 215* 120* 162* 135* 154*     No results found for this or any previous visit (from the past 240 hour(s)).       Radiology Studies: No results found.      Scheduled Meds:  (feeding supplement) PROSource Plus  30 mL Oral BID BM   amitriptyline  10 mg Oral QHS   atorvastatin  20 mg Oral Daily   calcium acetate  667 mg Oral TID WC   carvedilol  9.375 mg Oral BID WC   Chlorhexidine Gluconate Cloth  6 each Topical Daily   darbepoetin (ARANESP) injection - NON-DIALYSIS  300 mcg Subcutaneous Q Mon-1800   ferrous sulfate  325 mg Oral BID WC   fluticasone  2 spray Each Nare Daily   heparin  5,000 Units Subcutaneous Q8H   hydrALAZINE  10 mg Oral Q8H   insulin aspart  0-9 Units Subcutaneous TID WC   insulin aspart  2 Units Subcutaneous TID WC   insulin detemir  8 Units Subcutaneous QHS   isosorbide mononitrate  30 mg Oral Daily   metoCLOPramide (REGLAN) injection  5 mg Intravenous Q6H   multivitamin with  minerals  1 tablet Oral Daily   saccharomyces boulardii  250 mg Oral BID   sodium bicarbonate  650 mg Oral BID   Continuous Infusions:   LOS: 14 days        Hosie Poisson, MD [facility]   To contact the attending provider between 7A-7P or the covering provider during after hours 7P-7A, please log into the web site [email or web address] and access using universal [place] password for that web site. If you do not have the password, please call the hospital operator.  06/24/2020, 3:45 PM

## 2020-06-25 DIAGNOSIS — I5021 Acute systolic (congestive) heart failure: Secondary | ICD-10-CM

## 2020-06-25 LAB — GLUCOSE, CAPILLARY
Glucose-Capillary: 156 mg/dL — ABNORMAL HIGH (ref 70–99)
Glucose-Capillary: 136 mg/dL — ABNORMAL HIGH (ref 70–99)
Glucose-Capillary: 123 mg/dL — ABNORMAL HIGH (ref 70–99)
Glucose-Capillary: 143 mg/dL — ABNORMAL HIGH (ref 70–99)

## 2020-06-25 NOTE — Progress Notes (Signed)
Mobility Specialist - Progress Note   06/25/20 1627  Mobility  Activity Ambulated in hall  Level of Assistance Standby assist, set-up cues, supervision of patient - no hands on  Assistive Device None  Distance Ambulated (ft) 340 ft  Mobility Response Tolerated fair  Mobility performed by Mobility specialist  $Mobility charge 1 Mobility    Pre-mobility: 94 HR, 95% SpO2 During mobility: 110 HR, 90% SpO2 Post-mobility: 105 HR, 93% SpO2  Pt required a standing rest break due to feeling out of breath, her SpO2 was 90% at this time. She said she felt better after the rest break and pursed lip breathing.   Pricilla Handler Mobility Specialist Mobility Specialist Phone: [phone]

## 2020-06-25 NOTE — Plan of Care (Signed)
Plan of care reviewed. Pt's been progressing. She's hemodynamically stable. On 2 LPM of O2 NCL, SPO2 96-99%,  no SOB. Remained afebrile. Ambulated independently to bathroom. No acute distress noted. Denied pain. Continue to monitor.  Kennyth Lose, RN

## 2020-06-25 NOTE — Progress Notes (Signed)
Physical Therapy Treatment Patient Details Name: [name] MRN: [id number] DOB: Apr 11, 1981 Today's Date: 06/25/2020    History of Present Illness Pt adm with severe sepsis due to rt pyelonephritis. Pt also with AKI on CKD and sepsis induced cardiomyopathy. PMH - recurrent UTIs, obesity, DM, anxiety/depression, htn, migraines.    PT Comments    Pt seen for amb/stair negotiation on RA. Focused on bed mobility with HOB flat. Pt initially >95% on RA while amb without AD but then dropped to 88% on RA s/p stair negotiation of 5 steps. Pt then stayed at 91% for duration of 150' walk. Pt reporting 3/4 on dyspnea scale. Pt returned to 95% on RA once seated but reported feeling mildly light headed, 2lO2 replaced back on pt. SpO2 at 99% Acute PT to cont to progress indep on RA.   Follow Up Recommendations  No PT follow up     Equipment Recommendations  None recommended by PT    Recommendations for Other Services       Precautions / Restrictions Precautions Precautions: Fall Precaution Comments: watch SpO2 Restrictions Weight Bearing Restrictions: No    Mobility  Bed Mobility Overal bed mobility: Independent             General bed mobility comments: HOB at 16 deg, pt used momentum to pull her self up EOB without using bed rail, educated on rolling to side to push up so not to strain and to conserve energy  Transfers Overall transfer level: Needs assistance Equipment used: None Transfers: Sit to/from Stand Sit to Stand: Supervision         General transfer comment: guarded, wide base of support, no physical assist  Ambulation/Gait Ambulation/Gait assistance: Min guard Gait Distance (Feet): 200 Feet Assistive device: None Gait Pattern/deviations: Step-through pattern;Decreased stride length;Wide base of support Gait velocity: dec Gait velocity interpretation: 1.31 - 2.62 ft/sec, indicative of limited community ambulator General Gait Details: no AD, guarded, pt  with episode of LOB when going to use hand sanitizer to the L requiring modA to recover/prevent fall, pt stated I guess i'm not that steady going to the L   Stairs Stairs: Yes Stairs assistance: Min guard Stair Management: One rail Right;Alternating pattern;Step to pattern Number of Stairs: 5 General stair comments: guarded, SOB, SpO2 at 88% on RA, stopped at bottom to catch breath   Wheelchair Mobility    Modified Rankin (Stroke Patients Only)       Balance Overall balance assessment: Needs assistance         Standing balance support: No upper extremity supported Standing balance-Leahy Scale: Fair Standing balance comment: pt with episode of LOB during ambulation requiring modA to regain balance                            Cognition Arousal/Alertness: Awake/alert Behavior During Therapy: Flat affect Overall Cognitive Status: Within Functional Limits for tasks assessed                                        Exercises      General Comments General comments (skin integrity, edema, etc.): gave energy conservation hand out and educated pt      Pertinent Vitals/Pain Pain Assessment: 0-10 Faces Pain Scale: Hurts a little bit Pain Location: abdominal pain Pain Descriptors / Indicators: Dull Pain Intervention(s): Monitored during session    Home Living  Prior Function            PT Goals (current goals can now be found in the care plan section) Progress towards PT goals: Progressing toward goals    Frequency    Min 3X/week      PT Plan Current plan remains appropriate    Co-evaluation              AM-PAC PT "6 Clicks" Mobility   Outcome Measure  Help needed turning from your back to your side while in a flat bed without using bedrails?: None Help needed moving from lying on your back to sitting on the side of a flat bed without using bedrails?: None Help needed moving to and from a bed to  a chair (including a wheelchair)?: None Help needed standing up from a chair using your arms (e.g., wheelchair or bedside chair)?: None Help needed to walk in hospital room?: None Help needed climbing 3-5 steps with a railing? : A Little 6 Click Score: 23    End of Session Equipment Utilized During Treatment: Gait belt Activity Tolerance: Patient limited by fatigue Patient left: in chair;with call bell/phone within reach Nurse Communication: Mobility status PT Visit Diagnosis: Muscle weakness (generalized) (M62.81)     Time: 7124-5809 PT Time Calculation (min) (ACUTE ONLY): 26 min  Charges:  $Gait Training: 23-37 mins                     Kittie Plater, PT, DPT Acute Rehabilitation Services Pager #: [phone] Office #: [phone]    Berline Lopes 06/25/2020, 8:35 AM

## 2020-06-25 NOTE — Progress Notes (Signed)
PROGRESS NOTE    [name]  JHE:[id number] DOB: 1981/09/14 DOA: 06/10/2020 PCP: Charlott Rakes, MD    Chief Complaint  Patient presents with   Abdominal Pain   Hyperglycemia   Pelvic Pain    Brief Narrative:  39 year old lady with prior history of type 2 diabetes, recurrent UTI from ESBL Klebsiella, essential hypertension, stage IIIb CKD, DVT, presents to ED with lower abdominal pain associated with nausea and vomiting.  She was admitted with severe sepsis due to acute pyelonephritis, endorgan failure, hypertension and AKI.  CT of the abdomen and pelvis shows right-sided perinephric stranding with mild inflammatory changes around the bladder.  Patient completed the course of meropenem and received fluid resuscitation for hypotension.  Patient developed oliguric renal failure likely related to acute ATN from hypotension. Echocardiogram showed left ventricular ejection fraction of 40 to 45% and global hypokinesis. Pt seen by cardiology for new onset CHF. lexiscan myoview stress test done on 06/21/20 and abnomal. No plan for heart cath during this admission due to AKI.   Pt seen and examined at bedside. No new complaints at this time.  Plan to wean her off the oxygen.    Assessment & Plan:   Principal Problem:   Severe sepsis ([place]) Active Problems:   DM (diabetes mellitus), type 2, uncontrolled (HCC)   Obesity (BMI 30-39.9)   Anxiety   Depression   HTN (hypertension)   Migraines   Acute pyelonephritis   AKI (acute kidney injury) ([place])   CKD (chronic kidney disease) stage 3, GFR 30-59 ml/min   Acute systolic (congestive) heart failure (HCC)   Severe sepsis Resolved ,secondary to right pyelonephritis.  Completed meropenam.  Currently afebrile, no leukocytosis. No changes in meds.    Newly diag systolic CHF: she appears to be compensated.  ECHO showed LVEF of 40 to 45%with left ventricular global hypokinesis.  Cardiology consulted.  Resume coreg, hydralazine,  Imdur.  Wean her off the oxygen.    AKI: Superimposed on stage 3 b CKD/hyperkalemia, non anion gap metabolic acidosis/ ATN Improving.  Continue with phoslo and oral serum bicarbonate.  Appreciate nephrology assistance.  Repeat BMP in am and possible d/c home if creatinine continues to improved.    Uncontrolled Hypertension:  BP parameters have improved.    Bilateral lower extremity edema:     Uncontrolled DM with hyperglycemia:  CBG (last 3)  Recent Labs    06/24/20 2159 06/25/20 0635 06/25/20 1210  GLUCAP 152* 156* 136*   Resume SSI.    Physical debility/ deconditioning No further recommendations per PT.    DVT prophylaxis: Heparin Code Status: Full code Family Communication: None at bedside Disposition:   Status is: Inpatient  Remains inpatient appropriate because:Inpatient level of care appropriate due to severity of illness, possible d./c home in am if creatinine continues to improve   Dispo:  Patient From: Home  Planned Disposition: Home  Expected discharge date: 06/26/20  Medically stable for discharge: No        Consultants:   Cardiology   Procedures: None   Antimicrobials: None   Subjective: No new complaints today.   Objective: Vitals:   06/25/20 0756 06/25/20 1212 06/25/20 1213 06/25/20 1701  BP: 122/86 (!) 140/96 (!) 140/96 (!) 141/69  Pulse: 98 100  99  Resp: 18 18    Temp: 98.8 F (37.1 C) 98.8 F (37.1 C)  98.6 F (37 C)  TempSrc: Oral Oral  Oral  SpO2: 99% 99%  98%  Weight:      Height:  Intake/Output Summary (Last 24 hours) at 06/25/2020 1713 Last data filed at 06/25/2020 1300 Gross per 24 hour  Intake 1220 ml  Output --  Net 1220 ml   Filed Weights   06/10/20 0922 06/11/20 0007  Weight: (!) 117.9 kg (!) 124.9 kg    Examination:  General exam: alert and does not appear to be in distress.  Respiratory system: diminished air entry at bases, no wheezing or rhonchi.  Cardiovascular system: S1S2 ,  RRR, no JVD, pedal edema present.  Gastrointestinal system: Abdomen is soft, NT ND BS+ Central nervous system: Alert and oriented, grossly non focal.  Extremities: pedal edema present, no cyanosis,  Skin: No rashes seen.  Psychiatry: flat affect.     Data Reviewed: I have personally reviewed following labs and imaging studies  CBC: Recent Labs  Lab 06/22/20 0603 06/24/20 0445  WBC 8.6 5.9  HGB 6.3* 6.9*  HCT 21.4* 22.8*  MCV 98.2 99.1  PLT 326 916    Basic Metabolic Panel: Recent Labs  Lab 06/20/20 0251 06/22/20 0603 06/24/20 0445  NA 140 143 142  K 4.1 4.4 4.8  CL 104 108 108  CO2 23 24 23   GLUCOSE 172* 164* 149*  BUN 76* 50* 37*  CREATININE 5.84* 3.21* 2.43*  CALCIUM 8.2* 8.1* 8.4*  PHOS 6.0* 4.7*  --     GFR: Estimated Creatinine Clearance: 44.7 mL/min (A) (by C-G formula based on SCr of 2.43 mg/dL (H)).  Liver Function Tests: Recent Labs  Lab 06/20/20 0251 06/22/20 0603  ALBUMIN 1.5* 1.5*    CBG: Recent Labs  Lab 06/24/20 1110 06/24/20 1631 06/24/20 2159 06/25/20 0635 06/25/20 1210  GLUCAP 154* 157* 152* 156* 136*     No results found for this or any previous visit (from the past 240 hour(s)).       Radiology Studies: No results found.      Scheduled Meds:  (feeding supplement) PROSource Plus  30 mL Oral BID BM   amitriptyline  10 mg Oral QHS   atorvastatin  20 mg Oral Daily   calcium acetate  667 mg Oral TID WC   carvedilol  9.375 mg Oral BID WC   Chlorhexidine Gluconate Cloth  6 each Topical Daily   darbepoetin (ARANESP) injection - NON-DIALYSIS  300 mcg Subcutaneous Q Mon-1800   ferrous sulfate  325 mg Oral BID WC   fluticasone  2 spray Each Nare Daily   heparin  5,000 Units Subcutaneous Q8H   hydrALAZINE  10 mg Oral Q8H   insulin aspart  0-9 Units Subcutaneous TID WC   insulin aspart  2 Units Subcutaneous TID WC   insulin detemir  8 Units Subcutaneous QHS   isosorbide mononitrate  30 mg Oral Daily    metoCLOPramide (REGLAN) injection  5 mg Intravenous Q6H   multivitamin with minerals  1 tablet Oral Daily   saccharomyces boulardii  250 mg Oral BID   sodium bicarbonate  650 mg Oral BID   Continuous Infusions:   LOS: 15 days        Hosie Poisson, MD [facility]   To contact the attending provider between 7A-7P or the covering provider during after hours 7P-7A, please log into the web site [email or web address] and access using universal [place] password for that web site. If you do not have the password, please call the hospital operator.  06/25/2020, 5:13 PM

## 2020-06-26 ENCOUNTER — Telehealth: Payer: Self-pay | Admitting: Cardiology

## 2020-06-26 LAB — BASIC METABOLIC PANEL
Anion gap: 11 (ref 5–15)
BUN: 29 mg/dL — ABNORMAL HIGH (ref 6–20)
CO2: 22 mmol/L (ref 22–32)
Calcium: 8.8 mg/dL — ABNORMAL LOW (ref 8.9–10.3)
Chloride: 111 mmol/L (ref 98–111)
Creatinine, Ser: 2.46 mg/dL — ABNORMAL HIGH (ref 0.44–1.00)
GFR calc Af Amer: 28 mL/min — ABNORMAL LOW (ref >60)
GFR calc non Af Amer: 24 mL/min — ABNORMAL LOW (ref >60)
Glucose, Bld: 158 mg/dL — ABNORMAL HIGH (ref 70–99)
Potassium: 5.2 mmol/L — ABNORMAL HIGH (ref 3.5–5.1)
Sodium: 144 mmol/L (ref 135–145)

## 2020-06-26 LAB — CBC
HCT: 25.3 % — ABNORMAL LOW (ref 36.0–46.0)
Hemoglobin: 7.3 g/dL — ABNORMAL LOW (ref 12.0–15.0)
MCH: 28.9 pg (ref 26.0–34.0)
MCHC: 28.9 g/dL — ABNORMAL LOW (ref 30.0–36.0)
MCV: 100 fL (ref 80.0–100.0)
Platelets: 268 10*3/uL (ref 150–400)
RBC: 2.53 MIL/uL — ABNORMAL LOW (ref 3.87–5.11)
RDW: 17 % — ABNORMAL HIGH (ref 11.5–15.5)
WBC: 5.4 10*3/uL (ref 4.0–10.5)
nRBC: 0 % (ref 0.0–0.2)

## 2020-06-26 LAB — GLUCOSE, CAPILLARY
Glucose-Capillary: 148 mg/dL — ABNORMAL HIGH (ref 70–99)
Glucose-Capillary: 141 mg/dL — ABNORMAL HIGH (ref 70–99)

## 2020-06-26 LAB — POTASSIUM: Potassium: 4.9 mmol/L (ref 3.5–5.1)

## 2020-06-26 MED ORDER — FUROSEMIDE 20 MG PO TABS: 20.0000 mg | ORAL_TABLET | Freq: Every day | ORAL | 1 refills | Status: DC

## 2020-06-26 MED ORDER — POLYETHYLENE GLYCOL 3350 17 G PO PACK: 17.0000 g | PACK | Freq: Every day | ORAL | 0 refills | Status: DC | PRN

## 2020-06-26 MED ORDER — PROSOURCE PLUS PO LIQD: 30.0000 mL | Freq: Two times a day (BID) | ORAL | Status: DC

## 2020-06-26 MED ORDER — ISOSORBIDE MONONITRATE ER 30 MG PO TB24: 30.0000 mg | ORAL_TABLET | Freq: Every day | ORAL | 1 refills | Status: DC

## 2020-06-26 MED ORDER — CARVEDILOL 3.125 MG PO TABS: 9.7500 mg | ORAL_TABLET | Freq: Two times a day (BID) | ORAL | 1 refills | Status: DC

## 2020-06-26 MED ORDER — SODIUM POLYSTYRENE SULFONATE 15 GM/60ML PO SUSP
15.0000 g | Freq: Once | ORAL | Status: AC
  Administered 2020-06-26: 15 g via ORAL
  Filled 2020-06-26: qty 60

## 2020-06-26 MED ORDER — FUROSEMIDE 20 MG PO TABS
20.0000 mg | ORAL_TABLET | Freq: Every day | ORAL | Status: DC
  Administered 2020-06-26: 20 mg via ORAL
  Filled 2020-06-26: qty 1

## 2020-06-26 NOTE — Telephone Encounter (Signed)
New message     [facility] appointment scheduled on 07-07-20 with Richardson Dopp

## 2020-06-26 NOTE — Progress Notes (Signed)
Per CCMD, patient had a 6 beat run of Vtach. Patient asymptomatic sitting up in chair. Will continue to monitor.

## 2020-06-26 NOTE — Plan of Care (Signed)
Continue to monitor

## 2020-06-26 NOTE — Progress Notes (Signed)
IV discontinued. Discharge instructions reviewed with patient at this time. All questions answered.

## 2020-06-26 NOTE — Plan of Care (Signed)
  Problem: Activity: Goal: Risk for activity intolerance will decrease Outcome: Progressing   Problem: Education: Goal: Knowledge of General Education information will improve Description: Including pain rating scale, medication(s)/side effects and non-pharmacologic comfort measures Outcome: Progressing   

## 2020-06-26 NOTE — Progress Notes (Signed)
° °  D/w Dr. Karleen [place] - creatinine has stabilized. Off oxygen. Plan for d/c home today - may need low dose lasix 20 mg daily. Initially seen by Dr. Radford Pax, recommend follow-up with [facility] APP or her.  [facility] will sign off.   Medication Recommendations:  As above Other recommendations (labs, testing, etc):  none Follow up as an outpatient:  Dr. Radford Pax or APP at [address]  Pixie Casino, MD, [facility], [place] Director of the [facility]  [facility] Diplomate of the American Board of Clinical Lipidology Attending Cardiologist  Direct Dial: [phone]   Fax: [phone]  Website:  [email or web address]

## 2020-06-26 NOTE — Plan of Care (Signed)
Discharge to home °

## 2020-06-28 NOTE — Discharge Summary (Signed)
Physician Discharge Summary  [name] JHE:[id number] DOB: 12-30-1980 DOA: 06/10/2020  PCP: Charlott Rakes, MD  Admit date: 06/10/2020 Discharge date: 06/26/2020  Admitted From: Home.  Disposition:  Home.   Recommendations for Outpatient Follow-up:  1. Follow up with PCP in 1-2 weeks 2. Please obtain BMP/CBC in one week Please follow up cardiology and nephrology as recommended.   Discharge Condition:stable.  CODE STATUS:FULL CODE.  Diet recommendation: Heart Healthy   Brief/Interim Summary: 39 year old lady with prior history of type 2 diabetes, recurrent UTI from ESBL Klebsiella, essential hypertension, stage IIIb CKD, DVT, presents to ED with lower abdominal pain associated with nausea and vomiting.  She was admitted with severe sepsis due to acute pyelonephritis, endorgan failure, hypertension and AKI.  CT of the abdomen and pelvis shows right-sided perinephric stranding with mild inflammatory changes around the bladder.  Patient completed the course of meropenem and received fluid resuscitation for hypotension.  Patient developed oliguric renal failure likely related to acute ATN from hypotension. Echocardiogram showed left ventricular ejection fraction of 40 to 45% and global hypokinesis. Pt seen by cardiology for new onset CHF. lexiscan myoview stress test done on 06/21/20 and abnomal. No plan for heart cath during this admission due to AKI.   Discharge Diagnoses:  Principal Problem:   Severe sepsis ([place]) Active Problems:   DM (diabetes mellitus), type 2, uncontrolled (HCC)   Obesity (BMI 30-39.9)   Anxiety   Depression   HTN (hypertension)   Migraines   Acute pyelonephritis   AKI (acute kidney injury) ([place])   CKD (chronic kidney disease) stage 3, GFR 30-59 ml/min   Acute systolic (congestive) heart failure (HCC)  Severe sepsis Resolved ,secondary to right pyelonephritis.  Completed meropenam.  Currently afebrile, no leukocytosis. No changes in meds.     Newly diag systolic CHF: she appears to be compensated.  ECHO showed LVEF of 40 to 45%with left ventricular global hypokinesis.  Cardiology consulted.  Resume coreg, hydralazine, Imdur.  Weaned her off the oxygen.    AKI: Superimposed on stage 3 b CKD/hyperkalemia, non anion gap metabolic acidosis/ ATN Improving. Creatinine stabilized at 2.4. Continue with phoslo and oral serum bicarbonate.  Appreciate nephrology assistance.     Uncontrolled Hypertension:  BP parameters have improved.    Bilateral lower extremity edema:     Uncontrolled DM with hyperglycemia:  CBG (last 3)  Recent Labs (last 2 labs)        Recent Labs    06/24/20 2159 06/25/20 0635 06/25/20 1210  GLUCAP 152* 156* 136*     Resume SSI.    Physical debility/ deconditioning No further recommendations per PT.      Discharge Instructions  Discharge Instructions    Diet - low sodium heart healthy   Complete by: As directed    Discharge instructions   Complete by: As directed    Follow up with PCP in less than a week.  Please check your BMP and CBC at PCP office.     Allergies as of 06/26/2020   No Known Allergies     Medication List    STOP taking these medications   cephALEXin 500 MG capsule Commonly known as: KEFLEX   diphenhydrAMINE 25 MG tablet Commonly known as: BENADRYL   ibuprofen 600 MG tablet Commonly known as: ADVIL   OVER THE COUNTER MEDICATION   phenazopyridine 200 MG tablet Commonly known as: PYRIDIUM   propranolol 10 MG tablet Commonly known as: INDERAL     TAKE these medications   (feeding  supplement) PROSource Plus liquid Take 30 mLs by mouth 2 (two) times daily between meals.   acetaminophen 325 MG tablet Commonly known as: TYLENOL Take 650 mg by mouth every 6 (six) hours as needed for mild pain or headache.   amitriptyline 10 MG tablet Commonly known as: ELAVIL Take 1 tablet (10 mg total) by mouth at bedtime. For migraines    ARIPiprazole 20 MG tablet Commonly known as: ABILIFY Take 20 mg by mouth daily.   aspirin EC 81 MG tablet Take 81 mg by mouth daily.   atorvastatin 20 MG tablet Commonly known as: LIPITOR Take 1 tablet (20 mg total) by mouth daily.   benzonatate 200 MG capsule Commonly known as: TESSALON Take 1 capsule (200 mg total) by mouth 2 (two) times daily as needed for cough.   carvedilol 3.125 MG tablet Commonly known as: COREG Take 3 tablets (9.375 mg total) by mouth 2 (two) times daily with a meal.   cloNIDine 0.1 MG tablet Commonly known as: CATAPRES Take 1 tablet (0.1 mg total) by mouth at bedtime. For night sweats   ELDERBERRY PO Take 1 tablet by mouth in the morning and at bedtime.   ferrous sulfate 325 (65 FE) MG tablet Take 325 mg by mouth daily with breakfast.   fluticasone 50 MCG/ACT nasal spray Commonly known as: FLONASE Place 2 sprays into both nostrils daily.   folic acid 664 MCG tablet Commonly known as: FOLVITE Take 800 mcg by mouth daily.   furosemide 20 MG tablet Commonly known as: LASIX Take 1 tablet (20 mg total) by mouth daily.   isosorbide mononitrate 30 MG 24 hr tablet Commonly known as: IMDUR Take 1 tablet (30 mg total) by mouth daily.   multivitamin with minerals Tabs tablet Take 1 tablet by mouth daily.   NovoLOG Mix 70/30 FlexPen (70-30) 100 UNIT/ML FlexPen Generic drug: insulin aspart protamine - aspart Inject 0.35 mLs (35 Units total) into the skin at bedtime.   ondansetron 4 MG disintegrating tablet Commonly known as: Zofran ODT Take 1 tablet (4 mg total) by mouth every 8 (eight) hours as needed for nausea or vomiting.   polyethylene glycol 17 g packet Commonly known as: MIRALAX / GLYCOLAX Take 17 g by mouth daily as needed for moderate constipation.   Proventil HFA 108 (90 Base) MCG/ACT inhaler Generic drug: albuterol INHALE 1 TO 2 PUFFS BY MOUTH EVERY 6 HOURS AS NEEDED FOR COUGHING, WHEEZING, OR SHORTNESS OF BREATH What changed:  See the new instructions.   TURMERIC-GINGER PO Take 1 tablet by mouth daily.   vitamin C 1000 MG tablet Take 1,000 mg by mouth daily.   ZINC PO Take 1 tablet by mouth daily.       Follow-up Information    Charlott Rakes, MD. Schedule an appointment as soon as possible for a visit in 1 week(s).   Specialty: Family Medicine Contact information: [place] [address] [phone]        Dwana Melena, MD. Schedule an appointment as soon as possible for a visit in 2 week(s).   Specialty: Nephrology Contact information: [place] [address] [phone]        Richardson Dopp T, PA-C Follow up.   Specialties: Cardiology, Physician Assistant Why: Hospital follow-up scheduled for 07/07/2020 at 11:45am with Richardson Dopp, one of the PAs in our office. Please arrive 15 minutes early for check-in. If this date/time does not work for you, please call our office to reschedule. Contact information: [address] [place] [address] [phone]              No Known Allergies  Consultations:  Nephrology  Cardiology.    Procedures/Studies: CT Abdomen Pelvis Wo Contrast  Result Date: 06/10/2020 CLINICAL DATA:  Abdominal pain and fever EXAM: CT ABDOMEN AND PELVIS WITHOUT CONTRAST TECHNIQUE: Multidetector CT imaging of the abdomen and pelvis was performed following the standard protocol without IV contrast. COMPARISON:  June 13, 2019 FINDINGS: Lower chest: The visualized heart size within normal limits. No pericardial fluid/thickening. No hiatal hernia. The visualized portions of the lungs are clear. Hepatobiliary: Although limited due to the lack of intravenous contrast, normal in appearance without gross focal abnormality. No evidence of calcified gallstones or biliary ductal dilatation. Pancreas:  Unremarkable.  No surrounding inflammatory changes. Spleen: Normal in size. Although limited due to the lack of intravenous  contrast, normal in appearance. Adrenals/Urinary Tract: Both adrenal glands appear normal. There is question of mild perinephric stranding seen around the lower pole of the right kidney. No renal or collecting system calculi are seen. There is air within the bladder, likely from recent catheterization. There appears to be mild bladder wall thickening. There is mild fat stranding changes seen within the deep pelvis. Stomach/Bowel: The stomach, small bowel, are normal in appearance. The patient is status post appendectomy. There are postsurgical changes seen again within the right lower quadrant and along the anterior lower peritoneum. The colon is unremarkable without significant fat stranding changes or wall thickening. Vascular/Lymphatic: There are no enlarged abdominal or pelvic lymph nodes. No significant gross vascular findings are present. Scattered iliac atherosclerosis is noted. Reproductive: There appears to be mild fat stranding changes seen within the deep pelvis either adjacent adjacent to the uterus which could be from the bladder. Other: No evidence of abdominal wall mass or hernia. Musculoskeletal: No acute or significant osseous findings. IMPRESSION: Mild right-sided perinephric stranding and mild inflammatory changes around the bladder which could be from mild pyelonephritis and cystitis. Inflammatory changes due to surround the deep pelvis and uterus in cannot exclude PID. Electronically Signed   By: Prudencio Pair M.D.   On: 06/10/2020 19:23   DG Chest 1 View  Result Date: 06/11/2020 CLINICAL DATA:  Shortness of breath EXAM: CHEST  1 VIEW COMPARISON:  12/11/2019 FINDINGS: Cardiac shadow is within normal limits. Lungs are well aerated bilaterally. Linear scarring is again seen in the left base. No bony abnormality is noted. IMPRESSION: Chronic scarring in the left base.  No acute abnormality noted. Electronically Signed   By: Inez Catalina M.D.   On: 06/11/2020 03:32   US PELVIS (TRANSABDOMINAL  ONLY)  Result Date: 06/10/2020 CLINICAL DATA:  Bilateral pelvic pain for 3 days EXAM: TRANSABDOMINAL ULTRASOUND OF PELVIS TECHNIQUE: Transabdominal ultrasound examination of the pelvis was performed including evaluation of the uterus, ovaries, adnexal regions, and pelvic cul-de-sac. Patient declined endovaginal exam. COMPARISON:  None. FINDINGS: Uterus Not well-defined. Endometrium Not visualized. Right ovary Not visualized. Left ovary Not visualized. Other findings: Technically limited evaluation of the pelvis given body habitus and nondistention of the urinary bladder. Uterus and ovaries were not visualized. No obvious adnexal mass. The patient declined endovaginal exam. IMPRESSION: Technically limited transabdominal evaluation of the pelvis. The uterus and ovaries were not visualized. Electronically Signed   By: Keith Rake M.D.   On: 06/10/2020 17:45   US RENAL  Result Date: 06/12/2020 CLINICAL DATA:  Renal failure. EXAM: RENAL / URINARY TRACT ULTRASOUND COMPLETE COMPARISON:  CT 06/10/2020.  FINDINGS: Right Kidney: Renal measurements: 8.5 x 4.1 x 4.8 cm = volume: 87.3 mL. Small right kidney suggesting some degree of atrophy. Increased echogenicity. No mass or hydronephrosis visualized. Left Kidney: Renal measurements: 12.2 x 6.6 x 4.5 cm = volume: 188.0 mL. Increased echogenicity. No mass or hydronephrosis visualized. Bladder: Appears normal for degree of bladder distention. Other: None. IMPRESSION: 1. Increased echogenicity both kidneys suggesting chronic renal disease. Small right kidney suggesting a degree of atrophy. 2. No acute or focal abnormality. No evidence of hydronephrosis or bladder distention. Electronically Signed   By: Marcello Moores  Register   On: 06/12/2020 05:39   NM Myocar Multi W/Spect Tamela Oddi Motion / EF  Result Date: 06/21/2020 CLINICAL DATA:  39 year old female with cardiomyopathy. EXAM: MYOCARDIAL IMAGING WITH SPECT (REST AND PHARMACOLOGIC-STRESS) GATED LEFT VENTRICULAR WALL MOTION  STUDY LEFT VENTRICULAR EJECTION FRACTION TECHNIQUE: Standard myocardial SPECT imaging was performed after resting intravenous injection of 10 mCi Tc-101m tetrofosmin. Subsequently, intravenous infusion of Lexiscan was performed under the supervision of the Cardiology staff. At peak effect of the drug, 30 mCi Tc-19m tetrofosmin was injected intravenously and standard myocardial SPECT imaging was performed. Quantitative gated imaging was also performed to evaluate left ventricular wall motion, and estimate left ventricular ejection fraction. COMPARISON:  None. FINDINGS: Perfusion: LEFT ventricular dilatation similar on rest and stress imaging. There are decreased counts within the apical, mid, and basilar segments anterior septal wall which improved from stress to rest. There are decreased counts on rest imaging in this region however again there is improvement/reversibility. Wall Motion: Normal left ventricular wall motion. No left ventricular dilation. Left Ventricular Ejection Fraction: 48 % End diastolic volume 53 ml End systolic volume 50 ml IMPRESSION: 1. Moderate region of reversible ischemia within the anterior septal wall. 2. Normal left ventricular wall motion. LEFT ventricular dilatation. 3. Left ventricular ejection fraction 48% 4. Non invasive risk stratification*: High (risk stratification based on combination of ischemia, LEFT ventricular dilatation and low EF). *2012 Appropriate Use Criteria for Coronary Revascularization Focused Update: J Am Coll Cardiol. 2376;28(3):151-761. http://content.airportbarriers.com.aspx?articleid=1201161 These results will be called to the ordering clinician or representative by the Radiologist Assistant, and communication documented in the PACS or [employer]. Electronically Signed   By: Suzy Bouchard M.D.   On: 06/21/2020 12:34   DG CHEST PORT 1 VIEW  Result Date: 06/20/2020 CLINICAL DATA:  Evaluate for atelectasis, cardiopulmonary disease. EXAM: PORTABLE  CHEST 1 VIEW COMPARISON:  06/14/2020 FINDINGS: Shallow lung inflation. There is increased prominence of interstitial markings particularly at the lung bases, LEFT greater than RIGHT. Heart size is likely normal accounting for patient position. IMPRESSION: Increased interstitial markings at the lung bases, LEFT greater than RIGHT. Electronically Signed   By: Nolon Nations M.D.   On: 06/20/2020 10:18   DG CHEST PORT 1 VIEW  Result Date: 06/14/2020 CLINICAL DATA:  Acute onset of mid chest pain and shortness of breath. EXAM: PORTABLE CHEST 1 VIEW COMPARISON:  06/11/2020 and earlier. FINDINGS: Suboptimal inspiration accounts for crowded bronchovascular markings, especially in the bases, and accentuates the cardiac silhouette. Taking this into account, cardiac silhouette upper normal in size for AP portable technique. Stable minimal linear atelectasis in the LEFT mid lung. Lungs otherwise clear. Mild pulmonary venous hypertension without overt edema. IMPRESSION: Suboptimal inspiration. Stable minimal linear atelectasis in the LEFT mid lung. No acute cardiopulmonary disease otherwise. Electronically Signed   By: Evangeline Dakin M.D.   On: 06/14/2020 08:30   ECHOCARDIOGRAM COMPLETE  Result Date: 06/11/2020    ECHOCARDIOGRAM REPORT  Patient Name:   CALEIGH RABELO [facility] Date of Exam: 06/11/2020 Medical Rec #:  [id number]             Height:       70.0 in Accession #:    [id number]            Weight:       275.4 lb Date of Birth:  April 20, 1981             BSA:          2.391 m Patient Age:    39 years              BP:           93/46 mmHg Patient Gender: F                     HR:           107 bpm. Exam Location:  Inpatient Procedure: 2D Echo, Cardiac Doppler, Color Doppler and Intracardiac            Opacification Agent Indications:    Dyspnea 786.09 / R06.00  History:        Patient has prior history of Echocardiogram examinations, most                 recent 02/16/2010. Risk Factors:Hypertension, Diabetes and                  Non-Smoker.  Sonographer:    Vickie Epley RDCS Referring Phys: [id number] [place]  1. Left ventricular ejection fraction, by estimation, is 40 to 45%. The left ventricle has mildly decreased function. The left ventricle demonstrates global hypokinesis. Left ventricular diastolic parameters are indeterminate.  2. Right ventricule is not well visualized but grossly normal size and systolic function. Tricuspid regurgitation signal is inadequate for assessing PA pressure.  3. The mitral valve is normal in structure. Trivial mitral valve regurgitation.  4. The aortic valve was not well visualized. Aortic valve regurgitation is not visualized. No aortic stenosis is present.  5. The inferior vena cava is dilated in size with <50% respiratory variability, suggesting right atrial pressure of 15 mmHg. FINDINGS  Left Ventricle: Left ventricular ejection fraction, by estimation, is 40 to 45%. The left ventricle has mildly decreased function. The left ventricle demonstrates global hypokinesis. Definity contrast agent was given IV to delineate the left ventricular  endocardial borders. The left ventricular internal cavity size was normal in size. There is no left ventricular hypertrophy. Left ventricular diastolic parameters are indeterminate. Right Ventricle: The right ventricular size is normal. Right vetricular wall thickness was not assessed. Right ventricular systolic function is normal. Tricuspid regurgitation signal is inadequate for assessing PA pressure. Left Atrium: Left atrial size was normal in size. Right Atrium: Right atrial size was normal in size. Pericardium: There is no evidence of pericardial effusion. Mitral Valve: The mitral valve is normal in structure. Trivial mitral valve regurgitation. Tricuspid Valve: The tricuspid valve is normal in structure. Tricuspid valve regurgitation is trivial. Aortic Valve: The aortic valve was not well visualized. Aortic valve regurgitation is not  visualized. No aortic stenosis is present. Pulmonic Valve: The pulmonic valve was not well visualized. Pulmonic valve regurgitation is not visualized. Aorta: The aortic root is normal in size and structure. Venous: The inferior vena cava is dilated in size with less than 50% respiratory variability, suggesting right atrial pressure of 15 mmHg. IAS/Shunts: The interatrial septum was not well visualized.  LEFT  VENTRICLE PLAX 2D LVIDd:         4.70 cm      Diastology LVIDs:         3.40 cm      LV e' lateral:   9.46 cm/s LV PW:         0.80 cm      LV E/e' lateral: 11.1 LV IVS:        0.80 cm      LV e' medial:    6.85 cm/s LVOT diam:     2.00 cm      LV E/e' medial:  15.3 LV SV:         50 LV SV Index:   21 LVOT Area:     3.14 cm  LV Volumes (MOD) LV vol d, MOD A2C: 156.0 ml LV vol d, MOD A4C: 150.0 ml LV vol s, MOD A2C: 85.7 ml LV vol s, MOD A4C: 78.8 ml LV SV MOD A2C:     70.3 ml LV SV MOD A4C:     150.0 ml LV SV MOD BP:      66.7 ml RIGHT VENTRICLE RV S prime:     12.10 cm/s TAPSE (M-mode): 1.8 cm LEFT ATRIUM           Index       RIGHT ATRIUM           Index LA diam:      4.30 cm 1.80 cm/m  RA Area:     13.70 cm LA Vol (A2C): 39.6 ml 16.56 ml/m RA Volume:   35.30 ml  14.76 ml/m LA Vol (A4C): 45.3 ml 18.94 ml/m  AORTIC VALVE LVOT Vmax:   104.00 cm/s LVOT Vmean:  72.200 cm/s LVOT VTI:    0.160 m  AORTA Ao Root diam: 2.70 cm MITRAL VALVE MV Area (PHT): 5.13 cm     SHUNTS MV Decel Time: 148 msec     Systemic VTI:  0.16 m MR Peak grad: 66.9 mmHg     Systemic Diam: 2.00 cm MR Vmax:      409.00 cm/s MV E velocity: 105.00 cm/s MV A velocity: 64.90 cm/s MV E/A ratio:  1.62 Oswaldo Milian MD Electronically signed by Oswaldo Milian MD Signature Date/Time: 06/11/2020/9:24:19 PM    Final        Subjective: No new complaints.   Discharge Exam: Vitals:   06/26/20 1129 06/26/20 1516  BP: (!) 137/91 (!) 145/86  Pulse: 97 (!) 105  Resp: 18   Temp: 98.6 F (37 C) 98.5 F (36.9 C)  SpO2: 99%     Vitals:   06/26/20 0828 06/26/20 0925 06/26/20 1129 06/26/20 1516  BP: (!) 138/91  (!) 137/91 (!) 145/86  Pulse: 100 97 97 (!) 105  Resp: 18  18   Temp: 98.5 F (36.9 C)  98.6 F (37 C) 98.5 F (36.9 C)  TempSrc: Oral  Oral Oral  SpO2: 98%  99%   Weight:      Height:        General: Pt is alert, awake, not in acute distress Cardiovascular: RRR, S1/S2 +, no rubs, no gallops Respiratory: CTA bilaterally, no wheezing, no rhonchi Abdominal: Soft, NT, ND, bowel sounds + Extremities: no edema, no cyanosis    The results of significant diagnostics from this hospitalization (including imaging, microbiology, ancillary and laboratory) are listed below for reference.     Microbiology: No results found for this or any previous visit (from the past 240 hour(s)).  Labs: BNP (last 3 results) No results for input(s): BNP in the last 8760 hours. Basic Metabolic Panel: Recent Labs  Lab 06/22/20 0603 06/24/20 0445 06/26/20 0617 06/26/20 1301  NA 143 142 144  --   K 4.4 4.8 5.2* 4.9  CL 108 108 111  --   CO2 24 23 22   --   GLUCOSE 164* 149* 158*  --   BUN 50* 37* 29*  --   CREATININE 3.21* 2.43* 2.46*  --   CALCIUM 8.1* 8.4* 8.8*  --   PHOS 4.7*  --   --   --    Liver Function Tests: Recent Labs  Lab 06/22/20 0603  ALBUMIN 1.5*   No results for input(s): LIPASE, AMYLASE in the last 168 hours. No results for input(s): AMMONIA in the last 168 hours. CBC: Recent Labs  Lab 06/22/20 0603 06/24/20 0445 06/26/20 0617  WBC 8.6 5.9 5.4  HGB 6.3* 6.9* 7.3*  HCT 21.4* 22.8* 25.3*  MCV 98.2 99.1 100.0  PLT 326 347 268   Cardiac Enzymes: No results for input(s): CKTOTAL, CKMB, CKMBINDEX, TROPONINI in the last 168 hours. BNP: Invalid input(s): POCBNP CBG: Recent Labs  Lab 06/25/20 1210 06/25/20 1722 06/25/20 2209 06/26/20 0624 06/26/20 1125  GLUCAP 136* 123* 143* 148* 141*   D-Dimer No results for input(s): DDIMER in the last 72 hours. Hgb A1c No results for  input(s): HGBA1C in the last 72 hours. Lipid Profile No results for input(s): CHOL, HDL, LDLCALC, TRIG, CHOLHDL, LDLDIRECT in the last 72 hours. Thyroid function studies No results for input(s): TSH, T4TOTAL, T3FREE, THYROIDAB in the last 72 hours.  Invalid input(s): FREET3 Anemia work up No results for input(s): VITAMINB12, FOLATE, FERRITIN, TIBC, IRON, RETICCTPCT in the last 72 hours. Urinalysis    Component Value Date/Time   COLORURINE YELLOW 06/16/2020 1330   APPEARANCEUR HAZY (A) 06/16/2020 1330   LABSPEC 1.018 06/16/2020 1330   PHURINE 5.0 06/16/2020 1330   GLUCOSEU 150 (A) 06/16/2020 1330   HGBUR MODERATE (A) 06/16/2020 1330   HGBUR negative 12/03/2010 1046   BILIRUBINUR NEGATIVE 06/16/2020 1330   KETONESUR NEGATIVE 06/16/2020 1330   PROTEINUR >=300 (A) 06/16/2020 1330   UROBILINOGEN 0.2 03/19/2020 1853   NITRITE NEGATIVE 06/16/2020 1330   LEUKOCYTESUR SMALL (A) 06/16/2020 1330   Sepsis Labs Invalid input(s): PROCALCITONIN,  WBC,  LACTICIDVEN Microbiology No results found for this or any previous visit (from the past 240 hour(s)).   Time coordinating discharge: 35 minutes.   SIGNED:   Hosie Poisson, MD  [facility] 06/28/2020, 1:01 PM

## 2020-06-29 ENCOUNTER — Telehealth: Payer: Self-pay

## 2020-06-29 NOTE — Telephone Encounter (Signed)
**Note De-Identified  Obfuscation** 1st attempted TCM Call. No answer so I left a detailed message on the pts VM asking her to call Jeani Hawking back at [facility] at [phone] and that if she calls back after 3 pm today to either ask to s/w a triage nurse or kleave a message with operatorand I will call her back tomorrow.

## 2020-06-29 NOTE — Telephone Encounter (Signed)
Transition Care Management Follow-up Telephone Call Date of discharge and from where: 06/26/2020, [facility]   Call placed to patient # [id number], message left with call back requested to this CM # [phone].  She has follow up appt with Dr Margarita Rana 07/29/2020 but an appointment may be scheduled sooner if she wishes

## 2020-06-30 ENCOUNTER — Telehealth: Payer: Self-pay

## 2020-06-30 NOTE — Telephone Encounter (Signed)
Transition Care Management Follow-up Telephone Call Attempt # 2  Date of discharge and from where: 06/26/2020, [facility]   Call placed to patient # [phone], message left with call back requested to this CM # [phone].  She has follow up appt with Dr Margarita Rana 07/29/2020 but an appointment may be scheduled sooner if she wishes

## 2020-06-30 NOTE — Telephone Encounter (Signed)
**Note De-Identified  Obfuscation** 2nd attempted TCM Call: No answer so I left a message on the pts VM asking her to call Jeani Hawking at [facility] at [phone] and I did leave a reminder that she has a post hosp f/u scheduled with Richardson Dopp, PA-c on 8/ 24 at 11:45 at [facility] at [address] in [place], [place].
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
## 2020-07-07 ENCOUNTER — Telehealth: Payer: Self-pay

## 2020-07-07 ENCOUNTER — Encounter: Payer: Self-pay | Admitting: Physician Assistant

## 2020-07-07 ENCOUNTER — Other Ambulatory Visit: Payer: Self-pay

## 2020-07-07 ENCOUNTER — Ambulatory Visit (INDEPENDENT_AMBULATORY_CARE_PROVIDER_SITE_OTHER): Payer: Self-pay | Admitting: Physician Assistant

## 2020-07-07 VITALS — BP 138/80 | HR 109 | Ht 70.0 in | Wt 287.0 lb

## 2020-07-07 DIAGNOSIS — I25119 Atherosclerotic heart disease of native coronary artery with unspecified angina pectoris: Secondary | ICD-10-CM

## 2020-07-07 DIAGNOSIS — N1832 Chronic kidney disease, stage 3b: Secondary | ICD-10-CM

## 2020-07-07 DIAGNOSIS — I5023 Acute on chronic systolic (congestive) heart failure: Secondary | ICD-10-CM

## 2020-07-07 DIAGNOSIS — E1165 Type 2 diabetes mellitus with hyperglycemia: Secondary | ICD-10-CM

## 2020-07-07 DIAGNOSIS — I255 Ischemic cardiomyopathy: Secondary | ICD-10-CM

## 2020-07-07 DIAGNOSIS — I10 Essential (primary) hypertension: Secondary | ICD-10-CM

## 2020-07-07 DIAGNOSIS — D649 Anemia, unspecified: Secondary | ICD-10-CM

## 2020-07-07 MED ORDER — FUROSEMIDE 40 MG PO TABS: ORAL_TABLET | ORAL | 3 refills | Status: DC

## 2020-07-07 NOTE — Patient Instructions (Signed)
Medication Instructions:  Your physician has recommended you make the following change in your medication:   1) Increase Lasix to 40 mg, 1 tablet by mouth twice a day for 3 days, then take 1 tablet by mouth once a day  *If you need a refill on your cardiac medications before your next appointment, please call your pharmacy*  Lab Work: You will have labs drawn today: BMET/BNP  Testing/Procedures: None ordered today  Follow-Up: On 07/14/20 at 11:15AM with Richardson Dopp, PA-C  Other Instructions Weigh yourself daily. Call us if your weight goes up 3 pounds or more in one day.

## 2020-07-07 NOTE — Telephone Encounter (Signed)
I called and left patient a message to call back to discuss Nitroglycerin.

## 2020-07-07 NOTE — Telephone Encounter (Signed)
-----   Message from Liliane Shi, [place] sent at 07/07/2020  1:10 PM EDT ----- Regarding: NTG She should have PRN NTG. Can you send in a Rx for her and let her know? NTG 0.4 mg SL prn chest pain, #25, 11 Refills Thanks [employer]

## 2020-07-08 LAB — BASIC METABOLIC PANEL
BUN/Creatinine Ratio: 10 (ref 9–23)
BUN: 22 mg/dL — ABNORMAL HIGH (ref 6–20)
CO2: 23 mmol/L (ref 20–29)
Calcium: 8.6 mg/dL — ABNORMAL LOW (ref 8.7–10.2)
Chloride: 108 mmol/L — ABNORMAL HIGH (ref 96–106)
Creatinine, Ser: 2.29 mg/dL — ABNORMAL HIGH (ref 0.57–1.00)
GFR calc Af Amer: 30 mL/min/{1.73_m2} — ABNORMAL LOW (ref >59)
GFR calc non Af Amer: 26 mL/min/{1.73_m2} — ABNORMAL LOW (ref >59)
Glucose: 65 mg/dL (ref 65–99)
Potassium: 5.6 mmol/L — ABNORMAL HIGH (ref 3.5–5.2)
Sodium: 142 mmol/L (ref 134–144)

## 2020-07-08 LAB — PRO B NATRIURETIC PEPTIDE: NT-Pro BNP: 2301 pg/mL — ABNORMAL HIGH (ref 0–130)

## 2020-07-10 ENCOUNTER — Telehealth: Payer: Self-pay | Admitting: Physician Assistant

## 2020-07-10 MED ORDER — NITROGLYCERIN 0.4 MG SL SUBL: 0.4000 mg | SUBLINGUAL_TABLET | SUBLINGUAL | 11 refills | Status: AC | PRN

## 2020-07-10 NOTE — Telephone Encounter (Signed)
New message ° ° ° ° °Returning a call to the nurse °

## 2020-07-10 NOTE — Addendum Note (Signed)
Addended by: Mady Haagensen on: 07/10/2020 04:40 PM   Modules accepted: Orders

## 2020-07-10 NOTE — Telephone Encounter (Signed)
Patient returned my call, she is aware to start Nitroglycerin as needed for chest pain. Instructed patient on how to use Nitroglycerin and if patient has to take a 3rd dose, call 911. Patient verbalized understanding and thanked me for the call.

## 2020-07-10 NOTE — Telephone Encounter (Signed)
I called and left patient a message to call back. 
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
## 2020-07-14 ENCOUNTER — Encounter: Payer: Self-pay | Admitting: Physician Assistant

## 2020-07-14 ENCOUNTER — Other Ambulatory Visit: Payer: Self-pay

## 2020-07-14 VITALS — Ht 70.0 in

## 2020-07-21 ENCOUNTER — Telehealth: Payer: Self-pay | Admitting: Physician Assistant

## 2020-07-21 NOTE — Telephone Encounter (Signed)
New Message   Patient sent a mychart message requesting a letter for work since she was out in August.

## 2020-07-22 NOTE — Telephone Encounter (Signed)
I called and left patient a message asking what days she needed the work note for in August? Is it for the whole month of August or just specific dates? Also, patient had insurance issues and had to reschedule office visit with Scott to 08/11/20. Nicki Reaper would like patient to come in for BMET before that if possible.

## 2020-07-27 ENCOUNTER — Other Ambulatory Visit: Payer: Self-pay

## 2020-07-27 DIAGNOSIS — I10 Essential (primary) hypertension: Secondary | ICD-10-CM

## 2020-07-27 DIAGNOSIS — I5023 Acute on chronic systolic (congestive) heart failure: Secondary | ICD-10-CM

## 2020-07-27 NOTE — Telephone Encounter (Signed)
Can we find out what type of work she does? Has she already returned to work? Richardson Dopp, PA-C    07/27/2020 4:55 PM

## 2020-07-27 NOTE — Telephone Encounter (Signed)
I sent patient a my chart message and she did respond, patient is scheduled for BMET on 07/29/20. Patient also needs a work note from 06/26/20 to 07/14/20.

## 2020-07-28 NOTE — Telephone Encounter (Signed)
Letter sent to patient. Richardson Dopp, PA-C    07/28/2020 1:53 PM

## 2020-07-28 NOTE — Telephone Encounter (Signed)
Letter sent to MyChart. Richardson Dopp, PA-C    07/28/2020 1:42 PM

## 2020-07-28 NOTE — Telephone Encounter (Signed)
See mychart message from patient.

## 2020-07-29 ENCOUNTER — Other Ambulatory Visit: Payer: Self-pay | Admitting: *Deleted

## 2020-07-29 ENCOUNTER — Encounter: Payer: Self-pay | Admitting: Family Medicine

## 2020-07-29 ENCOUNTER — Encounter: Payer: Self-pay | Admitting: Licensed Clinical Social Worker

## 2020-07-29 ENCOUNTER — Other Ambulatory Visit: Payer: Self-pay

## 2020-07-29 ENCOUNTER — Ambulatory Visit: Payer: Self-pay | Attending: Family Medicine | Admitting: Family Medicine

## 2020-07-29 VITALS — BP 150/89 | HR 109 | Ht 70.0 in | Wt 278.0 lb

## 2020-07-29 DIAGNOSIS — L84 Corns and callosities: Secondary | ICD-10-CM

## 2020-07-29 DIAGNOSIS — Z794 Long term (current) use of insulin: Secondary | ICD-10-CM

## 2020-07-29 DIAGNOSIS — N184 Chronic kidney disease, stage 4 (severe): Secondary | ICD-10-CM

## 2020-07-29 DIAGNOSIS — E1165 Type 2 diabetes mellitus with hyperglycemia: Secondary | ICD-10-CM

## 2020-07-29 DIAGNOSIS — G43109 Migraine with aura, not intractable, without status migrainosus: Secondary | ICD-10-CM

## 2020-07-29 DIAGNOSIS — I10 Essential (primary) hypertension: Secondary | ICD-10-CM

## 2020-07-29 DIAGNOSIS — F419 Anxiety disorder, unspecified: Secondary | ICD-10-CM

## 2020-07-29 DIAGNOSIS — I5022 Chronic systolic (congestive) heart failure: Secondary | ICD-10-CM

## 2020-07-29 DIAGNOSIS — I129 Hypertensive chronic kidney disease with stage 1 through stage 4 chronic kidney disease, or unspecified chronic kidney disease: Secondary | ICD-10-CM

## 2020-07-29 DIAGNOSIS — I110 Hypertensive heart disease with heart failure: Secondary | ICD-10-CM

## 2020-07-29 DIAGNOSIS — D638 Anemia in other chronic diseases classified elsewhere: Secondary | ICD-10-CM

## 2020-07-29 DIAGNOSIS — I5023 Acute on chronic systolic (congestive) heart failure: Secondary | ICD-10-CM

## 2020-07-29 DIAGNOSIS — E1122 Type 2 diabetes mellitus with diabetic chronic kidney disease: Secondary | ICD-10-CM

## 2020-07-29 LAB — BASIC METABOLIC PANEL
BUN/Creatinine Ratio: 11 (ref 9–23)
BUN: 25 mg/dL — ABNORMAL HIGH (ref 6–20)
CO2: 20 mmol/L (ref 20–29)
Calcium: 8.8 mg/dL (ref 8.7–10.2)
Chloride: 110 mmol/L — ABNORMAL HIGH (ref 96–106)
Creatinine, Ser: 2.34 mg/dL — ABNORMAL HIGH (ref 0.57–1.00)
GFR calc Af Amer: 29 mL/min/{1.73_m2} — ABNORMAL LOW (ref >59)
GFR calc non Af Amer: 25 mL/min/{1.73_m2} — ABNORMAL LOW (ref >59)
Glucose: 59 mg/dL — ABNORMAL LOW (ref 65–99)
Potassium: 4.2 mmol/L (ref 3.5–5.2)
Sodium: 144 mmol/L (ref 134–144)

## 2020-07-29 LAB — GLUCOSE, POCT (MANUAL RESULT ENTRY): POC Glucose: 113 mg/dl — AB (ref 70–99)

## 2020-07-29 MED ORDER — HYDROXYZINE HCL 25 MG PO TABS: 25.0000 mg | ORAL_TABLET | Freq: Three times a day (TID) | ORAL | 1 refills | Status: DC | PRN

## 2020-07-29 MED ORDER — TRULICITY 0.75 MG/0.5ML ~~LOC~~ SOAJ: 0.7500 mg | SUBCUTANEOUS | 1 refills | Status: DC

## 2020-07-29 MED ORDER — NOVOLOG MIX 70/30 FLEXPEN (70-30) 100 UNIT/ML ~~LOC~~ SUPN: 18.0000 [IU] | PEN_INJECTOR | Freq: Two times a day (BID) | SUBCUTANEOUS | 6 refills | Status: AC

## 2020-07-29 MED ORDER — FERROUS SULFATE 324 (65 FE) MG PO TBEC: 324.0000 mg | DELAYED_RELEASE_TABLET | Freq: Two times a day (BID) | ORAL | 3 refills | Status: AC
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
## 2020-07-29 NOTE — Progress Notes (Signed)
LCSW introduced self and explained role at [facility]. Pt reported difficulty managing depression and anxiety symptoms and agreed to schedule IBH appointment, scheduled for 08/05/20. No additional concerns noted.

## 2020-07-29 NOTE — Patient Instructions (Addendum)
Dulaglutide injection What is this medicine? DULAGLUTIDE (DOO la GLOO tide) is used to improve blood sugar control in adults with type 2 diabetes. This medicine may be used with other oral diabetes medicines. This drug may also reduce the risk of heart attack or stroke if you have type 2 diabetes and risk factors for heart disease. This medicine may be used for other purposes; ask your health care provider or pharmacist if you have questions. COMMON BRAND NAME(S): Trulicity What should I tell my health care provider before I take this medicine? They need to know if you have any of these conditions:  endocrine tumors (MEN 2) or if someone in your family had these tumors  eye disease, vision problems  history of pancreatitis  kidney disease  liver disease  stomach problems  thyroid cancer or if someone in your family had thyroid cancer  an unusual or allergic reaction to dulaglutide, other medicines, foods, dyes, or preservatives  pregnant or trying to get pregnant  breast-feeding How should I use this medicine? This medicine is for injection under the skin of your upper leg (thigh), stomach area, or upper arm. It is usually given once every week (every 7 days). You will be taught how to prepare and give this medicine. Use exactly as directed. Take your medicine at regular intervals. Do not take it more often than directed. If you use this medicine with insulin, you should inject this medicine and the insulin separately. Do not mix them together. Do not give the injections right next to each other. Change (rotate) injection sites with each injection. It is important that you put your used needles and syringes in a special sharps container. Do not put them in a trash can. If you do not have a sharps container, call your pharmacist or healthcare provider to get one. A special MedGuide will be given to you by the pharmacist with each prescription and refill. Be sure to read this information  carefully each time. This drug comes with INSTRUCTIONS FOR USE. Ask your pharmacist for directions on how to use this drug. Read the information carefully. Talk to your pharmacist or health care provider if you have questions. Talk to your pediatrician regarding the use of this medicine in children. Special care may be needed. Overdosage: If you think you have taken too much of this medicine contact a poison control center or emergency room at once. NOTE: This medicine is only for you. Do not share this medicine with others. What if I miss a dose? If you miss a dose, take it as soon as you can within 3 days after the missed dose. Then take your next dose at your regular weekly time. If it has been longer than 3 days after the missed dose, do not take the missed dose. Take the next dose at your regular time. Do not take double or extra doses. If you have questions about a missed dose, contact your health care provider for advice. What may interact with this medicine?  other medicines for diabetes Many medications may cause changes in blood sugar, these include:  alcohol containing beverages  antiviral medicines for HIV or AIDS  aspirin and aspirin-like drugs  certain medicines for blood pressure, heart disease, irregular heart beat  chromium  diuretics  female hormones, such as estrogens or progestins, birth control pills  fenofibrate  gemfibrozil  isoniazid  lanreotide  female hormones or anabolic steroids  MAOIs like Carbex, Eldepryl, Marplan, Nardil, and Parnate  medicines for weight   loss  medicines for allergies, asthma, cold, or cough  medicines for depression, anxiety, or psychotic disturbances  niacin  nicotine  NSAIDs, medicines for pain and inflammation, like ibuprofen or naproxen  octreotide  pasireotide  pentamidine  phenytoin  probenecid  quinolone antibiotics such as ciprofloxacin, levofloxacin, ofloxacin  some herbal dietary  supplements  steroid medicines such as prednisone or cortisone  sulfamethoxazole; trimethoprim  thyroid hormones Some medications can hide the warning symptoms of low blood sugar (hypoglycemia). You may need to monitor your blood sugar more closely if you are taking one of these medications. These include:  beta-blockers, often used for high blood pressure or heart problems (examples include atenolol, metoprolol, propranolol)  clonidine  guanethidine  reserpine This list may not describe all possible interactions. Give your health care provider a list of all the medicines, herbs, non-prescription drugs, or dietary supplements you use. Also tell them if you smoke, drink alcohol, or use illegal drugs. Some items may interact with your medicine. What should I watch for while using this medicine? Visit your doctor or health care professional for regular checks on your progress. Drink plenty of fluids while taking this medicine. Check with your doctor or health care professional if you get an attack of severe diarrhea, nausea, and vomiting. The loss of too much body fluid can make it dangerous for you to take this medicine. A test called the HbA1C (A1C) will be monitored. This is a simple blood test. It measures your blood sugar control over the last 2 to 3 months. You will receive this test every 3 to 6 months. Learn how to check your blood sugar. Learn the symptoms of low and high blood sugar and how to manage them. Always carry a quick-source of sugar with you in case you have symptoms of low blood sugar. Examples include hard sugar candy or glucose tablets. Make sure others know that you can choke if you eat or drink when you develop serious symptoms of low blood sugar, such as seizures or unconsciousness. They must get medical help at once. Tell your doctor or health care professional if you have high blood sugar. You might need to change the dose of your medicine. If you are sick or  exercising more than usual, you might need to change the dose of your medicine. Do not skip meals. Ask your doctor or health care professional if you should avoid alcohol. Many nonprescription cough and cold products contain sugar or alcohol. These can affect blood sugar. Pens should never be shared. Even if the needle is changed, sharing may result in passing of viruses like hepatitis or HIV. Wear a medical ID bracelet or chain, and carry a card that describes your disease and details of your medicine and dosage times. What side effects may I notice from receiving this medicine? Side effects that you should report to your doctor or health care professional as soon as possible:  allergic reactions like skin rash, itching or hives, swelling of the face, lips, or tongue  breathing problems  changes in vision  diarrhea that continues or is severe  lump or swelling on the neck  severe nausea  signs and symptoms of infection like fever or chills; cough; sore throat; pain or trouble passing urine  signs and symptoms of low blood sugar such as feeling anxious, confusion, dizziness, increased hunger, unusually weak or tired, sweating, shakiness, cold, irritable, headache, blurred vision, fast heartbeat, loss of consciousness  signs and symptoms of kidney injury like trouble passing   urine or change in the amount of urine  trouble swallowing  unusual stomach upset or pain  vomiting Side effects that usually do not require medical attention (report to your doctor or health care professional if they continue or are bothersome):  diarrhea  loss of appetite  nausea  pain, redness, or irritation at site where injected  stomach upset This list may not describe all possible side effects. Call your doctor for medical advice about side effects. You may report side effects to FDA at 1-800-FDA-1088. Where should I keep my medicine? Keep out of the reach of children. Store unopened pens in a  refrigerator between 2 and 8 degrees C (36 and 46 degrees F). Do not freeze or use if the medicine has been frozen. Protect from light and excessive heat. Store in the carton until use. Each single-dose pen can be kept at room temperature, not to exceed 30 degrees C (86 degrees F) for a total of 14 days, if needed. Throw away any unused medicine after the expiration date on the label. NOTE: This sheet is a summary. It may not cover all possible information. If you have questions about this medicine, talk to your doctor, pharmacist, or health care provider.  2020 Elsevier/Gold Standard (2019-07-16 09:34:53)  

## 2020-07-30 LAB — BASIC METABOLIC PANEL
BUN/Creatinine Ratio: 11 (ref 9–23)
BUN: 25 mg/dL — ABNORMAL HIGH (ref 6–20)
CO2: 21 mmol/L (ref 20–29)
Calcium: 9 mg/dL (ref 8.7–10.2)
Chloride: 110 mmol/L — ABNORMAL HIGH (ref 96–106)
Creatinine, Ser: 2.22 mg/dL — ABNORMAL HIGH (ref 0.57–1.00)
GFR calc Af Amer: 31 mL/min/{1.73_m2} — ABNORMAL LOW (ref >59)
GFR calc non Af Amer: 27 mL/min/{1.73_m2} — ABNORMAL LOW (ref >59)
Glucose: 33 mg/dL — CL (ref 65–99)
Potassium: 3.8 mmol/L (ref 3.5–5.2)
Sodium: 145 mmol/L — ABNORMAL HIGH (ref 134–144)

## 2020-07-30 LAB — CBC WITH DIFFERENTIAL/PLATELET
Basophils Absolute: 0 10*3/uL (ref 0.0–0.2)
Basos: 1 %
EOS (ABSOLUTE): 0.5 10*3/uL — ABNORMAL HIGH (ref 0.0–0.4)
Eos: 6 %
Hematocrit: 34.1 % (ref 34.0–46.6)
Hemoglobin: 10.8 g/dL — ABNORMAL LOW (ref 11.1–15.9)
Immature Grans (Abs): 0 10*3/uL (ref 0.0–0.1)
Immature Granulocytes: 0 %
Lymphocytes Absolute: 2.4 10*3/uL (ref 0.7–3.1)
Lymphs: 27 %
MCH: 28.6 pg (ref 26.6–33.0)
MCHC: 31.7 g/dL (ref 31.5–35.7)
MCV: 91 fL (ref 79–97)
Monocytes Absolute: 0.7 10*3/uL (ref 0.1–0.9)
Monocytes: 8 %
Neutrophils Absolute: 5.2 10*3/uL (ref 1.4–7.0)
Neutrophils: 58 %
Platelets: 329 10*3/uL (ref 150–450)
RBC: 3.77 x10E6/uL (ref 3.77–5.28)
RDW: 14 % (ref 11.7–15.4)
WBC: 8.8 10*3/uL (ref 3.4–10.8)

## 2020-07-30 LAB — PRO B NATRIURETIC PEPTIDE: NT-Pro BNP: 1525 pg/mL — ABNORMAL HIGH (ref 0–130)

## 2020-08-05 ENCOUNTER — Institutional Professional Consult (permissible substitution): Payer: Medicaid Other | Admitting: Licensed Clinical Social Worker

## 2020-08-07 ENCOUNTER — Other Ambulatory Visit: Payer: Self-pay | Admitting: Family Medicine

## 2020-08-07 DIAGNOSIS — R112 Nausea with vomiting, unspecified: Secondary | ICD-10-CM

## 2020-08-07 NOTE — Telephone Encounter (Signed)
Requested medication (s) are due for refill today: Yes  Requested medication (s) are on the active medication list: Listed as Proventil  Last refill:  03/31/20  Future visit scheduled: Yes  Notes to clinic:  Listed as Proventil.    Requested Prescriptions  Pending Prescriptions Disp Refills   albuterol (VENTOLIN HFA) 108 (90 Base) MCG/ACT inhaler [Pharmacy Med Name: Albuterol Sulfate HFA 108 (90 Base) MCG/ACT Inhalation Aerosol Solution] 18 g 0    Sig: INHALE 1 TO 2 PUFFS BY MOUTH EVERY 6 HOURS AS NEEDED FOR WHEEZING FOR SHORTNESS OF BREATH      Pulmonology:  Beta Agonists Failed - 08/07/2020  7:36 AM      Failed - One inhaler should last at least one month. If the patient is requesting refills earlier, contact the patient to check for uncontrolled symptoms.      Passed - Valid encounter within last 12 months    Recent Outpatient Visits           1 week ago Type 2 diabetes mellitus with hyperglycemia, with long-term current use of insulin ([place])   [place], Charlane Ferretti, MD   7 months ago [place], MD   9 months ago Type 2 diabetes mellitus with hyperglycemia, with long-term current use of insulin ([place])   [place], Enobong, MD   10 months ago Night sweat   [place], Enobong, MD       Future Appointments             In 4 days Kathlen Mody, Blair Dolphin, PA-C [place], [facility]   In 3 weeks Charlott Rakes, MD [place]

## 2020-08-07 NOTE — Telephone Encounter (Signed)
Requested medication (s) are due for refill today: yes  Requested medication (s) are on the active medication list: yes  Last refill:  01/08/20  Future visit scheduled: yes  Notes to clinic: not delegated    Requested Prescriptions  Pending Prescriptions Disp Refills   ondansetron (ZOFRAN-ODT) 4 MG disintegrating tablet [Pharmacy Med Name: Ondansetron 4 MG Oral Tablet Disintegrating] 20 tablet 0    Sig: DISSOLVE 1 TABLET IN MOUTH EVERY 8 HOURS AS NEEDED FOR NAUSEA OR VOMITING      Not Delegated - Gastroenterology: Antiemetics Failed - 08/07/2020  7:36 AM      Failed - This refill cannot be delegated      Passed - Valid encounter within last 6 months    Recent Outpatient Visits           1 week ago Type 2 diabetes mellitus with hyperglycemia, with long-term current use of insulin ([place])   [place], Charlane Ferretti, MD   7 months ago [place], MD   9 months ago Type 2 diabetes mellitus with hyperglycemia, with long-term current use of insulin ([place])   [place], Charlane Ferretti, MD   10 months ago Night sweat   [place], Charlane Ferretti, MD       Future Appointments             In 4 days Kathlen Mody, Blair Dolphin, PA-C [place], [facility]   In 3 weeks Charlott Rakes, MD [place]
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
## 2020-08-11 ENCOUNTER — Ambulatory Visit: Payer: Self-pay | Admitting: Physician Assistant
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
## 2020-09-01 ENCOUNTER — Other Ambulatory Visit: Payer: Self-pay

## 2020-09-01 ENCOUNTER — Ambulatory Visit: Payer: BLUE CROSS/BLUE SHIELD | Attending: Family Medicine | Admitting: Family Medicine

## 2020-09-01 ENCOUNTER — Other Ambulatory Visit: Payer: Self-pay | Admitting: Family Medicine

## 2020-09-01 ENCOUNTER — Encounter: Payer: Self-pay | Admitting: Family Medicine

## 2020-09-01 DIAGNOSIS — I5022 Chronic systolic (congestive) heart failure: Secondary | ICD-10-CM

## 2020-09-01 DIAGNOSIS — G43109 Migraine with aura, not intractable, without status migrainosus: Secondary | ICD-10-CM

## 2020-09-01 DIAGNOSIS — I110 Hypertensive heart disease with heart failure: Secondary | ICD-10-CM

## 2020-09-01 DIAGNOSIS — N184 Chronic kidney disease, stage 4 (severe): Secondary | ICD-10-CM

## 2020-09-01 DIAGNOSIS — I129 Hypertensive chronic kidney disease with stage 1 through stage 4 chronic kidney disease, or unspecified chronic kidney disease: Secondary | ICD-10-CM

## 2020-09-01 DIAGNOSIS — E1122 Type 2 diabetes mellitus with diabetic chronic kidney disease: Secondary | ICD-10-CM | POA: Diagnosis not present

## 2020-09-01 DIAGNOSIS — R112 Nausea with vomiting, unspecified: Secondary | ICD-10-CM

## 2020-09-01 MED ORDER — TOPIRAMATE 50 MG PO TABS: 50.0000 mg | ORAL_TABLET | Freq: Two times a day (BID) | ORAL | 3 refills | Status: AC
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
## 2020-09-01 NOTE — Progress Notes (Signed)
Has been having chest pain  Waking up at night and not being able to breath.  Having fluid in legs.

## 2020-09-01 NOTE — Telephone Encounter (Signed)
Requested medication (s) are due for refill today:yes  Requested medication (s) are on the active medication list: yes  Last refill: 08/04/20  #20  0 refills  Future visit scheduled: yes  Notes to clinic:  Not delegated    Requested Prescriptions  Pending Prescriptions Disp Refills   ondansetron (ZOFRAN-ODT) 4 MG disintegrating tablet [Pharmacy Med Name: Ondansetron 4 MG Oral Tablet Disintegrating] 20 tablet 0    Sig: DISSOLVE 1 TABLET IN MOUTH EVERY 8 HOURS AS NEEDED FOR NAUSEA FOR VOMITING      Not Delegated - Gastroenterology: Antiemetics Failed - 09/01/2020  4:17 PM      Failed - This refill cannot be delegated      Passed - Valid encounter within last 6 months    Recent Outpatient Visits           Today Hypertensive heart disease with chronic systolic congestive heart failure ([place])   [place], [place], MD   1 month ago Type 2 diabetes mellitus with hyperglycemia, with long-term current use of insulin ([place])   [place], [place], MD   8 months ago [place], MD   10 months ago Type 2 diabetes mellitus with hyperglycemia, with long-term current use of insulin ([place])   [place], Charlane Ferretti, MD   11 months ago Night sweat   [place], Charlane Ferretti, MD       Future Appointments             In 3 days Tommie Raymond, NP [place], [facility]   In 1 month Charlott Rakes, MD [place]

## 2020-09-04 ENCOUNTER — Telehealth: Payer: Self-pay | Admitting: *Deleted

## 2020-09-04 ENCOUNTER — Other Ambulatory Visit: Payer: Self-pay

## 2020-09-04 ENCOUNTER — Encounter: Payer: Self-pay | Admitting: Cardiology

## 2020-09-04 ENCOUNTER — Other Ambulatory Visit: Payer: Self-pay | Admitting: *Deleted

## 2020-09-04 ENCOUNTER — Ambulatory Visit (INDEPENDENT_AMBULATORY_CARE_PROVIDER_SITE_OTHER): Payer: BLUE CROSS/BLUE SHIELD | Admitting: Cardiology

## 2020-09-04 VITALS — BP 118/80 | HR 109 | Ht 70.0 in | Wt 292.0 lb

## 2020-09-04 DIAGNOSIS — I5023 Acute on chronic systolic (congestive) heart failure: Secondary | ICD-10-CM | POA: Diagnosis not present

## 2020-09-04 DIAGNOSIS — I10 Essential (primary) hypertension: Secondary | ICD-10-CM

## 2020-09-04 DIAGNOSIS — N1832 Chronic kidney disease, stage 3b: Secondary | ICD-10-CM

## 2020-09-04 DIAGNOSIS — D649 Anemia, unspecified: Secondary | ICD-10-CM

## 2020-09-04 DIAGNOSIS — R079 Chest pain, unspecified: Secondary | ICD-10-CM

## 2020-09-04 DIAGNOSIS — I255 Ischemic cardiomyopathy: Secondary | ICD-10-CM

## 2020-09-04 DIAGNOSIS — I25119 Atherosclerotic heart disease of native coronary artery with unspecified angina pectoris: Secondary | ICD-10-CM | POA: Diagnosis not present

## 2020-09-04 LAB — BASIC METABOLIC PANEL
BUN/Creatinine Ratio: 8 — ABNORMAL LOW (ref 9–23)
BUN: 23 mg/dL — ABNORMAL HIGH (ref 6–20)
CO2: 22 mmol/L (ref 20–29)
Calcium: 8.6 mg/dL — ABNORMAL LOW (ref 8.7–10.2)
Chloride: 107 mmol/L — ABNORMAL HIGH (ref 96–106)
Creatinine, Ser: 2.89 mg/dL — ABNORMAL HIGH (ref 0.57–1.00)
GFR calc Af Amer: 23 mL/min/{1.73_m2} — ABNORMAL LOW (ref >59)
GFR calc non Af Amer: 20 mL/min/{1.73_m2} — ABNORMAL LOW (ref >59)
Glucose: 298 mg/dL — ABNORMAL HIGH (ref 65–99)
Potassium: 5.1 mmol/L (ref 3.5–5.2)
Sodium: 140 mmol/L (ref 134–144)

## 2020-09-04 MED ORDER — METOPROLOL TARTRATE 25 MG PO TABS: 12.5000 mg | ORAL_TABLET | Freq: Two times a day (BID) | ORAL | 11 refills | Status: AC

## 2020-09-04 MED ORDER — FUROSEMIDE 40 MG PO TABS
ORAL_TABLET | ORAL | 3 refills | Status: DC
Start: 2020-09-04 — End: 2020-09-04

## 2020-09-04 MED ORDER — ISOSORBIDE MONONITRATE ER 60 MG PO TB24: 60.0000 mg | ORAL_TABLET | Freq: Every day | ORAL | 3 refills | Status: AC

## 2020-09-04 MED ORDER — TORSEMIDE 20 MG PO TABS: ORAL_TABLET | ORAL | 5 refills | Status: AC

## 2020-09-04 NOTE — Addendum Note (Signed)
Addended by: Aris Georgia, Erskine Steinfeldt L on: 09/04/2020 04:19 PM   Modules accepted: Orders

## 2020-09-04 NOTE — Telephone Encounter (Signed)
Ann Drown, NP spoke with this patient and discussed med change from furosemide to torsemide 40 mg in the AM and 20 mg in the PM and also the need to order a calcium score. Patient agrees with this plan and is aware that her med list will be updated and the new rx for torsemide will be sent to her pharmacy. She will continue the furosemide today and pick the torsemide up from the pharmacy tomorrow. She also agrees with the calcium scoring and is aware that she will receive a call from our office next week to get this set up.

## 2020-09-04 NOTE — Telephone Encounter (Signed)
Edit to previous note: Patients phone was off and went to a generic voicemail when I called.

## 2020-09-04 NOTE — Patient Instructions (Signed)
Medication Instructions:   1.Stop the carvedilol 2.Start metoprolol tartrate 12.5 mg by mouth twice a day 3.We are changing the furosemide to 80 mg by mouth in the AM and 60 mg by mouth in the PM 4.Increase the isosorbide mononitrate to 60 mg by mouth daily  *If you need a refill on your cardiac medications before your next appointment, please call your pharmacy*   Lab Work:  BMET today  If you have labs (blood work) drawn today and your tests are completely normal, you will receive your results only by: Marland Kitchen MyChart Message (if you have MyChart) OR . A paper copy in the mail If you have any lab test that is abnormal or we need to change your treatment, we will call you to review the results.   Testing/Procedures: None   Follow-Up: At [facility], you and your health needs are our priority.  As part of our continuing mission to provide you with exceptional heart care, we have created designated Provider Care Teams.  These Care Teams include your primary Cardiologist (physician) and Advanced Practice Providers (APPs -  Physician Assistants and Nurse Practitioners) who all work together to provide you with the care you need, when you need it.   Your next appointment:   1-2 week(s)  The format for your next appointment:   In Person  Provider:   Kathyrn Drown, NP

## 2020-09-04 NOTE — Telephone Encounter (Signed)
Attempted to reach patient to discuss ordering cornary calcium score and changing the furosemide to torsemide but did not get an answer.

## 2020-09-04 NOTE — Addendum Note (Signed)
Addended by: Juventino Slovak on: 09/04/2020 04:48 PM   Modules accepted: Orders
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
## 2020-09-15 ENCOUNTER — Ambulatory Visit: Payer: BLUE CROSS/BLUE SHIELD | Admitting: Cardiology

## 2020-09-17 ENCOUNTER — Encounter: Payer: Self-pay | Admitting: *Deleted

## 2020-09-22 ENCOUNTER — Ambulatory Visit (INDEPENDENT_AMBULATORY_CARE_PROVIDER_SITE_OTHER)
Admission: RE | Admit: 2020-09-22 | Discharge: 2020-09-22 | Disposition: A | Discharge status: Home / Self Care | Payer: Self-pay | Source: Ambulatory Visit | Attending: Cardiology | Admitting: Cardiology

## 2020-09-22 ENCOUNTER — Other Ambulatory Visit: Payer: Self-pay

## 2020-09-22 DIAGNOSIS — R079 Chest pain, unspecified: Secondary | ICD-10-CM
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
## 2020-10-05 ENCOUNTER — Ambulatory Visit: Payer: BLUE CROSS/BLUE SHIELD | Admitting: Family Medicine

## 2020-10-06 ENCOUNTER — Ambulatory Visit: Payer: BLUE CROSS/BLUE SHIELD | Admitting: Nurse Practitioner

## 2020-10-13 ENCOUNTER — Telehealth: Payer: Self-pay

## 2020-10-13 NOTE — Telephone Encounter (Signed)
NOTES ON FILE FROM [facility] [phone], SENT REFERRAL TO SCHEDULING 

## 2020-10-19 ENCOUNTER — Inpatient Hospital Stay: Payer: BLUE CROSS/BLUE SHIELD | Admitting: Physician Assistant

## 2020-10-19 NOTE — Progress Notes (Deleted)
Patient ID: [name], female   DOB: Dec 30, 1980, 39 y.o.   MRN: [id number]   Admit date: 09/27/2020  Discharge date: 10/05/2020   Discharge Service: [facility] Hospitalist  Discharge Attending Physician:Eshwar Laveda Norman, MD  Discharge to: Home  Discharge Diagnoses: Principal Problem (Resolved): Acute on chronic respiratory failure with hypoxia and hypercapnia ([place]) Active Problems: AKI (acute kidney injury) ([place]) Anemia Resolved Problems: Pyelonephritis Hypomagnesemia Shortness of breath Chest pain syndrome  Hospital Course:  39 year old female with past medical history of diastolic CHF, diabetes mellitus type 2, hypertension, chronic anemia, CKD with baseline creatinine of 1.3, frequent UTIs [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 Complains of dry cough and wheezing with dyspnea on exertion. Afebrile, denies fever. Patient endorses PND/orthopnea. Complains of abdominal distention without nausea, vomiting. She is currently on torsemide for her CHF and has been compliant with her medications. She is fully vaccinated for COVID-19 pneumonia. Denies fever, chills, chest pain, dizziness, headaches, vomiting, dysuria.  In the ED, she is noted to be afebrile, heart rate elevated at 111 which is improved to 102, respiratory rate initially elevated 26 improved to 17, blood pressure stable, SPO2 100% on room air, labs show hemoglobin 8.6, creatinine elevated 2.67, blood glucose elevated to 53, BUN 28, bicarb 25, magnesium 1.5, EKG shows nonspecific ST-T changes with NSR, sinus tachycardia, troponin x112, proBNP elevated 668, HbA1c 7.5. She was given IV Lasix 20 mg x 1 and hospitalist consulted for further management. Patient was admitted to hospital and found following problems,  Acute hypoxic respiratory failure due to acute on chronic  systolic heart failure: improved significantly with IV Lasix infusion,continuing per renal team, patient has had chest pain,No evidence of ACS, troponins negative, D-dimer is elevated but VQ scan is negative,Per echo from 06/11/2020 her EF was 40 to 45%. Repeat 2D echo on 11/16 shows the same. had positive nuclear stress test in August 2021, exercise stress echo on 11/17 is abnormal, RHC/LHC on 11/18 shows multivessel disease including LAD, CTSthinks that she is a good surgical candidate, planning for CABG after Thanksgiving.cardiology watching her creatinine and symptoms while on Lasix drip, if she develops acute symptoms they may do PCI while she is here,otherwise will be discharged and will follow up with CT surgery team. Patient is not having any active symptoms at present and she feels okay. Patient will be discharged home and will follow with cardiothoracic surgery as scheduled.  Acute on chronic kidney disease stage IV Per review of records from care everywhere, her baseline creatinine is between 2.4-2.8 last creatinine on 09/04/2020 was 2.89,creatinine level is stable. Lasix has been put on the hold due to increasing creatinine level. Follow-up nephrology recommendations. Monitor kidney function closely. Nephrology has recommended to resume torsemide orally and follow-up outpatient next week to repeat BMP.  Acute Klebsiella UTI: Very mild symptoms, patient has completed IV antibiotics Rocephin.  Diabetes mellitus  HbA1c 7.5 we will resume home regimen of insulin and follow sugar level closely. Patient has been asked to follow with PCP for further adjustment of insulin dose.  Super morbid obesity her BMI is 44.62. I have counseled with patient regarding diet and exercise she needs to have sleep study done as outpatient  History of DVT in the past With IVC filter which was removed in 2010 not on any anticoagulation with her elevated D-dimer and bilateral lower extremity swelling,  obtained  ultrasound duplex, is negative for DVT  Hypertension: Stable, adjust the meds as needed.   Normocytic anemia. Likely secondary to CKD. Hemoglobin is stable. Monitor serial CBCs. Patient denies any active bleeding.   Patient is stable for discharge but she has a high potential for readmission due to multiple medical problems.  Post Discharge Follow Up Issues:  Patient is to follow with cardiothoracic surgery as scheduled. Patient is to follow with cardiology as scheduled. Patient is to follow with nephrology in 1 week. Patient needs repeat BMP at the time of follow-up. Patient needs to follow with PCP in 1 to 2 weeks. Patient is to arrange appointment. Patient was asked to come back to the ER if she has a chest pain/worsening shortness of breath.

## 2021-01-15 ENCOUNTER — Other Ambulatory Visit: Payer: Self-pay | Admitting: Family Medicine

## 2021-01-16 NOTE — Telephone Encounter (Signed)
Requested Prescriptions  Pending Prescriptions Disp Refills  . albuterol (VENTOLIN HFA) 108 (90 Base) MCG/ACT inhaler [Pharmacy Med Name: Albuterol Sulfate HFA 108 (90 Base) MCG/ACT Inhalation Aerosol Solution] 18 g 0    Sig: INHALE 1 TO 2 PUFFS BY MOUTH EVERY 6 HOURS AS NEEDED FOR WHEEZING FOR SHORTNESS OF BREATH     Pulmonology:  Beta Agonists Failed - 01/15/2021  6:19 PM      Failed - One inhaler should last at least one month. If the patient is requesting refills earlier, contact the patient to check for uncontrolled symptoms.      Passed - Valid encounter within last 12 months    Recent Outpatient Visits          4 months ago Hypertensive heart disease with chronic systolic congestive heart failure ([place])   [place], [place], MD   5 months ago Type 2 diabetes mellitus with hyperglycemia, with long-term current use of insulin ([place])   [place], Charlane Ferretti, MD   1 year ago [place], [place], MD   1 year ago Type 2 diabetes mellitus with hyperglycemia, with long-term current use of insulin ([place])   [place], [place], MD   1 year ago [place] sweat    [facility] Charlott Rakes, MD

## 2021-02-05 ENCOUNTER — Other Ambulatory Visit: Payer: Self-pay | Admitting: Family Medicine

## 2021-02-05 DIAGNOSIS — R112 Nausea with vomiting, unspecified: Secondary | ICD-10-CM

## 2021-02-05 NOTE — Telephone Encounter (Signed)
Requested medication (s) are due for refill today: no  Requested medication (s) are on the active medication list: yes  Last refill: 09/04/2020  Future visit scheduled: no  Notes to clinic:  this refill cannot be delegated    Requested Prescriptions  Pending Prescriptions Disp Refills   ondansetron (ZOFRAN-ODT) 4 MG disintegrating tablet [Pharmacy Med Name: Ondansetron 4 MG Oral Tablet Disintegrating] 20 tablet 0    Sig: DISSOLVE 1 TABLET IN MOUTH EVERY 8 HOURS AS NEEDED FOR NAUSEA FOR VOMITING      Not Delegated - Gastroenterology: Antiemetics Failed - 02/05/2021  9:43 AM      Failed - This refill cannot be delegated      Passed - Valid encounter within last 6 months    Recent Outpatient Visits           5 months ago Hypertensive heart disease with chronic systolic congestive heart failure ([place])   [place], [place], MD   6 months ago Type 2 diabetes mellitus with hyperglycemia, with long-term current use of insulin ([place])   [place], Charlane Ferretti, MD   1 year ago [place], MD   1 year ago Type 2 diabetes mellitus with hyperglycemia, with long-term current use of insulin ([place])   [place], [place], MD   1 year ago Night sweat   [place] Charlott Rakes, MD

## 2021-04-06 ENCOUNTER — Other Ambulatory Visit: Payer: Self-pay | Admitting: Family Medicine

## 2022-01-01 ENCOUNTER — Telehealth: Payer: BLUE CROSS/BLUE SHIELD

## 2022-04-20 NOTE — Addendum Note (Signed)
Addended by: Eileen Stanford on: 04/20/2022 05:11 AM   Modules accepted: Orders

## 2022-06-14 DIAGNOSIS — Z419 Encounter for procedure for purposes other than remedying health state, unspecified: Secondary | ICD-10-CM | POA: Diagnosis not present

## 2022-07-14 DIAGNOSIS — I5022 Chronic systolic (congestive) heart failure: Secondary | ICD-10-CM | POA: Diagnosis not present

## 2022-07-21 DIAGNOSIS — Z7689 Persons encountering health services in other specified circumstances: Secondary | ICD-10-CM | POA: Diagnosis not present
